# Patient Record
Sex: Female | Born: 1935 | Race: Black or African American | Hispanic: No | Marital: Married | Smoking: Never smoker
Health system: Southern US, Community
[De-identification: ages and names within clinical notes are randomized; demographics above are authoritative.]

## PROBLEM LIST (undated history)

## (undated) ENCOUNTER — Emergency Department (HOSPITAL_COMMUNITY): Admission: EM | Payer: Medicare Other | Source: Home / Self Care

## (undated) DIAGNOSIS — K5090 Crohn's disease, unspecified, without complications: Secondary | ICD-10-CM

## (undated) DIAGNOSIS — I2129 ST elevation (STEMI) myocardial infarction involving other sites: Secondary | ICD-10-CM

## (undated) DIAGNOSIS — F0280 Dementia in other diseases classified elsewhere without behavioral disturbance: Secondary | ICD-10-CM

## (undated) DIAGNOSIS — G309 Alzheimer's disease, unspecified: Secondary | ICD-10-CM

## (undated) DIAGNOSIS — G459 Transient cerebral ischemic attack, unspecified: Secondary | ICD-10-CM

## (undated) DIAGNOSIS — Z87442 Personal history of urinary calculi: Secondary | ICD-10-CM

## (undated) DIAGNOSIS — I10 Essential (primary) hypertension: Secondary | ICD-10-CM

## (undated) HISTORY — PX: TONSILLECTOMY: SUR1361

## (undated) HISTORY — PX: DILATION AND CURETTAGE OF UTERUS: SHX78

## (undated) HISTORY — PX: LAPAROSCOPIC RIGHT HEMI COLECTOMY: SHX5926

## (undated) HISTORY — PX: COLECTOMY: SHX59

---

## 1998-10-05 ENCOUNTER — Ambulatory Visit: Admission: RE | Admit: 1998-10-05 | Discharge: 1998-10-05 | Payer: Self-pay | Admitting: Cardiology

## 1999-04-28 ENCOUNTER — Emergency Department (HOSPITAL_COMMUNITY): Admission: EM | Admit: 1999-04-28 | Discharge: 1999-04-28 | Payer: Self-pay | Admitting: Emergency Medicine

## 2000-06-12 ENCOUNTER — Emergency Department (HOSPITAL_COMMUNITY): Admission: EM | Admit: 2000-06-12 | Discharge: 2000-06-12 | Payer: Self-pay | Admitting: Emergency Medicine

## 2000-06-12 ENCOUNTER — Encounter: Payer: Self-pay | Admitting: Emergency Medicine

## 2000-07-26 ENCOUNTER — Encounter: Payer: Self-pay | Admitting: Cardiology

## 2000-07-26 ENCOUNTER — Ambulatory Visit (HOSPITAL_COMMUNITY): Admission: RE | Admit: 2000-07-26 | Discharge: 2000-07-26 | Payer: Self-pay | Admitting: Cardiology

## 2000-08-30 ENCOUNTER — Observation Stay (HOSPITAL_COMMUNITY): Admission: EM | Admit: 2000-08-30 | Discharge: 2000-08-31 | Payer: Self-pay | Admitting: Emergency Medicine

## 2000-08-30 ENCOUNTER — Encounter: Payer: Self-pay | Admitting: Emergency Medicine

## 2000-08-31 ENCOUNTER — Encounter: Payer: Self-pay | Admitting: Cardiology

## 2001-07-24 ENCOUNTER — Encounter: Payer: Self-pay | Admitting: General Surgery

## 2001-07-24 ENCOUNTER — Encounter: Admission: RE | Admit: 2001-07-24 | Discharge: 2001-07-24 | Payer: Self-pay | Admitting: General Surgery

## 2001-08-01 ENCOUNTER — Encounter (INDEPENDENT_AMBULATORY_CARE_PROVIDER_SITE_OTHER): Payer: Self-pay | Admitting: *Deleted

## 2001-08-01 ENCOUNTER — Ambulatory Visit (HOSPITAL_COMMUNITY): Admission: RE | Admit: 2001-08-01 | Discharge: 2001-08-01 | Payer: Self-pay | Admitting: *Deleted

## 2002-03-09 ENCOUNTER — Emergency Department (HOSPITAL_COMMUNITY): Admission: EM | Admit: 2002-03-09 | Discharge: 2002-03-09 | Payer: Self-pay | Admitting: *Deleted

## 2002-03-09 ENCOUNTER — Encounter: Payer: Self-pay | Admitting: Emergency Medicine

## 2003-12-09 ENCOUNTER — Encounter: Admission: RE | Admit: 2003-12-09 | Discharge: 2003-12-09 | Payer: Self-pay | Admitting: Cardiology

## 2004-06-22 ENCOUNTER — Ambulatory Visit (HOSPITAL_COMMUNITY): Admission: RE | Admit: 2004-06-22 | Discharge: 2004-06-22 | Payer: Self-pay | Admitting: Cardiology

## 2005-06-01 ENCOUNTER — Encounter: Admission: RE | Admit: 2005-06-01 | Discharge: 2005-06-01 | Payer: Self-pay | Admitting: Cardiology

## 2005-07-12 ENCOUNTER — Encounter: Admission: RE | Admit: 2005-07-12 | Discharge: 2005-07-12 | Payer: Self-pay | Admitting: Cardiology

## 2006-09-10 ENCOUNTER — Emergency Department (HOSPITAL_COMMUNITY): Admission: EM | Admit: 2006-09-10 | Discharge: 2006-09-10 | Payer: Self-pay | Admitting: Emergency Medicine

## 2007-03-27 ENCOUNTER — Encounter: Admission: RE | Admit: 2007-03-27 | Discharge: 2007-03-27 | Payer: Self-pay | Admitting: Cardiology

## 2007-09-22 ENCOUNTER — Emergency Department (HOSPITAL_COMMUNITY): Admission: EM | Admit: 2007-09-22 | Discharge: 2007-09-22 | Payer: Self-pay | Admitting: Family Medicine

## 2007-09-29 ENCOUNTER — Encounter: Admission: RE | Admit: 2007-09-29 | Discharge: 2007-09-29 | Payer: Self-pay | Admitting: Cardiology

## 2008-10-15 HISTORY — PX: ESOPHAGOGASTRODUODENOSCOPY: SHX1529

## 2008-11-03 ENCOUNTER — Inpatient Hospital Stay (HOSPITAL_COMMUNITY): Admission: EM | Admit: 2008-11-03 | Discharge: 2008-11-08 | Payer: Self-pay | Admitting: Emergency Medicine

## 2009-03-31 ENCOUNTER — Encounter: Admission: RE | Admit: 2009-03-31 | Discharge: 2009-03-31 | Payer: Self-pay | Admitting: Cardiology

## 2009-12-29 ENCOUNTER — Encounter: Admission: RE | Admit: 2009-12-29 | Discharge: 2009-12-29 | Payer: Self-pay | Admitting: Cardiology

## 2010-10-23 ENCOUNTER — Emergency Department (HOSPITAL_COMMUNITY)
Admission: EM | Admit: 2010-10-23 | Discharge: 2010-10-23 | Payer: Self-pay | Source: Home / Self Care | Admitting: Emergency Medicine

## 2010-10-30 LAB — URINALYSIS, ROUTINE W REFLEX MICROSCOPIC
Bilirubin Urine: NEGATIVE
Hgb urine dipstick: NEGATIVE
Ketones, ur: NEGATIVE mg/dL
Nitrite: NEGATIVE
Protein, ur: NEGATIVE mg/dL
Specific Gravity, Urine: 1.021 (ref 1.005–1.030)
Urine Glucose, Fasting: NEGATIVE mg/dL
Urobilinogen, UA: 0.2 mg/dL (ref 0.0–1.0)
pH: 5.5 (ref 5.0–8.0)

## 2010-10-30 LAB — URINE MICROSCOPIC-ADD ON

## 2010-10-30 LAB — CBC
HCT: 34.4 % — ABNORMAL LOW (ref 36.0–46.0)
Hemoglobin: 11.5 g/dL — ABNORMAL LOW (ref 12.0–15.0)
MCH: 29.2 pg (ref 26.0–34.0)
MCHC: 33.4 g/dL (ref 30.0–36.0)
MCV: 87.3 fL (ref 78.0–100.0)
Platelets: 143 10*3/uL — ABNORMAL LOW (ref 150–400)
RBC: 3.94 MIL/uL (ref 3.87–5.11)
RDW: 15.5 % (ref 11.5–15.5)
WBC: 5.1 10*3/uL (ref 4.0–10.5)

## 2010-10-30 LAB — COMPREHENSIVE METABOLIC PANEL
ALT: 14 U/L (ref 0–35)
AST: 27 U/L (ref 0–37)
Albumin: 3.8 g/dL (ref 3.5–5.2)
Alkaline Phosphatase: 72 U/L (ref 39–117)
BUN: 12 mg/dL (ref 6–23)
CO2: 22 mEq/L (ref 19–32)
Calcium: 9.6 mg/dL (ref 8.4–10.5)
Chloride: 110 mEq/L (ref 96–112)
Creatinine, Ser: 0.88 mg/dL (ref 0.4–1.2)
GFR calc Af Amer: >60 mL/min (ref >60)
GFR calc non Af Amer: >60 mL/min (ref >60)
Glucose, Bld: 81 mg/dL (ref 70–99)
Potassium: 3.6 mEq/L (ref 3.5–5.1)
Sodium: 142 mEq/L (ref 135–145)
Total Bilirubin: 1.1 mg/dL (ref 0.3–1.2)
Total Protein: 6.5 g/dL (ref 6.0–8.3)

## 2010-10-30 LAB — DIFFERENTIAL
Basophils Absolute: 0 10*3/uL (ref 0.0–0.1)
Basophils Relative: 0 % (ref 0–1)
Eosinophils Absolute: 0 10*3/uL (ref 0.0–0.7)
Eosinophils Relative: 1 % (ref 0–5)
Lymphocytes Relative: 35 % (ref 12–46)
Lymphs Abs: 1.8 10*3/uL (ref 0.7–4.0)
Monocytes Absolute: 0.3 10*3/uL (ref 0.1–1.0)
Monocytes Relative: 7 % (ref 3–12)
Neutro Abs: 2.9 10*3/uL (ref 1.7–7.7)
Neutrophils Relative %: 57 % (ref 43–77)

## 2010-10-30 LAB — POCT CARDIAC MARKERS
CKMB, poc: <1 ng/mL — ABNORMAL LOW (ref 1.0–8.0)
Myoglobin, poc: 87.2 ng/mL (ref 12–200)
Troponin i, poc: 0.07 ng/mL (ref 0.00–0.09)

## 2010-10-30 LAB — LIPASE, BLOOD: Lipase: 37 U/L (ref 11–59)

## 2010-11-08 ENCOUNTER — Encounter
Admission: RE | Admit: 2010-11-08 | Discharge: 2010-11-08 | Payer: Self-pay | Source: Home / Self Care | Attending: Gastroenterology | Admitting: Gastroenterology

## 2011-01-16 ENCOUNTER — Ambulatory Visit
Admission: RE | Admit: 2011-01-16 | Discharge: 2011-01-16 | Disposition: A | Discharge status: Home / Self Care | Payer: Medicare Other | Source: Ambulatory Visit | Attending: Cardiology | Admitting: Cardiology

## 2011-01-16 ENCOUNTER — Other Ambulatory Visit: Payer: Self-pay | Admitting: Cardiology

## 2011-01-16 DIAGNOSIS — R55 Syncope and collapse: Secondary | ICD-10-CM

## 2011-01-19 ENCOUNTER — Other Ambulatory Visit: Payer: Medicare Other

## 2011-01-29 LAB — CBC
HCT: 29.9 % — ABNORMAL LOW (ref 36.0–46.0)
HCT: 32.6 % — ABNORMAL LOW (ref 36.0–46.0)
HCT: 32.9 % — ABNORMAL LOW (ref 36.0–46.0)
Hemoglobin: 10 g/dL — ABNORMAL LOW (ref 12.0–15.0)
Hemoglobin: 10.1 g/dL — ABNORMAL LOW (ref 12.0–15.0)
Hemoglobin: 10.7 g/dL — ABNORMAL LOW (ref 12.0–15.0)
Hemoglobin: 11 g/dL — ABNORMAL LOW (ref 12.0–15.0)
MCHC: 33.1 g/dL (ref 30.0–36.0)
MCHC: 33.6 g/dL (ref 30.0–36.0)
MCV: 90.6 fL (ref 78.0–100.0)
MCV: 90.9 fL (ref 78.0–100.0)
MCV: 91.3 fL (ref 78.0–100.0)
Platelets: 133 10*3/uL — ABNORMAL LOW (ref 150–400)
Platelets: 138 10*3/uL — ABNORMAL LOW (ref 150–400)
Platelets: 151 10*3/uL (ref 150–400)
RBC: 3.63 MIL/uL — ABNORMAL LOW (ref 3.87–5.11)
RDW: 14.4 % (ref 11.5–15.5)
RDW: 14.7 % (ref 11.5–15.5)
RDW: 15.1 % (ref 11.5–15.5)
WBC: 4.3 10*3/uL (ref 4.0–10.5)
WBC: 5.2 10*3/uL (ref 4.0–10.5)
WBC: 6.2 10*3/uL (ref 4.0–10.5)

## 2011-01-29 LAB — URINALYSIS, ROUTINE W REFLEX MICROSCOPIC
Bilirubin Urine: NEGATIVE
Ketones, ur: 15 mg/dL — AB
Nitrite: NEGATIVE
Specific Gravity, Urine: 1.02 (ref 1.005–1.030)
Urobilinogen, UA: 0.2 mg/dL (ref 0.0–1.0)

## 2011-01-29 LAB — APTT: aPTT: 29 seconds (ref 24–37)

## 2011-01-29 LAB — BASIC METABOLIC PANEL
CO2: 25 mEq/L (ref 19–32)
Calcium: 8.7 mg/dL (ref 8.4–10.5)
Calcium: 8.7 mg/dL (ref 8.4–10.5)
Creatinine, Ser: 0.97 mg/dL (ref 0.4–1.2)
GFR calc Af Amer: >60 mL/min (ref >60)
GFR calc non Af Amer: 57 mL/min — ABNORMAL LOW (ref >60)
GFR calc non Af Amer: >60 mL/min (ref >60)
GFR calc non Af Amer: >60 mL/min (ref >60)
Glucose, Bld: 90 mg/dL (ref 70–99)
Glucose, Bld: 98 mg/dL (ref 70–99)
Potassium: 3.6 mEq/L (ref 3.5–5.1)
Sodium: 141 mEq/L (ref 135–145)
Sodium: 144 mEq/L (ref 135–145)

## 2011-01-29 LAB — COMPREHENSIVE METABOLIC PANEL
ALT: 12 U/L (ref 0–35)
AST: 24 U/L (ref 0–37)
Albumin: 3.4 g/dL — ABNORMAL LOW (ref 3.5–5.2)
CO2: 22 mEq/L (ref 19–32)
Chloride: 106 mEq/L (ref 96–112)
Creatinine, Ser: 1.21 mg/dL — ABNORMAL HIGH (ref 0.4–1.2)
GFR calc Af Amer: 53 mL/min — ABNORMAL LOW (ref >60)
GFR calc non Af Amer: 44 mL/min — ABNORMAL LOW (ref >60)
Sodium: 139 mEq/L (ref 135–145)
Total Bilirubin: 1.1 mg/dL (ref 0.3–1.2)

## 2011-01-29 LAB — URINE MICROSCOPIC-ADD ON

## 2011-01-29 LAB — DIFFERENTIAL
Basophils Absolute: 0 10*3/uL (ref 0.0–0.1)
Eosinophils Relative: 1 % (ref 0–5)
Lymphocytes Relative: 30 % (ref 12–46)
Lymphs Abs: 1.5 10*3/uL (ref 0.7–4.0)
Neutro Abs: 3.1 10*3/uL (ref 1.7–7.7)
Neutrophils Relative %: 60 % (ref 43–77)

## 2011-01-29 LAB — TYPE AND SCREEN: ABO/RH(D): O POS

## 2011-01-29 LAB — ABO/RH: ABO/RH(D): O POS

## 2011-02-12 ENCOUNTER — Other Ambulatory Visit: Payer: Self-pay | Admitting: Gastroenterology

## 2011-02-14 ENCOUNTER — Ambulatory Visit
Admission: RE | Admit: 2011-02-14 | Discharge: 2011-02-14 | Disposition: A | Discharge status: Home / Self Care | Payer: Medicare Other | Source: Ambulatory Visit | Attending: Gastroenterology | Admitting: Gastroenterology

## 2011-02-14 MED ORDER — IOHEXOL 300 MG/ML  SOLN
100.0000 mL | Freq: Once | INTRAMUSCULAR | Status: AC | PRN
  Administered 2011-02-14: 100 mL via INTRAVENOUS

## 2011-02-27 NOTE — Op Note (Signed)
[name], CURVIN                ACCOUNT NO.:  [id number]   MEDICAL RECORD NO.:  [id number]          PATIENT TYPE:  INP   LOCATION:  7672                         FACILITY:  [facility]   PHYSICIAN:  Wonda Horner, M.D.   DATE OF BIRTH:  1935/03/02   DATE OF PROCEDURE:  11/04/2008  DATE OF DISCHARGE:                               OPERATIVE REPORT   INDICATIONS:  Epigastric pain, GI bleeding.   Informed consent was obtained after explanation of the risks of  bleeding, infection, and perforation.   PREMEDICATION:  Fentanyl 50 mcg IV and Versed 4 mg IV.   PROCEDURE IN DETAIL:  With the patient in the left lateral decubitus  position, the Pentax gastroscope was inserted into the oropharynx and  passed into the esophagus.  It was advanced down the esophagus, then  into the stomach and into the duodenum.  The second portion and bulb of  the duodenum looked normal.  The stomach looked normal in its entirety.  The esophagus looked normal in its entirety.  The esophagogastric  junction was at 37 cm.  She tolerated the procedure well without  complications.   IMPRESSION:  Normal esophagogastroduodenoscopy.   There is nothing on this exam to explain her gastrointestinal bleeding.  I will schedule her for a colonoscopy.           ______________________________  Wonda Horner, M.D.     SFG/MEDQ  D:  11/04/2008  T:  11/04/2008  Job:  [id number]   cc:   Ardyth Gal. Spruill, M.D.

## 2011-02-27 NOTE — Op Note (Signed)
[name], [name]                ACCOUNT NO.:  [id number]   MEDICAL RECORD NO.:  [id number]          PATIENT TYPE:  INP   LOCATION:  2542                         FACILITY:  [facility]   PHYSICIAN:  Wonda Horner, M.D.   DATE OF BIRTH:  [birth year]   DATE OF PROCEDURE:  11/05/2008  DATE OF DISCHARGE:                               OPERATIVE REPORT   INDICATIONS FOR PROCEDURE:  Gastrointestinal bleeding.   Informed consent was obtained after explanation of the risks of  bleeding, infection, and perforation.   PREMEDICATION:  Fentanyl 100 mcg IV and Versed 6 mg IV.   PROCEDURE:  With the patient in the left lateral decubitus position, a  rectal exam was performed and no masses were felt.  The Pentax  colonoscope was inserted into the rectum and advanced into the sigmoid  colon.  The left colon was very tortuous.  The colon was looped.  The  patient has had prior right hemicolectomy back in the 1970s.  I suspect  that she has adhesions.  The scope was advanced up into the descending  colon.  I saw one little diverticulum in the region of the sigmoid, but  none in the descending colon.  The transverse colon seemed to loop down  and I could not advance the scope all the way to the anastomosis site.  She tolerated the procedure well without complications.   IMPRESSION:  Diverticulum.  No evidence of active bleeding.   PLAN:  We will obtain a barium enema to evaluate the rest of the colon.           ______________________________  Wonda Horner, M.D.     SFG/MEDQ  D:  11/05/2008  T:  11/06/2008  Job:  [id number]   cc:   Ardyth Gal. Spruill, M.D.

## 2011-02-27 NOTE — Consult Note (Signed)
NAME:  [name], [name] NO.:  [id number]   MEDICAL RECORD NO.:  [id number]          PATIENT TYPE:  INP   LOCATION:  3568                         FACILITY:  [facility]   PHYSICIAN:  Wonda Horner, M.D.   DATE OF BIRTH:  [birth year]   DATE OF CONSULTATION:  11/03/2008  DATE OF DISCHARGE:                                 CONSULTATION   REASON FOR CONSULTATION:  The patient is a 76 year old black female with  complaints of epigastric abdominal pain for the past 2 weeks.  She was  put on Prevacid a couple of weeks ago for this pain and it helped for a  while, but then the pain started to come back.  Today, she experienced  dark-colored stool with some reddish blood also, and she came to the  emergency room where she was seen by Dr. Montez Morita and is being admitted.   Her hemoglobin and hematocrit are 11.5 and 34.2 respectively.   She has not vomited any blood.   PAST MEDICAL HISTORY:  1. Hypertension.  2. Remote history of Crohn disease with a right colectomy done in      1972, no treatment since.  3. History of gastritis.   ALLERGIES:  SULFA and CODEINE.   SOCIAL HISTORY:  No history of alcohol or smoking.   REVIEW OF SYSTEMS:  No chest pain, shortness of breath, cough, or sputum  production.   PHYSICAL EXAMINATION:  VITAL SIGNS:  Stable.  GENERAL:  She is in no distress.  NECK:  Supple.  HEART:  Regular rhythm.  No murmurs.  LUNGS:  Clear.  ABDOMEN:  Soft.  There is some tenderness in the epigastrium.  There is  no rebound or guarding.  No hepatosplenomegaly.   IMPRESSION:  1. Gastrointestinal bleeding.  I suspect that this is of an upper      source.  2. Epigastric pain, rule out peptic ulcer.   PLAN:  The patient is being admitted.  We will start her on IV Protonix.  I will plan EGD tomorrow.           ______________________________  Wonda Horner, M.D.     SFG/MEDQ  D:  11/03/2008  T:  11/04/2008  Job:  [id number]   cc:   Ardyth Gal. Spruill, M.D.

## 2011-03-21 ENCOUNTER — Other Ambulatory Visit (HOSPITAL_COMMUNITY): Payer: Self-pay | Admitting: Obstetrics

## 2011-03-21 DIAGNOSIS — N83209 Unspecified ovarian cyst, unspecified side: Secondary | ICD-10-CM

## 2011-03-22 ENCOUNTER — Ambulatory Visit (HOSPITAL_COMMUNITY)
Admission: RE | Admit: 2011-03-22 | Discharge: 2011-03-22 | Disposition: A | Discharge status: Home / Self Care | Payer: Medicare Other | Source: Ambulatory Visit | Attending: Obstetrics | Admitting: Obstetrics

## 2011-03-22 DIAGNOSIS — N83209 Unspecified ovarian cyst, unspecified side: Secondary | ICD-10-CM | POA: Insufficient documentation

## 2011-03-22 DIAGNOSIS — N949 Unspecified condition associated with female genital organs and menstrual cycle: Secondary | ICD-10-CM | POA: Insufficient documentation

## 2011-03-22 LAB — CREATININE, SERUM: GFR calc non Af Amer: >60 mL/min (ref >60)

## 2011-03-22 MED ORDER — GADOBENATE DIMEGLUMINE 529 MG/ML IV SOLN
9.0000 mL | Freq: Once | INTRAVENOUS | Status: AC | PRN
  Administered 2011-03-22: 9 mL via INTRAVENOUS

## 2012-02-19 ENCOUNTER — Encounter (HOSPITAL_COMMUNITY): Payer: Self-pay | Admitting: *Deleted

## 2012-02-19 ENCOUNTER — Emergency Department (HOSPITAL_COMMUNITY)
Admission: EM | Admit: 2012-02-19 | Discharge: 2012-02-19 | Disposition: A | Discharge status: Home / Self Care | Payer: Medicare Other | Attending: Emergency Medicine | Admitting: Emergency Medicine

## 2012-02-19 ENCOUNTER — Emergency Department (HOSPITAL_COMMUNITY): Payer: Medicare Other

## 2012-02-19 DIAGNOSIS — R5381 Other malaise: Secondary | ICD-10-CM | POA: Insufficient documentation

## 2012-02-19 DIAGNOSIS — R51 Headache: Secondary | ICD-10-CM | POA: Insufficient documentation

## 2012-02-19 DIAGNOSIS — G319 Degenerative disease of nervous system, unspecified: Secondary | ICD-10-CM | POA: Insufficient documentation

## 2012-02-19 DIAGNOSIS — I771 Stricture of artery: Secondary | ICD-10-CM | POA: Insufficient documentation

## 2012-02-19 DIAGNOSIS — M436 Torticollis: Secondary | ICD-10-CM | POA: Insufficient documentation

## 2012-02-19 DIAGNOSIS — R110 Nausea: Secondary | ICD-10-CM | POA: Insufficient documentation

## 2012-02-19 DIAGNOSIS — R42 Dizziness and giddiness: Secondary | ICD-10-CM | POA: Insufficient documentation

## 2012-02-19 DIAGNOSIS — I10 Essential (primary) hypertension: Secondary | ICD-10-CM | POA: Insufficient documentation

## 2012-02-19 DIAGNOSIS — Z79899 Other long term (current) drug therapy: Secondary | ICD-10-CM | POA: Insufficient documentation

## 2012-02-19 HISTORY — DX: Essential (primary) hypertension: I10

## 2012-02-19 HISTORY — DX: Crohn's disease, unspecified, without complications: K50.90

## 2012-02-19 LAB — BASIC METABOLIC PANEL
CO2: 23 mEq/L (ref 19–32)
Calcium: 9.6 mg/dL (ref 8.4–10.5)
Chloride: 107 mEq/L (ref 96–112)
Creatinine, Ser: 0.87 mg/dL (ref 0.50–1.10)
Glucose, Bld: 79 mg/dL (ref 70–99)

## 2012-02-19 LAB — CBC
HCT: 33.8 % — ABNORMAL LOW (ref 36.0–46.0)
Hemoglobin: 11.6 g/dL — ABNORMAL LOW (ref 12.0–15.0)
MCV: 88.9 fL (ref 78.0–100.0)
RBC: 3.8 MIL/uL — ABNORMAL LOW (ref 3.87–5.11)
WBC: 5.7 10*3/uL (ref 4.0–10.5)

## 2012-02-19 LAB — URINALYSIS, ROUTINE W REFLEX MICROSCOPIC
Bilirubin Urine: NEGATIVE
Glucose, UA: NEGATIVE mg/dL
Ketones, ur: NEGATIVE mg/dL
Leukocytes, UA: NEGATIVE
Nitrite: NEGATIVE
Specific Gravity, Urine: 1.015 (ref 1.005–1.030)
pH: 5.5 (ref 5.0–8.0)

## 2012-02-19 LAB — DIFFERENTIAL
Basophils Absolute: 0 10*3/uL (ref 0.0–0.1)
Eosinophils Relative: 1 % (ref 0–5)
Lymphocytes Relative: 33 % (ref 12–46)
Lymphs Abs: 1.9 10*3/uL (ref 0.7–4.0)
Monocytes Absolute: 0.4 10*3/uL (ref 0.1–1.0)
Monocytes Relative: 7 % (ref 3–12)
Neutro Abs: 3.4 10*3/uL (ref 1.7–7.7)

## 2012-02-19 LAB — SEDIMENTATION RATE: Sed Rate: 16 mm/hr (ref 0–22)

## 2012-02-19 MED ORDER — IOHEXOL 350 MG/ML SOLN
50.0000 mL | Freq: Once | INTRAVENOUS | Status: AC | PRN
  Administered 2012-02-19: 50 mL via INTRAVENOUS

## 2012-02-19 MED ORDER — METOCLOPRAMIDE HCL 5 MG/ML IJ SOLN
10.0000 mg | Freq: Once | INTRAMUSCULAR | Status: AC
  Administered 2012-02-19: 10 mg via INTRAVENOUS
  Filled 2012-02-19: qty 2

## 2012-02-19 MED ORDER — DIPHENHYDRAMINE HCL 50 MG/ML IJ SOLN
25.0000 mg | Freq: Once | INTRAMUSCULAR | Status: AC
  Administered 2012-02-19: 25 mg via INTRAVENOUS
  Filled 2012-02-19: qty 1

## 2012-02-19 NOTE — ED Notes (Signed)
Pt ambulates app 100 yards in NAD. Pt ambulates without assistance, but statesshe feels she is "veering to the left" EDP aware

## 2012-02-19 NOTE — ED Provider Notes (Signed)
History     CSN: [id number]  Arrival date & time 02/19/12  1139   First MD Initiated Contact with Patient 02/19/12 1325      Chief Complaint  Patient presents with  . Headache  . Nausea  . Weakness  . Dizziness    (Consider location/radiation/quality/duration/timing/severity/associated sxs/prior treatment) HPI history present illness chief complaint: headache. Patient arrived by private vehicle. History provided by patient. No language barriers identified. Information not limited. Onset of symptoms 9:30 AM. Location of pain generalized head. Symptoms improving spontaneously and not worsened by anything. Quality dull. Radiation none. Severity moderate to severe. Timing constant. Duration since 9:30 AM. Context the patient states this is possibly the worst headache of her life. Patient denies any recent trauma. For associated signs and symptoms please refer to the review of systems. No treatments prior prior to arrival. No recent medical care. Regarding patient's social history please refer to the nurse's notes. No family history for stroke. Positive family history of glioblastoma. I reviewed patient's past medical, past surgical, social history as well as medications and allergies per  Past Medical History  Diagnosis Date  . Hypertension   . Crohn disease     Past Surgical History  Procedure Date  . Colectomy     No family history on file.  History  Substance Use Topics  . Smoking status: Never Smoker   . Smokeless tobacco: Not on file  . Alcohol Use: No    OB History    Grav Para Term Preterm Abortions TAB SAB Ect Mult Living                  Review of Systems  Constitutional: Negative for fever and chills.  HENT: Positive for neck stiffness. Negative for trouble swallowing and neck pain.   Eyes: Negative for pain, discharge and itching.  Respiratory: Negative for cough, chest tightness and shortness of breath.   Cardiovascular: Negative for chest pain, palpitations  and leg swelling.  Gastrointestinal: Positive for nausea. Negative for vomiting, abdominal pain, diarrhea, constipation and blood in stool.  Genitourinary: Negative for dysuria, urgency, frequency, hematuria, flank pain, decreased urine volume, difficulty urinating and pelvic pain.  Musculoskeletal: Negative for back pain and joint swelling.  Skin: Negative for rash and wound.  Neurological: Positive for headaches. Negative for dizziness, tremors, seizures, syncope, facial asymmetry, speech difficulty, weakness, light-headedness and numbness.  Hematological: Negative for adenopathy. Does not bruise/bleed easily.  Psychiatric/Behavioral: Negative for confusion and decreased concentration.    Allergies  Sulfa antibiotics and Codeine  Home Medications   Current Outpatient Rx  Name Route Sig Dispense Refill  . AMLODIPINE BESYLATE 5 MG PO TABS Oral Take 5 mg by mouth daily.    . ASPIRIN 81 MG PO CHEW Oral Chew 81 mg by mouth daily.      BP 173/91  Pulse 74  Temp(Src) 97.9 F (36.6 C) (Oral)  Resp 14  Ht 5' 3"  (1.6 m)  Wt 105 lb (47.628 kg)  BMI 18.60 kg/m2  SpO2 100%  Physical Exam  Constitutional: She is oriented to person, place, and time. She appears well-developed and well-nourished. No distress.  HENT:  Head: Normocephalic and atraumatic.  Eyes: Conjunctivae and EOM are normal. Pupils are equal, round, and reactive to light. Right eye exhibits no discharge. Left eye exhibits no discharge. No scleral icterus.  Neck: Normal range of motion. Neck supple.  Cardiovascular: Normal rate, regular rhythm, normal heart sounds and intact distal pulses.   No murmur heard. Pulmonary/Chest: Effort  normal and breath sounds normal. No respiratory distress. She has no wheezes. She has no rales. She exhibits no tenderness.  Abdominal: Soft. Bowel sounds are normal. She exhibits no distension and no mass. There is no tenderness. There is no rebound and no guarding.  Musculoskeletal: Normal  range of motion. She exhibits no tenderness.  Neurological: She is alert and oriented to person, place, and time. She has normal strength. No cranial nerve deficit or sensory deficit. Coordination and gait normal. GCS eye subscore is 4. GCS verbal subscore is 5. GCS motor subscore is 6.       Patient not cooperative with head impulse testing. No bidirectional nystagmus. No skewed deviation with cover testing. Finger to nose testing normal. Alternating hand movements is normal. Heel-to-shin testing normal.  Skin: Skin is warm and dry. She is not diaphoretic.  Psychiatric: She has a normal mood and affect.    ED Course  Procedures (including critical care time)  Labs Reviewed  CBC - Abnormal; Notable for the following:    RBC 3.80 (*)    Hemoglobin 11.6 (*)    HCT 33.8 (*)    Platelets 142 (*)    All other components within normal limits  BASIC METABOLIC PANEL - Abnormal; Notable for the following:    GFR calc non Af Amer 64 (*)    GFR calc Af Amer 74 (*)    All other components within normal limits  DIFFERENTIAL  SEDIMENTATION RATE  URINALYSIS, ROUTINE W REFLEX MICROSCOPIC   Ct Angio Head W/cm &/or Wo Cm  02/19/2012  *RADIOLOGY REPORT*  Clinical Data:  77 year old female with sudden onset worst headache of life.  Nausea weakness and dizziness.  CT ANGIOGRAPHY HEAD AND NECK  Technique:  Multidetector CT imaging of the head and neck was performed using the standard protocol during bolus administration of intravenous contrast.  Multiplanar CT image reconstructions including MIPs were obtained to evaluate the vascular anatomy. Carotid stenosis measurements (when applicable) are obtained utilizing NASCET criteria, using the distal internal carotid diameter as the denominator.  Contrast: 44m OMNIPAQUE IOHEXOL 350 MG/ML SOLN  Comparison:  Head CTs without contrast 02/19/2012 and earlier.  CTA NECK  Findings:  Negative lung apices.  Negative superior mediastinum, thyroid, larynx, pharynx,  parapharyngeal spaces, retropharyngeal space, sublingual space, submandibular glands and parotid glands. Visualized orbit soft tissues are within normal limits.  No cervical lymphadenopathy is evident.  Exaggerated cervical lordosis.  Otherwise mild for age cervical degenerative changes. No acute osseous abnormality identified.  Visualized paranasal sinuses and mastoids are clear.  Vascular Findings: Bovine arch configuration.  Minimal arch atherosclerosis.  No stenosis of the great vessel origins.  Normal right common carotid artery, right carotid bifurcation, and right ICA origin.  Cervical right ICA is normal aside from tortuosity.  Normal right vertebral artery origin.  The cervical right vertebral artery is normal aside from tortuosity.  Normal left common carotid artery aside from tortuosity.  Normal left carotid bifurcation and left ICA origin.  Minimal left cervical ICA tortuosity.  Normal left vertebral artery origin aside from tortuosity.  The left vertebral artery is mildly dominant throughout the neck.  It is normal aside from tortuosity.   Review of the MIP images confirms the above findings.  IMPRESSION: 1.  Negative neck CTA except for vessel tortuosity.  Minimal aortic arch atherosclerosis. 2.  No acute findings in the neck.  See intracranial findings below.  CTA HEAD  Findings:  Calvarium intact. No acute osseous abnormality identified.  Visualized scalp  soft tissues are within normal limits.  No ventriculomegaly. No midline shift, mass effect, or evidence of mass lesion.  No intracranial hemorrhage is evident. Stable gray-white matter differentiation throughout the brain.  No abnormal enhancement identified.  Vascular Findings: Major intracranial venous structures are enhancing.  Mildly dominant distal left vertebral artery.  No distal vertebral artery atherosclerosis.  Normal PICA vessels.  No basilar artery atherosclerosis or stenosis.  Normal superior cerebellar artery and PCA origins.   Posterior communicating arteries are diminutive.  PCA branches are within normal limits.  Both ICA siphons are widely patent.  Mild cavernous calcified atherosclerosis on the right.  Both ophthalmic artery origins are within normal limits.  The right posterior communicating artery origin is within normal limits.  The left posterior communicating artery origin is more prominent and includes what appears to be a small infundibulum (series 80,560 image 191).  Normal carotid termini.  Normal MCA and ACA origins.  Bilateral MCA branches are within normal limits.  The ACA does have an azygos configuration.  At the anterior communicating artery, there is ectasia (attempted fenestration type appearance) as well as mild prominence of the origins of the bilateral frontal polar branches.  There is no discrete aneurysm formation.  The ACA then bifurcated at the level of the frontal horns.  The ACA branches are within normal limits.   Review of the MIP images confirms the above findings.  IMPRESSION: 1.  Azygos ACA configuration, and ectatic anterior communicating artery region, but no discrete aneurysm identified. 2.  Left posterior communicating artery origin infundibulum. 3.  Otherwise negative intracranial CTA. No acute intracranial abnormality.  Original Report Authenticated By: Randall An, M.D.   Ct Head Wo Contrast  02/19/2012  *RADIOLOGY REPORT*  Clinical Data: Sudden onset of headache.  Nausea, weakness and dizziness.  CT HEAD WITHOUT CONTRAST  Technique:  Contiguous axial images were obtained from the base of the skull through the vertex without contrast.  Comparison: 01/16/2011.  Findings: No intracranial hemorrhage.  Small vessel disease type changes without CT evidence of large acute infarct.  Right cerebellar tonsil minimally low-lying but within the range normal limits.  Prominent appearance of the A2 segment of the anterior cerebral artery may represent an azygos configuration.  Aneurysm not entirely  excluded.  Appearance is without change.  No intracranial mass lesion detected on this unenhanced exam.  Global atrophy without hydrocephalus.  IMPRESSION: No intracranial hemorrhage.  Small vessel disease type changes without CT evidence of large acute infarct.  Prominent appearance of the A2 segment of the anterior cerebral artery may represent an azygos configuration.  Aneurysm not entirely excluded.  Appearance is without change.  Global atrophy without hydrocephalus  Original Report Authenticated By: Doug Sou, M.D.   Ct Angio Neck W/cm &/or Wo/cm  02/19/2012  *RADIOLOGY REPORT*  Clinical Data:  77 year old female with sudden onset worst headache of life.  Nausea weakness and dizziness.  CT ANGIOGRAPHY HEAD AND NECK  Technique:  Multidetector CT imaging of the head and neck was performed using the standard protocol during bolus administration of intravenous contrast.  Multiplanar CT image reconstructions including MIPs were obtained to evaluate the vascular anatomy. Carotid stenosis measurements (when applicable) are obtained utilizing NASCET criteria, using the distal internal carotid diameter as the denominator.  Contrast: 35m OMNIPAQUE IOHEXOL 350 MG/ML SOLN  Comparison:  Head CTs without contrast 02/19/2012 and earlier.  CTA NECK  Findings:  Negative lung apices.  Negative superior mediastinum, thyroid, larynx, pharynx, parapharyngeal spaces, retropharyngeal space, sublingual  space, submandibular glands and parotid glands. Visualized orbit soft tissues are within normal limits.  No cervical lymphadenopathy is evident.  Exaggerated cervical lordosis.  Otherwise mild for age cervical degenerative changes. No acute osseous abnormality identified.  Visualized paranasal sinuses and mastoids are clear.  Vascular Findings: Bovine arch configuration.  Minimal arch atherosclerosis.  No stenosis of the great vessel origins.  Normal right common carotid artery, right carotid bifurcation, and right ICA origin.   Cervical right ICA is normal aside from tortuosity.  Normal right vertebral artery origin.  The cervical right vertebral artery is normal aside from tortuosity.  Normal left common carotid artery aside from tortuosity.  Normal left carotid bifurcation and left ICA origin.  Minimal left cervical ICA tortuosity.  Normal left vertebral artery origin aside from tortuosity.  The left vertebral artery is mildly dominant throughout the neck.  It is normal aside from tortuosity.   Review of the MIP images confirms the above findings.  IMPRESSION: 1.  Negative neck CTA except for vessel tortuosity.  Minimal aortic arch atherosclerosis. 2.  No acute findings in the neck.  See intracranial findings below.  CTA HEAD  Findings:  Calvarium intact. No acute osseous abnormality identified.  Visualized scalp soft tissues are within normal limits.  No ventriculomegaly. No midline shift, mass effect, or evidence of mass lesion.  No intracranial hemorrhage is evident. Stable gray-white matter differentiation throughout the brain.  No abnormal enhancement identified.  Vascular Findings: Major intracranial venous structures are enhancing.  Mildly dominant distal left vertebral artery.  No distal vertebral artery atherosclerosis.  Normal PICA vessels.  No basilar artery atherosclerosis or stenosis.  Normal superior cerebellar artery and PCA origins.  Posterior communicating arteries are diminutive.  PCA branches are within normal limits.  Both ICA siphons are widely patent.  Mild cavernous calcified atherosclerosis on the right.  Both ophthalmic artery origins are within normal limits.  The right posterior communicating artery origin is within normal limits.  The left posterior communicating artery origin is more prominent and includes what appears to be a small infundibulum (series 80,560 image 191).  Normal carotid termini.  Normal MCA and ACA origins.  Bilateral MCA branches are within normal limits.  The ACA does have an azygos  configuration.  At the anterior communicating artery, there is ectasia (attempted fenestration type appearance) as well as mild prominence of the origins of the bilateral frontal polar branches.  There is no discrete aneurysm formation.  The ACA then bifurcated at the level of the frontal horns.  The ACA branches are within normal limits.   Review of the MIP images confirms the above findings.  IMPRESSION: 1.  Azygos ACA configuration, and ectatic anterior communicating artery region, but no discrete aneurysm identified. 2.  Left posterior communicating artery origin infundibulum. 3.  Otherwise negative intracranial CTA. No acute intracranial abnormality.  Original Report Authenticated By: Randall An, M.D.     1. Headache   2. Dizzy       Date: 02/19/2012  Rate: 62  Rhythm: normal sinus rhythm  QRS Axis: normal  Intervals: normal  ST/T Wave abnormalities: nonspecific T wave changes  Conduction Disutrbances:none  Narrative Interpretation:   Old EKG Reviewed: Diffuse T-wave flattening noted to be old.   MDM  Pt is a well-appearing 77 year old African female past medical history of hypertension who presents with severe headache starting at 9:30 AM approximately 6 hours ago as well as some mild neck stiffness. Headache improving spontaneously. Mildly hypertensive in triage but blood  pressure approving while in the emergency department. Approximately 086 systolic at time of exam. No recent head trauma. No focal neurologic deficits on exam. Cerebellar testing normal. Head CT performed in triage unremarkable but reports states unable to rule out anterior cerebral artery aneurysm. CT angiogram head and neck ordered. Headache improving spontaneously. Labs large unremarkable.  CT head and neck unremarkable as well. Headache improved radically without treatment and then resolved after treatment. Dizziness resolved while in the emergency department. Patient did ambulate well in the hallways without  assistance. Patient denied any abnormal feelings of vertigo or dizziness when ambulating. Patient discharged home in stable condition with PCP followup. Should return precautions discussed such as return of headache, return of dizziness, difficulty walking, or any other symptoms of concern. Patient verbalized understanding.      Charlotte Sanes, MD 02/19/12 [phone]

## 2012-02-19 NOTE — ED Notes (Signed)
Pt given discharge and follow up instructions without further questions after speaking with EDP. Denies further needs. Ambulates to lobby in NAD

## 2012-02-19 NOTE — ED Provider Notes (Signed)
Medical screening exam: And 77 year old female had sudden onset this morning of a global headache which is severe. It is associated with a vague sense of dizziness which isn't able to describe. There's been nausea but no vomiting. She has noted that she was off balance. Limited exam shows pupils are equal and reactive and EOMs are full. No facial asymmetry is seen. Head movement does not reproduce her dizziness. There no carotid bruits. Heart has regular rate and rhythm with a 2/6 midsystolic murmur heard along the sternal border. There no gross motor deficits no gross cranial nerve deficits. She has tremor on finger to nose testing which is slightly more prominent on the right than on the left. Gait was not tested. Initial workup is ordered and she will be moved to the main part of the emergency department for more detailed evaluation.  Delora Fuel, MD 06/34/94 [id number]

## 2012-02-19 NOTE — ED Notes (Signed)
Pt returned from CT °

## 2012-02-19 NOTE — ED Notes (Signed)
Patient reports sudden onset of headache with nausea, dizziness, and weakness.  Patient states this is the worst headache she has ever experienced.  Speech is clear.  She states she feels like she cannot balance.  Patient states her headache is now in the back of her neck.  Patient denies chest pain.

## 2012-02-19 NOTE — ED Provider Notes (Signed)
I saw and evaluated the patient, reviewed the resident's note and I agree with the findings and plan.  Pt does not appear to be in any distress at this time.  Does still complain of a headache however.  ? findings on the CT scan.  Will proceed with CTA.  Will discuss with patient possible LP for evaluation of SAH.  Doubt meningitis.  Doubt CVA although will continue to monitor.  Kathalene Frames, MD 02/19/12 [phone]

## 2012-02-19 NOTE — Discharge Instructions (Signed)
Dizziness Dizziness is a common problem. It is a feeling of unsteadiness or lightheadedness. You may feel like you are about to faint. Dizziness can lead to injury if you stumble or fall. A person of any age group can suffer from dizziness, but dizziness is more common in older adults. CAUSES  Dizziness can be caused by many different things, including:  Middle ear problems.   Standing for too long.   Infections.   An allergic reaction.   Aging.   An emotional response to something, such as the sight of blood.   Side effects of medicines.   Fatigue.   Problems with circulation or blood pressure.   Excess use of alcohol, medicines, or illegal drug use.   Breathing too fast (hyperventilation).   An arrhythmia or problems with your heart rhythm.   Low red blood cell count (anemia).   Pregnancy.   Vomiting, diarrhea, fever, or other illnesses that cause dehydration.   Diseases or conditions such as Parkinson's disease, high blood pressure (hypertension), diabetes, and thyroid problems.   Exposure to extreme heat.  DIAGNOSIS  To find the cause of your dizziness, your caregiver may do a physical exam, lab tests, radiologic imaging scans, or an electrocardiography test (ECG).  TREATMENT  Treatment of dizziness depends on the cause of your symptoms and can vary greatly. HOME CARE INSTRUCTIONS   Drink enough fluids to keep your urine clear or pale yellow. This is especially important in very hot weather. In the elderly, it is also important in cold weather.   If your dizziness is caused by medicines, take them exactly as directed. When taking blood pressure medicines, it is especially important to get up slowly.   Rise slowly from chairs and steady yourself until you feel okay.   In the morning, first sit up on the side of the bed. When this seems okay, stand slowly while holding onto something until you know your balance is fine.   If you need to stand in one place for a  long time, be sure to move your legs often. Tighten and relax the muscles in your legs while standing.   If dizziness continues to be a problem, have someone stay with you for a day or two. Do this until you feel you are well enough to stay alone. Have the person call your caregiver if he or she notices changes in you that are concerning.   Do not drive or use heavy machinery if you feel dizzy.  SEEK IMMEDIATE MEDICAL CARE IF:   Your dizziness or lightheadedness gets worse.   You feel nauseous or vomit.   You develop problems with talking, walking, weakness, or using your arms, hands, or legs.   You are not thinking clearly or you have difficulty forming sentences. It may take a friend or family member to determine if your thinking is normal.   You develop chest pain, abdominal pain, shortness of breath, or sweating.   Your vision changes.   You notice any bleeding.   You have side effects from medicine that seems to be getting worse rather than better.  MAKE SURE YOU:   Understand these instructions.   Will watch your condition.   Will get help right away if you are not doing well or get worse.  Document Released: 03/27/2001 Document Revised: 09/20/2011 Document Reviewed: 04/20/2011 [facility] Patient Information 2012 [place].  Headache, General, Unknown Cause The specific cause of your headache may not have been found today. There are many  causes and types of headache. A few common ones are:  Tension headache.   Migraine.   Infections (examples: dental and sinus infections).   Bone and/or joint problems in the neck or jaw.   Depression.   Eye problems.  These headaches are not life threatening.  Headaches can sometimes be diagnosed by a patient history and a physical exam. Sometimes, lab and imaging studies (such as x-ray and/or CT scan) are used to rule out more serious problems. In some cases, a spinal tap (lumbar puncture) may be requested. There are many  times when your exam and tests may be normal on the first visit even when there is a serious problem causing your headaches. Because of that, it is very important to follow up with your doctor or local clinic for further evaluation. FINDING OUT THE RESULTS OF TESTS  If a radiology test was performed, a radiologist will review your results.   You will be contacted by the emergency department or your physician if any test results require a change in your treatment plan.   Not all test results may be available during your visit. If your test results are not back during the visit, make an appointment with your caregiver to find out the results. Do not assume everything is normal if you have not heard from your caregiver or the medical facility. It is important for you to follow up on all of your test results.  HOME CARE INSTRUCTIONS   Keep follow-up appointments with your caregiver, or any specialist referral.   Only take over-the-counter or prescription medicines for pain, discomfort, or fever as directed by your caregiver.   Biofeedback, massage, or other relaxation techniques may be helpful.   Ice packs or heat applied to the head and neck can be used. Do this three to four times per day, or as needed.   Call your doctor if you have any questions or concerns.   If you smoke, you should quit.  SEEK MEDICAL CARE IF:   You develop problems with medications prescribed.   You do not respond to or obtain relief from medications.   You have a change from the usual headache.   You develop nausea or vomiting.  SEEK IMMEDIATE MEDICAL CARE IF:   If your headache becomes severe.   You have an unexplained oral temperature above 102 F (38.9 C), or as your caregiver suggests.   You have a stiff neck.   You have loss of vision.   You have muscular weakness.   You have loss of muscular control.   You develop severe symptoms different from your first symptoms.   You start losing your  balance or have trouble walking.   You feel faint or pass out.  MAKE SURE YOU:   Understand these instructions.   Will watch your condition.   Will get help right away if you are not doing well or get worse.  Document Released: 10/01/2005 Document Revised: 09/20/2011 Document Reviewed: 05/20/2008 [facility] Patient Information 2012 [place].

## 2013-04-23 ENCOUNTER — Other Ambulatory Visit: Payer: Self-pay

## 2013-04-23 ENCOUNTER — Encounter (HOSPITAL_COMMUNITY): Payer: Self-pay | Admitting: Emergency Medicine

## 2013-04-23 ENCOUNTER — Emergency Department (HOSPITAL_COMMUNITY)
Admission: EM | Admit: 2013-04-23 | Discharge: 2013-04-24 | Disposition: A | Discharge status: Home / Self Care | Payer: Medicare Other | Attending: Emergency Medicine | Admitting: Emergency Medicine

## 2013-04-23 DIAGNOSIS — I10 Essential (primary) hypertension: Secondary | ICD-10-CM | POA: Insufficient documentation

## 2013-04-23 DIAGNOSIS — R195 Other fecal abnormalities: Secondary | ICD-10-CM | POA: Insufficient documentation

## 2013-04-23 DIAGNOSIS — R110 Nausea: Secondary | ICD-10-CM | POA: Insufficient documentation

## 2013-04-23 DIAGNOSIS — R109 Unspecified abdominal pain: Secondary | ICD-10-CM | POA: Insufficient documentation

## 2013-04-23 DIAGNOSIS — Z7982 Long term (current) use of aspirin: Secondary | ICD-10-CM | POA: Insufficient documentation

## 2013-04-23 DIAGNOSIS — Z79899 Other long term (current) drug therapy: Secondary | ICD-10-CM | POA: Insufficient documentation

## 2013-04-23 DIAGNOSIS — Z8719 Personal history of other diseases of the digestive system: Secondary | ICD-10-CM | POA: Insufficient documentation

## 2013-04-23 LAB — URINALYSIS, ROUTINE W REFLEX MICROSCOPIC
Ketones, ur: NEGATIVE mg/dL
Nitrite: NEGATIVE
Protein, ur: 30 mg/dL — AB
pH: 5.5 (ref 5.0–8.0)

## 2013-04-23 LAB — COMPREHENSIVE METABOLIC PANEL
Albumin: 3.6 g/dL (ref 3.5–5.2)
Alkaline Phosphatase: 87 U/L (ref 39–117)
BUN: 15 mg/dL (ref 6–23)
CO2: 28 mEq/L (ref 19–32)
Chloride: 104 mEq/L (ref 96–112)
Creatinine, Ser: 0.92 mg/dL (ref 0.50–1.10)
GFR calc non Af Amer: 59 mL/min — ABNORMAL LOW (ref >90)
Glucose, Bld: 92 mg/dL (ref 70–99)
Potassium: 3.5 mEq/L (ref 3.5–5.1)
Total Bilirubin: 0.8 mg/dL (ref 0.3–1.2)

## 2013-04-23 LAB — CBC WITH DIFFERENTIAL/PLATELET
HCT: 32.8 % — ABNORMAL LOW (ref 36.0–46.0)
Hemoglobin: 11 g/dL — ABNORMAL LOW (ref 12.0–15.0)
Lymphocytes Relative: 38 % (ref 12–46)
Lymphs Abs: 2.1 10*3/uL (ref 0.7–4.0)
MCHC: 33.5 g/dL (ref 30.0–36.0)
Monocytes Absolute: 0.3 10*3/uL (ref 0.1–1.0)
Monocytes Relative: 5 % (ref 3–12)
Neutro Abs: 3.1 10*3/uL (ref 1.7–7.7)
Neutrophils Relative %: 56 % (ref 43–77)
RBC: 3.72 MIL/uL — ABNORMAL LOW (ref 3.87–5.11)

## 2013-04-23 LAB — URINE MICROSCOPIC-ADD ON

## 2013-04-23 LAB — TYPE AND SCREEN: Antibody Screen: NEGATIVE

## 2013-04-23 LAB — LIPASE, BLOOD: Lipase: 33 U/L (ref 11–59)

## 2013-04-23 LAB — OCCULT BLOOD, POC DEVICE: Fecal Occult Bld: NEGATIVE

## 2013-04-23 MED ORDER — ONDANSETRON HCL 4 MG/2ML IJ SOLN
4.0000 mg | Freq: Once | INTRAMUSCULAR | Status: AC
  Administered 2013-04-23: 4 mg via INTRAVENOUS
  Filled 2013-04-23: qty 2

## 2013-04-23 MED ORDER — MORPHINE SULFATE 4 MG/ML IJ SOLN
4.0000 mg | Freq: Once | INTRAMUSCULAR | Status: AC
  Administered 2013-04-23: 4 mg via INTRAVENOUS
  Filled 2013-04-23: qty 1

## 2013-04-23 NOTE — ED Notes (Signed)
PT. REPORTS LARGE AMOUNT OF TARRY STOOLS TODAY WITH EPIGASTRIC PAIN , PT. STATED SYMPTOMS STARTED LAST Tuesday . DENIES SOB .

## 2013-04-23 NOTE — ED Provider Notes (Signed)
History    CSN: [id number] Arrival date & time 04/23/13  1914  First MD Initiated Contact with Patient 04/23/13 1959     Chief Complaint  Patient presents with  . Hematochezia   (Consider location/radiation/quality/duration/timing/severity/associated sxs/prior Treatment) The history is provided by the patient and medical records.   Patient presents to the ED complaining of a large amount of loose, tarry stools earlier today associated with epigastric pain and nausea but no vomiting. Patient states symptoms initially started last Tuesday but had subsided until earlier today.  Patient has a history of Crohn's disease, treated with a partial colectomy 30+ years ago-- the surgery was without complication. Patient states she frequently has intermittent episodes of dark, tarry stools.  Pt states she does have problems digesting dairy products and notes she did eat 3 bowls of strawberry ice cream 2 days ago.  No hx of GI bleed.  No prior blood transfusions. Denies any chest pain, shortness of breath, palpitations, dizziness, or weakness.  Patient currently takes daily aspirin, no other blood thinners.  No other abdominal surgeries.  Past Medical History  Diagnosis Date  . Hypertension   . Crohn disease   . Crohn's disease    Past Surgical History  Procedure Laterality Date  . Colectomy    . Bowel resection     No family history on file. History  Substance Use Topics  . Smoking status: Never Smoker   . Smokeless tobacco: Not on file  . Alcohol Use: No   OB History   Grav Para Term Preterm Abortions TAB SAB Ect Mult Living                 Review of Systems  Gastrointestinal: Positive for abdominal pain.       Tarry stools  All other systems reviewed and are negative.    Allergies  Sulfa antibiotics and Codeine  Home Medications   Current Outpatient Rx  Name  Route  Sig  Dispense  Refill  . amLODipine (NORVASC) 5 MG tablet   Oral   Take 5 mg by mouth daily.           Marland Kitchen aspirin 81 MG chewable tablet   Oral   Chew 81 mg by mouth daily.          BP 154/74  Pulse 73  Temp(Src) 98.3 F (36.8 C) (Oral)  Resp 14  SpO2 98%  Physical Exam  Nursing note and vitals reviewed. Constitutional: She is oriented to person, place, and time. She appears well-developed and well-nourished.  HENT:  Head: Normocephalic and atraumatic.  Mouth/Throat: Oropharynx is clear and moist.  Eyes: Conjunctivae and EOM are normal. Pupils are equal, round, and reactive to light.  Neck: Normal range of motion.  Cardiovascular: Normal rate, regular rhythm and normal heart sounds.   Pulmonary/Chest: Effort normal and breath sounds normal.  Abdominal: Soft. Bowel sounds are normal. There is tenderness in the epigastric area and left upper quadrant. There is no CVA tenderness, no tenderness at McBurney's point and negative Murphy's sign.    Mild TTP epigastric/LUQ region  Genitourinary: Rectal exam shows no external hemorrhoid, no fissure, no mass, no tenderness and anal tone normal.  Musculoskeletal: Normal range of motion.  Neurological: She is alert and oriented to person, place, and time. She has normal strength. No cranial nerve deficit or sensory deficit.  Skin: Skin is warm and dry.  Psychiatric: She has a normal mood and affect.    ED Course  Procedures (including  critical care time)   Date: 04/23/2013  Rate: 67  Rhythm: normal sinus rhythm  QRS Axis: normal  Intervals: normal  ST/T Wave abnormalities: normal  Conduction Disutrbances:none  Narrative Interpretation:   Old EKG Reviewed: unchanged   Labs Reviewed  CBC WITH DIFFERENTIAL - Abnormal; Notable for the following:    RBC 3.72 (*)    Hemoglobin 11.0 (*)    HCT 32.8 (*)    RDW 15.9 (*)    All other components within normal limits  COMPREHENSIVE METABOLIC PANEL - Abnormal; Notable for the following:    GFR calc non Af Amer 59 (*)    GFR calc Af Amer 69 (*)    All other components within normal  limits  URINALYSIS, ROUTINE W REFLEX MICROSCOPIC - Abnormal; Notable for the following:    APPearance CLOUDY (*)    Bilirubin Urine SMALL (*)    Protein, ur 30 (*)    Leukocytes, UA MODERATE (*)    All other components within normal limits  URINE MICROSCOPIC-ADD ON - Abnormal; Notable for the following:    Bacteria, UA FEW (*)    Casts HYALINE CASTS (*)    All other components within normal limits  URINE CULTURE  LIPASE, BLOOD  POCT I-STAT TROPONIN I  OCCULT BLOOD, POC DEVICE  TYPE AND SCREEN   No results found.  1. Dark stools   2. Nausea     MDM   EKG NSR, no acute ischemic changes.  Trop negative.  FOBT negative.  H/H stable at 11/32.8.  U/a showing some signs of infection, culture pending.  Pt states pain has improved with morphine but she is still feeling very nauseated.  No active vomiting.  Will give zofran and attempt PO challenge.    12:25 AM Pt tolerating PO fluids without difficulty.  Repeat abdominal exam without pain or TTP.  Doubt acute/surgical abdomen-- sx likely due to Crohn's and recent dairy intake.  Pt afebrile, non-toxic appearing, NAD, VS stable- ok for discharge.  Rx zofran and keflex for possible UTI.  FU with PCP.  Discussed plan with pt, she agreed.  Return precautions advised.  Discussed with Dr. Alvino Chapel who agrees with plan.            Larene Pickett, PA-C 04/24/13 [place], PA-C 04/24/13 5701

## 2013-04-24 LAB — URINE CULTURE

## 2013-04-24 MED ORDER — ONDANSETRON 4 MG PO TBDP
ORAL_TABLET | ORAL | Status: AC
  Filled 2013-04-24: qty 1

## 2013-04-24 MED ORDER — ONDANSETRON 4 MG PO TBDP: 4.0000 mg | ORAL_TABLET | Freq: Three times a day (TID) | ORAL | Status: DC | PRN

## 2013-04-24 MED ORDER — CEPHALEXIN 500 MG PO CAPS: 500.0000 mg | ORAL_CAPSULE | Freq: Three times a day (TID) | ORAL | Status: DC

## 2013-04-24 MED ORDER — ONDANSETRON 4 MG PO TBDP
4.0000 mg | ORAL_TABLET | Freq: Once | ORAL | Status: AC
  Administered 2013-04-24: 4 mg via ORAL

## 2013-04-25 NOTE — ED Provider Notes (Signed)
Medical screening examination/treatment/procedure(s) were performed by non-physician practitioner and as supervising physician I was immediately available for consultation/collaboration.  Jasper Riling. Alvino Chapel, MD 04/25/13 1359

## 2013-05-04 ENCOUNTER — Other Ambulatory Visit: Payer: Self-pay | Admitting: Gastroenterology

## 2013-05-04 DIAGNOSIS — R109 Unspecified abdominal pain: Secondary | ICD-10-CM

## 2013-05-07 ENCOUNTER — Ambulatory Visit
Admission: RE | Admit: 2013-05-07 | Discharge: 2013-05-07 | Disposition: A | Discharge status: Home / Self Care | Payer: Medicare Other | Source: Ambulatory Visit | Attending: Gastroenterology | Admitting: Gastroenterology

## 2013-05-07 DIAGNOSIS — R109 Unspecified abdominal pain: Secondary | ICD-10-CM

## 2013-05-07 MED ORDER — IOHEXOL 300 MG/ML  SOLN
100.0000 mL | Freq: Once | INTRAMUSCULAR | Status: AC | PRN
  Administered 2013-05-07: 100 mL via INTRAVENOUS

## 2013-05-11 ENCOUNTER — Other Ambulatory Visit: Payer: Self-pay | Admitting: Gastroenterology

## 2013-05-11 DIAGNOSIS — R109 Unspecified abdominal pain: Secondary | ICD-10-CM

## 2013-05-11 DIAGNOSIS — R634 Abnormal weight loss: Secondary | ICD-10-CM

## 2013-05-14 ENCOUNTER — Ambulatory Visit
Admission: RE | Admit: 2013-05-14 | Discharge: 2013-05-14 | Disposition: A | Discharge status: Home / Self Care | Payer: Medicare Other | Source: Ambulatory Visit | Attending: Gastroenterology | Admitting: Gastroenterology

## 2013-05-14 DIAGNOSIS — R634 Abnormal weight loss: Secondary | ICD-10-CM

## 2013-05-14 DIAGNOSIS — R109 Unspecified abdominal pain: Secondary | ICD-10-CM

## 2014-01-13 DIAGNOSIS — G459 Transient cerebral ischemic attack, unspecified: Secondary | ICD-10-CM

## 2014-01-13 HISTORY — DX: Transient cerebral ischemic attack, unspecified: G45.9

## 2014-02-02 ENCOUNTER — Observation Stay (HOSPITAL_COMMUNITY)
Admission: EM | Admit: 2014-02-02 | Discharge: 2014-02-04 | Disposition: A | Discharge status: Home / Self Care | Payer: Medicare Other | Attending: Cardiology | Admitting: Cardiology

## 2014-02-02 ENCOUNTER — Encounter (HOSPITAL_COMMUNITY): Payer: Self-pay | Admitting: Emergency Medicine

## 2014-02-02 ENCOUNTER — Emergency Department (HOSPITAL_COMMUNITY): Payer: Medicare Other

## 2014-02-02 DIAGNOSIS — Z9049 Acquired absence of other specified parts of digestive tract: Secondary | ICD-10-CM | POA: Insufficient documentation

## 2014-02-02 DIAGNOSIS — K219 Gastro-esophageal reflux disease without esophagitis: Secondary | ICD-10-CM | POA: Insufficient documentation

## 2014-02-02 DIAGNOSIS — I10 Essential (primary) hypertension: Secondary | ICD-10-CM | POA: Insufficient documentation

## 2014-02-02 DIAGNOSIS — E7800 Pure hypercholesterolemia, unspecified: Secondary | ICD-10-CM | POA: Insufficient documentation

## 2014-02-02 DIAGNOSIS — K5090 Crohn's disease, unspecified, without complications: Secondary | ICD-10-CM | POA: Insufficient documentation

## 2014-02-02 DIAGNOSIS — G459 Transient cerebral ischemic attack, unspecified: Principal | ICD-10-CM | POA: Insufficient documentation

## 2014-02-02 LAB — COMPREHENSIVE METABOLIC PANEL
ALBUMIN: 3.9 g/dL (ref 3.5–5.2)
ALK PHOS: 83 U/L (ref 39–117)
ALT: 11 U/L (ref 0–35)
AST: 25 U/L (ref 0–37)
BUN: 17 mg/dL (ref 6–23)
CALCIUM: 9.9 mg/dL (ref 8.4–10.5)
CO2: 24 mEq/L (ref 19–32)
Chloride: 104 mEq/L (ref 96–112)
Creatinine, Ser: 1 mg/dL (ref 0.50–1.10)
GFR calc Af Amer: 62 mL/min — ABNORMAL LOW (ref >90)
GFR calc non Af Amer: 53 mL/min — ABNORMAL LOW (ref >90)
Glucose, Bld: 83 mg/dL (ref 70–99)
POTASSIUM: 4.4 meq/L (ref 3.7–5.3)
SODIUM: 142 meq/L (ref 137–147)
TOTAL PROTEIN: 7.4 g/dL (ref 6.0–8.3)
Total Bilirubin: 1.3 mg/dL — ABNORMAL HIGH (ref 0.3–1.2)

## 2014-02-02 LAB — CBC WITH DIFFERENTIAL/PLATELET
BASOS ABS: 0 10*3/uL (ref 0.0–0.1)
BASOS PCT: 0 % (ref 0–1)
EOS ABS: 0 10*3/uL (ref 0.0–0.7)
EOS PCT: 1 % (ref 0–5)
HCT: 39 % (ref 36.0–46.0)
Hemoglobin: 12.7 g/dL (ref 12.0–15.0)
LYMPHS ABS: 1.8 10*3/uL (ref 0.7–4.0)
Lymphocytes Relative: 31 % (ref 12–46)
MCH: 30.1 pg (ref 26.0–34.0)
MCHC: 32.6 g/dL (ref 30.0–36.0)
MCV: 92.4 fL (ref 78.0–100.0)
Monocytes Absolute: 0.2 10*3/uL (ref 0.1–1.0)
Monocytes Relative: 4 % (ref 3–12)
NEUTROS PCT: 64 % (ref 43–77)
Neutro Abs: 3.7 10*3/uL (ref 1.7–7.7)
PLATELETS: 167 10*3/uL (ref 150–400)
RBC: 4.22 MIL/uL (ref 3.87–5.11)
RDW: 15.4 % (ref 11.5–15.5)
WBC: 5.8 10*3/uL (ref 4.0–10.5)

## 2014-02-02 LAB — I-STAT TROPONIN, ED: Troponin i, poc: 0 ng/mL (ref 0.00–0.08)

## 2014-02-02 MED ORDER — SODIUM CHLORIDE 0.9 % IV SOLN
INTRAVENOUS | Status: DC
  Administered 2014-02-02: 22:00:00 via INTRAVENOUS

## 2014-02-02 MED ORDER — GADOBENATE DIMEGLUMINE 529 MG/ML IV SOLN
10.0000 mL | Freq: Once | INTRAVENOUS | Status: AC
  Administered 2014-02-02: 9 mL via INTRAVENOUS

## 2014-02-02 MED ORDER — HYPROMELLOSE (GONIOSCOPIC) 2.5 % OP SOLN: 1.0000 [drp] | Freq: Four times a day (QID) | OPHTHALMIC | Status: DC | PRN

## 2014-02-02 MED ORDER — ASPIRIN 325 MG PO TABS
325.0000 mg | ORAL_TABLET | Freq: Every day | ORAL | Status: DC
  Administered 2014-02-03 – 2014-02-04 (×3): 325 mg via ORAL
  Filled 2014-02-02 (×4): qty 1

## 2014-02-02 MED ORDER — POLYVINYL ALCOHOL 1.4 % OP SOLN
1.0000 [drp] | OPHTHALMIC | Status: DC | PRN
  Filled 2014-02-02: qty 15

## 2014-02-02 MED ORDER — ATORVASTATIN CALCIUM 20 MG PO TABS
20.0000 mg | ORAL_TABLET | Freq: Every day | ORAL | Status: DC
  Administered 2014-02-03: 20 mg via ORAL
  Filled 2014-02-02 (×2): qty 1

## 2014-02-02 MED ORDER — GADOFOSVESET TRISODIUM 244 MG/ML IV SOLN
10.0000 mL | Freq: Once | INTRAVENOUS | Status: DC
Start: 2014-02-02 — End: 2014-02-04

## 2014-02-02 MED ORDER — AMLODIPINE BESYLATE 5 MG PO TABS
5.0000 mg | ORAL_TABLET | Freq: Every day | ORAL | Status: DC
  Administered 2014-02-03 – 2014-02-04 (×2): 5 mg via ORAL
  Filled 2014-02-02 (×2): qty 1

## 2014-02-02 MED ORDER — ASPIRIN 300 MG RE SUPP
300.0000 mg | Freq: Every day | RECTAL | Status: DC
  Filled 2014-02-02 (×3): qty 1

## 2014-02-02 MED ORDER — HEPARIN SODIUM (PORCINE) 5000 UNIT/ML IJ SOLN
5000.0000 [IU] | Freq: Three times a day (TID) | INTRAMUSCULAR | Status: DC
  Administered 2014-02-02 – 2014-02-04 (×5): 5000 [IU] via SUBCUTANEOUS
  Filled 2014-02-02 (×8): qty 1

## 2014-02-02 MED ORDER — LOSARTAN POTASSIUM 50 MG PO TABS
50.0000 mg | ORAL_TABLET | Freq: Every day | ORAL | Status: DC
  Administered 2014-02-02 – 2014-02-04 (×3): 50 mg via ORAL
  Filled 2014-02-02 (×3): qty 1

## 2014-02-02 MED ORDER — SENNOSIDES-DOCUSATE SODIUM 8.6-50 MG PO TABS: 1.0000 | ORAL_TABLET | Freq: Every evening | ORAL | Status: DC | PRN

## 2014-02-02 NOTE — ED Notes (Signed)
Pt from gas station via [facility].  Pt was doing her morning routine when around 830am she had sudden on set of generalized weakness and "film over my eyes" vision change.  Pt and husband was on their way here, stopping at a gasstation, when she felt too weak to continue, calling EMS.  Pt reports blurred vision and weakness both improving.  Pt in NAD, A&O.

## 2014-02-02 NOTE — ED Notes (Signed)
Patient transported to MRI 

## 2014-02-02 NOTE — ED Notes (Signed)
Attempted report 

## 2014-02-02 NOTE — ED Provider Notes (Signed)
4:11 PM handoff from [place] PA-C at shift change  Patient with history of hypertension, no stroke history, with generalized upper extremity weakness and blurry vision, 2 self-limiting episodes, prior to arrival. CT of head is negative. Patient is currently pending a MRI/MRA of her brain to rule out a stroke. Patient will followup with her PCP, who has already been contacted, if this test is negative.  Plan: Discharge if MRI negative, admit if positive.  6:41 PM MRI neg and patient informed. She is doing well except she is hungry and is c/o leg cramps. Dr. Terrence Dupont has seen patient and is going to admit her for TIA work-up.   Plan: Admit.   Carlisle Cater, PA-C 02/02/14 1842

## 2014-02-02 NOTE — ED Notes (Signed)
Pt returned from MRI °

## 2014-02-02 NOTE — ED Provider Notes (Signed)
CSN: [id number]     Arrival date & time 02/02/14  1002 History   First MD Initiated Contact with Patient 02/02/14 1043     Chief Complaint  Patient presents with  . Weakness  . Visual Field Change     (Consider location/radiation/quality/duration/timing/severity/associated sxs/prior Treatment) HPI  [name] Is an extremely pleasant 79 year old female who presents to the emergency department with chief complaint of weakness and vision change.  Patient states that she was standing in her kitchen this morning when she had sudden onset bilateral upper extremity limb weakness.  She walked to her living room to take her blood pressure were she noticed her systolic pressure to be in the 160s.  She called out to her husband.  She noticed at that time that she began to feel nauseated and noticed bilateral blurry vision as if a "film were over my eyes." She states that this lasted several minutes and then resolved.  She had some continued weakness and nausea.  The patient left with her husband to come to the emergency department.  She began to feel sick again and weak while she was on [employer].  EMS was called and the patient was transported boarded to the emergency department.  She has no significant past medical history except for Crohn's disease and hypertension.  She is otherwise very healthy.  She denies any unilateral weakness, headache, difficulty with speech or swallowing.  She denies any vertiginous symptoms.  No recent upper respiratory infection symptoms, abdominal pain, constipation or diarrhea.  Patient denies any urinary symptoms.  Her primary care is Dr.Harwani.  Past Medical History  Diagnosis Date  . Hypertension   . Crohn disease   . Crohn's disease    Past Surgical History  Procedure Laterality Date  . Colectomy    . Bowel resection     History reviewed. No pertinent family history. History  Substance Use Topics  . Smoking status: Never Smoker   . Smokeless tobacco: Not  on file  . Alcohol Use: No   OB History   Grav Para Term Preterm Abortions TAB SAB Ect Mult Living                 Review of Systems  Ten systems reviewed and are negative for acute change, except as noted in the HPI.     Allergies  Sulfa antibiotics and Codeine  Home Medications   Prior to Admission medications   Medication Sig Start Date End Date Taking? Authorizing Provider  amLODipine (NORVASC) 5 MG tablet Take 5 mg by mouth daily.   Yes Historical Provider, MD  aspirin 81 MG chewable tablet Chew 81 mg by mouth daily.   Yes Historical Provider, MD  hydroxypropyl methylcellulose (ISOPTO TEARS) 2.5 % ophthalmic solution Place 1 drop into both eyes 4 (four) times daily as needed for dry eyes.   Yes Historical Provider, MD   BP 161/79  Pulse 65  Temp(Src) 98.7 F (37.1 C) (Oral)  Resp 16  Ht 5' 3"  (1.6 m)  Wt 100 lb (45.36 kg)  BMI 17.72 kg/m2  SpO2 100% Physical Exam Physical Exam  Nursing note and vitals reviewed. Constitutional: She is oriented to person, place, and time. She appears well-developed and well-nourished. No distress.  HENT:  Head: Normocephalic and atraumatic.  Eyes: Conjunctivae normal and EOM are normal. Pupils are equal, round, and reactive to light. No scleral icterus.  Neck: Normal range of motion.  Cardiovascular: Normal rate, regular rhythm and normal heart sounds.  Exam  reveals no gallop and no friction rub.   No murmur heard. Pulmonary/Chest: Effort normal and breath sounds normal. No respiratory distress.  Abdominal: Soft. Bowel sounds are normal. She exhibits no distension and no mass. There is no tenderness. There is no guarding.  Neurological: She is alert and oriented to person, place, and time.  Speech is clear and goal oriented, follows commands Major Cranial nerves without deficit, no facial droop Normal strength in upper and lower extremities bilaterally including dorsiflexion and plantar flexion, strong and equal grip  strength Sensation normal to light and sharp touch Moves extremities without ataxia, coordination intact Normal finger to nose and rapid alternating movements Skin: Skin is warm and dry. She is not diaphoretic.    ED Course  Procedures (including critical care time) Labs Review Labs Reviewed  CBC WITH DIFFERENTIAL  COMPREHENSIVE METABOLIC PANEL  I-STAT [place], ED    Imaging Review No results found.   EKG Interpretation None       Date: 02/02/2014  Rate: 62  Rhythm: normal sinus rhythm  QRS Axis: left  Intervals: normal  ST/T Wave abnormalities: normal  Conduction Disutrbances:none  Narrative Interpretation:   Old EKG Reviewed: unchanged   MDM   Final diagnoses:  TIA (transient ischemic attack)    Patient with vague neurologic sxs. I do not feel that this was a presyncopal episode.  ABCD2 score of 3  Labs and CT pending   Patient labs ekg and imaging negative. i have spoken to Dr. Terrence Dupont who asks that the patient get MRI/MRA brain. I have given report to PA [facility] who will    Margarita Mail, PA-C 02/02/14 2009

## 2014-02-02 NOTE — Progress Notes (Signed)
Pt arrived the floor at about 2020,denies any discomfort at that moment except occasional leg cramps,pt settled in bed,Dr [facility] paged at 2117 and notified of pt's CXR reorder which was already done in the ED,ordered to cancel the second order,same done,pt reassured, will however continue to monitor. Danna Sewell Efe [place]

## 2014-02-02 NOTE — H&P (Signed)
[name] is an 79 y.o. female.   Chief Complaint: Generalized weakness blurring of vision dizziness/unsteady gait HPI: Patient is 79 year old female with past medical history significant for hypertension hypercholesteremia stay of syncope in the past, GERD, history of Crohn's disease, came to the ER by EMS complaining of generalized weakness associated with blurring of vision and dizziness and unsteady gait associated with nausea and was noted to have elevated blood pressure early this morning at home, while coming to the ER felt dizzy and weak pulled  over the car called  EMS and came to the ER. Patient denies any weakness in the left or right side of the arm or legs. Denies any slurred speech. Denies any headache or seizure activity. Denies any palpitation or syncopal episode. Patient states she had similar presentation a few years ago while in [place] had extensive workup which was negative. Patient presently just complains of cramps in the legs.  Past Medical History  Diagnosis Date  . Hypertension   . Crohn disease   . Crohn's disease     Past Surgical History  Procedure Laterality Date  . Colectomy    . Bowel resection      History reviewed. No pertinent family history. Social History:  reports that she has never smoked. She does not have any smokeless tobacco history on file. She reports that she does not drink alcohol or use illicit drugs.  Allergies:  Allergies  Allergen Reactions  . Sulfa Antibiotics Anaphylaxis and Rash  . Codeine Nausea And Vomiting     (Not in a hospital admission)  Results for orders placed during the hospital encounter of 02/02/14 (from the past 48 hour(s))  CBC WITH DIFFERENTIAL     Status: None   Collection Time    02/02/14 11:48 AM      Result Value Ref Range   WBC 5.8  4.0 - 10.5 K/uL   RBC 4.22  3.87 - 5.11 MIL/uL   Hemoglobin 12.7  12.0 - 15.0 g/dL   HCT 39.0  36.0 - 46.0 %   MCV 92.4  78.0 - 100.0 fL   MCH 30.1  26.0 - 34.0 pg   MCHC 32.6  30.0 - 36.0 g/dL   RDW 15.4  11.5 - 15.5 %   Platelets 167  150 - 400 K/uL   Neutrophils Relative % 64  43 - 77 %   Neutro Abs 3.7  1.7 - 7.7 K/uL   Lymphocytes Relative 31  12 - 46 %   Lymphs Abs 1.8  0.7 - 4.0 K/uL   Monocytes Relative 4  3 - 12 %   Monocytes Absolute 0.2  0.1 - 1.0 K/uL   Eosinophils Relative 1  0 - 5 %   Eosinophils Absolute 0.0  0.0 - 0.7 K/uL   Basophils Relative 0  0 - 1 %   Basophils Absolute 0.0  0.0 - 0.1 K/uL  COMPREHENSIVE METABOLIC PANEL     Status: Abnormal   Collection Time    02/02/14 11:48 AM      Result Value Ref Range   Sodium 142  137 - 147 mEq/L   Potassium 4.4  3.7 - 5.3 mEq/L   Chloride 104  96 - 112 mEq/L   CO2 24  19 - 32 mEq/L   Glucose, Bld 83  70 - 99 mg/dL   BUN 17  6 - 23 mg/dL   Creatinine, Ser 1.00  0.50 - 1.10 mg/dL   Calcium 9.9  8.4 - 10.5  mg/dL   Total Protein 7.4  6.0 - 8.3 g/dL   Albumin 3.9  3.5 - 5.2 g/dL   AST 25  0 - 37 U/L   ALT 11  0 - 35 U/L   Alkaline Phosphatase 83  39 - 117 U/L   Total Bilirubin 1.3 (*) 0.3 - 1.2 mg/dL   GFR calc non Af Amer 53 (*) >90 mL/min   GFR calc Af Amer 62 (*) >90 mL/min   Comment: (NOTE)     The eGFR has been calculated using the CKD EPI equation.     This calculation has not been validated in all clinical situations.     eGFR's persistently <90 mL/min signify possible Chronic Kidney     Disease.  Randolm Idol, ED     Status: None   Collection Time    02/02/14 12:08 PM      Result Value Ref Range   Troponin i, poc 0.00  0.00 - 0.08 ng/mL   Comment 3            Comment: Due to the release kinetics of cTnI,     a negative result within the first hours     of the onset of symptoms does not rule out     myocardial infarction with certainty.     If myocardial infarction is still suspected,     repeat the test at appropriate intervals.   Dg Chest 2 View  02/02/2014   CLINICAL DATA:  Weakness and short of breath  EXAM: CHEST  2 VIEW  COMPARISON:  03/31/2009  FINDINGS:  Heart size is mildly enlarged. Vascularity is normal. Lungs are clear without infiltrate effusion or mass.  IMPRESSION: No active cardiopulmonary disease.   Electronically Signed   By: Franchot Gallo M.D.   On: 02/02/2014 12:48   Ct Head Wo Contrast  02/02/2014   CLINICAL DATA:  Sudden onset of generalized weakness. Blurred vision.  EXAM: CT HEAD WITHOUT CONTRAST  TECHNIQUE: Contiguous axial images were obtained from the base of the skull through the vertex without contrast.  COMPARISON:  02/19/2012.  FINDINGS: No evidence for acute infarction, hemorrhage, mass lesion, hydrocephalus, or extra-axial fluid. Mild atrophy with chronic microvascular ischemic change. Asymmetric prominence of the left occipital sulci, possibly mild remote left PCA infarct, similar to priors. No CT signs of proximal vascular thrombosis. Mild vascular calcification. Calvarium intact. Clear orbits, sinuses and mastoids.  IMPRESSION: No acute intracranial abnormality. Stable appearance from 2013. Mild age-related changes as described.   Electronically Signed   By: Rolla Flatten M.D.   On: 02/02/2014 13:37    Review of Systems  Constitutional: Negative for fever, chills and weight loss.  HENT: Negative for hearing loss and tinnitus.   Eyes: Positive for blurred vision. Negative for double vision and photophobia.  Cardiovascular: Negative for chest pain, palpitations, orthopnea and leg swelling.  Gastrointestinal: Positive for nausea. Negative for vomiting, abdominal pain, diarrhea and constipation.  Genitourinary: Negative for dysuria and urgency.  Musculoskeletal: Negative for myalgias and neck pain.  Neurological: Positive for dizziness. Negative for tingling, speech change, focal weakness, seizures and headaches.    Blood pressure 169/90, pulse 68, temperature 98.7 F (37.1 C), temperature source Oral, resp. rate 13, height 5' 3"  (1.6 m), weight 45.36 kg (100 lb), SpO2 100.00%. Physical Exam  Constitutional: She is  oriented to person, place, and time.  HENT:  Head: Normocephalic and atraumatic.  Eyes: Conjunctivae are normal. Pupils are equal, round, and reactive to light. Left  eye exhibits no discharge. No scleral icterus.  Neck: Normal range of motion. Neck supple. No JVD present. No tracheal deviation present. No thyromegaly present.  Cardiovascular: Normal rate and regular rhythm.  Exam reveals gallop (S4 gallop noted).   Murmur (Soft systolic murmur noted) heard. Respiratory: Effort normal and breath sounds normal. No respiratory distress. She has no wheezes. She has no rales.  GI: Soft. Bowel sounds are normal. She exhibits no distension. There is no tenderness. There is no rebound and no guarding.  Musculoskeletal: She exhibits no edema and no tenderness.  Neurological: She is alert and oriented to person, place, and time. No cranial nerve deficit.  Motor strength 5 over 5 bilaterally. Sensory intact plantars downgoing     Assessment/Plan Status post TIA rule out CVA Uncontrolled hypertension Hypercholesteremia History of syncope GERD History of Crohn's disease Plan As per orders  Clent Demark 02/02/2014, 5:30 PM

## 2014-02-02 NOTE — ED Notes (Signed)
Patient transported to CT 

## 2014-02-03 LAB — LIPID PANEL
CHOLESTEROL: 217 mg/dL — AB (ref 0–200)
HDL: 111 mg/dL (ref >39)
LDL Cholesterol: 88 mg/dL (ref 0–99)
TRIGLYCERIDES: 88 mg/dL (ref <150)
Total CHOL/HDL Ratio: 2 RATIO
VLDL: 18 mg/dL (ref 0–40)

## 2014-02-03 LAB — HEMOGLOBIN A1C
Hgb A1c MFr Bld: 5.6 % (ref <5.7)
Mean Plasma Glucose: 114 mg/dL (ref <117)

## 2014-02-03 LAB — GLUCOSE, CAPILLARY
Glucose-Capillary: 54 mg/dL — ABNORMAL LOW (ref 70–99)
Glucose-Capillary: 70 mg/dL (ref 70–99)

## 2014-02-03 NOTE — Progress Notes (Signed)
*  PRELIMINARY RESULTS* Vascular Ultrasound Carotid Duplex (Doppler) has been completed.  Preliminary findings: Bilateral:  1-39% ICA stenosis.  Vertebral artery flow is antegrade.      Landry Mellow, RDMS, RVT  02/03/2014, 11:21 AM

## 2014-02-03 NOTE — Evaluation (Signed)
Occupational Therapy Evaluation Patient Details Name: [name] MRN: [id number] DOB: 04-Nov-1934 Today's Date: 02/03/2014    History of Present Illness 79 y.o. female admitted with dizziness, blurry vision, unsteady gait and generalized weakness. Pt worked up for TIA/CVA.  MRI negaive for acute infarct.     Clinical Impression   Patient evaluated by Occupational Therapy with no further acute OT needs identified. All education has been completed and the patient has no further questions. Pt appears to be back to baseline with no apparent deficits.  See below for any follow-up Occupational Therapy or equipment needs. OT is signing off. Thank you for this referral.     Follow Up Recommendations  No OT follow up    Equipment Recommendations  None recommended by OT    Recommendations for Other Services       Precautions / Restrictions Precautions Precautions: None      Mobility Bed Mobility                  Transfers Overall transfer level: Independent Equipment used: None                  Balance Overall balance assessment: Independent                               Standardized Balance Assessment Standardized Balance Assessment : Berg Balance Test;Dynamic Gait Index Berg Balance Test Sit to Stand: Able to stand without using hands and stabilize independently Standing Unsupported: Able to stand safely 2 minutes Sitting with Back Unsupported but Feet Supported on Floor or Stool: Able to sit safely and securely 2 minutes Stand to Sit: Sits safely with minimal use of hands Transfers: Able to transfer safely, minor use of hands Standing Unsupported with Eyes Closed: Able to stand 10 seconds safely Standing Ubsupported with Feet Together: Able to place feet together independently and stand 1 minute safely From Standing, Reach Forward with Outstretched Arm: Can reach confidently >25 cm (10") From Standing Position, Pick up Object from Floor: Able  to pick up shoe safely and easily From Standing Position, Turn to Look Behind Over each Shoulder: Looks behind from both sides and weight shifts well Turn 360 Degrees: Able to turn 360 degrees safely in 4 seconds or less Standing Unsupported, Alternately Place Feet on Step/Stool: Able to stand independently and safely and complete 8 steps in 20 seconds Standing Unsupported, One Foot in Front: Able to place foot tandem independently and hold 30 seconds Standing on One Leg: Able to lift leg independently and hold > 10 seconds Total Score: 56        ADL Overall ADL's : Independent                                       General ADL Comments: Pt able to perform set up for BADLs independently.  Pt. able to simulate showering independently with no LOB.  Able to retrieve items from floor     Vision Eye Alignment: Within Functional Limits Alignment/Gaze Preference: Within Defined Limits Ocular Range of Motion: Within Functional Limits Tracking/Visual Pursuits: Able to track stimulus in all quads without difficulty             Perception     Praxis Praxis Praxis tested?: Within functional limits    Pertinent Vitals/Pain Pt denies pain  Hand Dominance Right   Extremity/Trunk Assessment Upper Extremity Assessment Upper Extremity Assessment: Overall WFL for tasks assessed   Lower Extremity Assessment Lower Extremity Assessment: Overall WFL for tasks assessed   Cervical / Trunk Assessment Cervical / Trunk Assessment: Normal   Communication Communication Communication: No difficulties   Cognition Arousal/Alertness: Awake/alert Behavior During Therapy: WFL for tasks assessed/performed Overall Cognitive Status: Within Functional Limits for tasks assessed                     General Comments       Exercises       Shoulder Instructions      Home Living Family/patient expects to be discharged to:: Private residence Living Arrangements:  Spouse/significant other Available Help at Discharge: Family;Available PRN/intermittently Type of Home: House Home Access: Stairs to enter Entrance Stairs-Number of Steps: 3   Home Layout: One level     Bathroom Shower/Tub: Tub/shower unit;Walk-in shower Shower/tub characteristics: Scientist, water quality: None          Prior Functioning/Environment Level of Independence: Independent        Comments: Pt drives and independent with community activities    OT Diagnosis:     OT Problem List:     OT Treatment/Interventions:      OT Goals(Current goals can be found in the care plan section)    OT Frequency:     Barriers to D/C:            Co-evaluation              End of Session Nurse Communication: Mobility status (RN instructed therapist to turn off chair alarm)  Activity Tolerance: Patient tolerated treatment well Patient left: in chair;with call bell/phone within reach   Time: 0630-1601 OT Time Calculation (min): 19 min Charges:  OT General Charges $OT Visit: 1 Procedure OT Evaluation $Initial OT Evaluation Tier I: 1 Procedure OT Treatments $Self Care/Home Management : 8-22 mins G-Codes:    Bradyn Vassey M Gracieann Stannard 02-28-2014, 1:40 PM

## 2014-02-03 NOTE — Progress Notes (Signed)
Physical Therapy Discharge Patient Details Name: [name] MRN: [id number] DOB: 03-29-1935 Today's Date: 02/03/2014 Time:  -     Patient discharged from PT services secondary to per OT, pt is screened out for PT due to no further needs.  Will sign off for PT at this time..  GP    02/03/2014  Donnella Sham, PT [phone]  (pager) Tessie Fass Callen Zuba 02/03/2014, 2:50 PM

## 2014-02-03 NOTE — Progress Notes (Signed)
OT Cancellation Note  Patient Details Name: [name] MRN: [id number] DOB: 01/01/1935   Cancelled Treatment:    Reason Eval/Treat Not Completed: Patient at procedure or test/ unavailable - will reattempt  [place], OTR/L [phone] 02/03/2014, 10:53 AM

## 2014-02-03 NOTE — Evaluation (Signed)
Speech Language Pathology Evaluation Patient Details Name: [name] MRN: [id number] DOB: Jan 11, 1935 Today's Date: 02/03/2014 Time: 1000-1013 SLP Time Calculation (min): 13 min  Problem List:  Patient Active Problem List   Diagnosis Date Noted  . TIA (transient ischemic attack) 02/02/2014   Past Medical History:  Past Medical History  Diagnosis Date  . Hypertension   . Crohn disease   . Crohn's disease    Past Surgical History:  Past Surgical History  Procedure Laterality Date  . Colectomy    . Bowel resection     HPI:  Patient is 79 year old female with past medical history significant for hypertension hypercholesteremia stay of syncope in the past, GERD, history of Crohn's disease, came to the ER by EMS complaining of generalized weakness associated with blurring of vision and dizziness and unsteady gait associated with nausea and was noted to have elevated blood pressure early this morning at home, while coming to the ER felt dizzy and weak pulled  over the car called  EMS and came to the ER. Patient denies any weakness in the left or right side of the arm or legs. Denies any slurred speech. Denies any headache or seizure activity. Denies any palpitation or syncopal episode. Patient states she had similar presentation a few years ago while in [place] had extensive workup which was negative   Assessment / Plan / Recommendation Clinical Impression  Pt demosntrates cognitive linguistic  function WNL. Pt is independent. No SLP f/u needed, will sign off    SLP Assessment  Patient does not need any further Speech Lanaguage Pathology Services    Follow Up Recommendations       Frequency and Duration        Pertinent Vitals/Pain NA   SLP Goals     SLP Evaluation Prior Functioning  Cognitive/Linguistic Baseline: Within functional limits   Cognition  Overall Cognitive Status: Within Functional Limits for tasks assessed Arousal/Alertness: Awake/alert Orientation  Level: Oriented X4 Attention: Alternating Alternating Attention: Appears intact Memory: Appears intact Awareness: Appears intact Problem Solving: Appears intact Executive Function: Reasoning;Decision Making;Initiating;Self Monitoring;Self Correcting Reasoning: Appears intact Decision Making: Appears intact Initiating: Appears intact Self Monitoring: Appears intact Self Correcting: Appears intact Safety/Judgment: Appears intact    Comprehension  Auditory Comprehension Overall Auditory Comprehension: Appears within functional limits for tasks assessed    Expression Verbal Expression Overall Verbal Expression: Appears within functional limits for tasks assessed   Oral / Motor Oral Motor/Sensory Function Overall Oral Motor/Sensory Function: Appears within functional limits for tasks assessed Motor Speech Overall Motor Speech: Appears within functional limits for tasks assessed   GO    Herbie [place], MA CCC-SLP [phone]  Katherene Ponto Taylar Hartsough 02/03/2014, 10:17 AM

## 2014-02-03 NOTE — Progress Notes (Signed)
UR completed 

## 2014-02-03 NOTE — Progress Notes (Signed)
Subjective:  Complains of generalized weakness. No further episodes of blurring of vision. No slurred speech or seizure activity.  Objective:  Vital Signs in the last 24 hours: Temp:  [97.5 F (36.4 C)-98.3 F (36.8 C)] 98 F (36.7 C) (04/22 1030) Pulse Rate:  [56-124] 78 (04/22 1030) Resp:  [16-20] 20 (04/22 1030) BP: (120-180)/(57-90) 167/80 mmHg (04/22 1030) SpO2:  [93 %-100 %] 100 % (04/22 1030) Weight:  [40.869 kg (90 lb 1.6 oz)] 40.869 kg (90 lb 1.6 oz) (04/21 2021)  Intake/Output from previous day: 04/21 0701 - 04/22 0700 In: -  Out: 400 [Urine:400] Intake/Output from this shift: Total I/O In: 120 [P.O.:120] Out: -   Physical Exam: Neck: no adenopathy, no carotid bruit, no JVD and supple, symmetrical, trachea midline Lungs: clear to auscultation bilaterally Heart: regular rate and rhythm, S1, S2 normal, no murmur, click, rub or gallop Abdomen: soft, non-tender; bowel sounds normal; no masses,  no organomegaly Extremities: extremities normal, atraumatic, no cyanosis or edema  Lab Results:  Recent Labs  02/02/14 1148  WBC 5.8  HGB 12.7  PLT 167    Recent Labs  02/02/14 1148  NA 142  K 4.4  CL 104  CO2 24  GLUCOSE 83  BUN 17  CREATININE 1.00   No results found for this basename: TROPONINI, CK, MB,  in the last 72 hours Hepatic Function Panel  Recent Labs  02/02/14 1148  PROT 7.4  ALBUMIN 3.9  AST 25  ALT 11  ALKPHOS 83  BILITOT 1.3*    Recent Labs  02/03/14 0500  CHOL 217*   No results found for this basename: PROTIME,  in the last 72 hours  Imaging: Imaging results have been reviewed and Dg Chest 2 View  02/02/2014   CLINICAL DATA:  Weakness and short of breath  EXAM: CHEST  2 VIEW  COMPARISON:  03/31/2009  FINDINGS: Heart size is mildly enlarged. Vascularity is normal. Lungs are clear without infiltrate effusion or mass.  IMPRESSION: No active cardiopulmonary disease.   Electronically Signed   By: Franchot Gallo M.D.   On: 02/02/2014  12:48   Ct Head Wo Contrast  02/02/2014   CLINICAL DATA:  Sudden onset of generalized weakness. Blurred vision.  EXAM: CT HEAD WITHOUT CONTRAST  TECHNIQUE: Contiguous axial images were obtained from the base of the skull through the vertex without contrast.  COMPARISON:  02/19/2012.  FINDINGS: No evidence for acute infarction, hemorrhage, mass lesion, hydrocephalus, or extra-axial fluid. Mild atrophy with chronic microvascular ischemic change. Asymmetric prominence of the left occipital sulci, possibly mild remote left PCA infarct, similar to priors. No CT signs of proximal vascular thrombosis. Mild vascular calcification. Calvarium intact. Clear orbits, sinuses and mastoids.  IMPRESSION: No acute intracranial abnormality. Stable appearance from 2013. Mild age-related changes as described.   Electronically Signed   By: Rolla Flatten M.D.   On: 02/02/2014 13:37   Mr Virgel Paling Wo Contrast  02/02/2014   CLINICAL DATA:  79 year old female with weakness blurred vision dizziness and unsteady gait. Visual field change. Initial encounter.  EXAM: MRI HEAD WITHOUT AND WITH CONTRAST  MRA HEAD WITHOUT CONTRAST  TECHNIQUE: Multiplanar, multiecho pulse sequences of the brain and surrounding structures were obtained without and with intravenous contrast. Angiographic images of the head were obtained using MRA technique without contrast.  CONTRAST:  71m MULTIHANCE GADOBENATE DIMEGLUMINE 529 MG/ML IV SOLN  COMPARISON:  Head CTs without contrast 1326 hr the same day and earlier.  FINDINGS: MRI HEAD FINDINGS  Cerebral  volume is within normal limits for age. No restricted diffusion to suggest acute infarction. No midline shift, mass effect, evidence of mass lesion, ventriculomegaly, extra-axial collection or acute intracranial hemorrhage. Cervicomedullary junction and pituitary are within normal limits. Negative visualized cervical spine. Major intracranial vascular flow voids are preserved.  Patchy moderate for age nonspecific  cerebral white matter T2 and FLAIR hyperintensity. No cortical encephalomalacia identified. Mild to moderate nonspecific T2 heterogeneity in the deep gray matter nuclei and pons. Cerebellum within normal limits. Visible internal auditory structures appear normal. There are occasional chronic micro hemorrhages in the brain at (left parietal lobe series 9, image 16). No abnormal enhancement identified.  Mastoids are clear. Negative paranasal sinuses. Visualized orbit soft tissues are within normal limits. Normal bone marrow signal. Visualized scalp soft tissues are within normal limits.  MRA HEAD FINDINGS  Antegrade flow in the posterior circulation with codominant distal vertebral arteries. Normal PICA origins. Normal vertebrobasilar junction. No basilar stenosis. Normal SCA and PCA origins. Posterior communicating arteries are diminutive or absent. Bilateral PCA branches are within normal limits.  Antegrade flow in both ICA siphons. No ICA stenosis. Ophthalmic artery origin regions are normal. There is a small infundibulum of the left ICA terminus suspected (series 3, image 81). Patent carotid termini. Normal MCA and ACA origins.  Azygos type ACA anatomy proximally. Anterior communicating artery within normal limits. Visualized bilateral MCA branches are within normal limits.  IMPRESSION: 1. No acute intracranial abnormality. Moderate for age nonspecific signal changes in the brain. 2.  Negative intracranial MRA.   Electronically Signed   By: Lars Pinks M.D.   On: 02/02/2014 18:03   Mr Jeri Cos VO Contrast  02/02/2014   CLINICAL DATA:  79 year old female with weakness blurred vision dizziness and unsteady gait. Visual field change. Initial encounter.  EXAM: MRI HEAD WITHOUT AND WITH CONTRAST  MRA HEAD WITHOUT CONTRAST  TECHNIQUE: Multiplanar, multiecho pulse sequences of the brain and surrounding structures were obtained without and with intravenous contrast. Angiographic images of the head were obtained using MRA  technique without contrast.  CONTRAST:  69m MULTIHANCE GADOBENATE DIMEGLUMINE 529 MG/ML IV SOLN  COMPARISON:  Head CTs without contrast 1326 hr the same day and earlier.  FINDINGS: MRI HEAD FINDINGS  Cerebral volume is within normal limits for age. No restricted diffusion to suggest acute infarction. No midline shift, mass effect, evidence of mass lesion, ventriculomegaly, extra-axial collection or acute intracranial hemorrhage. Cervicomedullary junction and pituitary are within normal limits. Negative visualized cervical spine. Major intracranial vascular flow voids are preserved.  Patchy moderate for age nonspecific cerebral white matter T2 and FLAIR hyperintensity. No cortical encephalomalacia identified. Mild to moderate nonspecific T2 heterogeneity in the deep gray matter nuclei and pons. Cerebellum within normal limits. Visible internal auditory structures appear normal. There are occasional chronic micro hemorrhages in the brain at (left parietal lobe series 9, image 16). No abnormal enhancement identified.  Mastoids are clear. Negative paranasal sinuses. Visualized orbit soft tissues are within normal limits. Normal bone marrow signal. Visualized scalp soft tissues are within normal limits.  MRA HEAD FINDINGS  Antegrade flow in the posterior circulation with codominant distal vertebral arteries. Normal PICA origins. Normal vertebrobasilar junction. No basilar stenosis. Normal SCA and PCA origins. Posterior communicating arteries are diminutive or absent. Bilateral PCA branches are within normal limits.  Antegrade flow in both ICA siphons. No ICA stenosis. Ophthalmic artery origin regions are normal. There is a small infundibulum of the left ICA terminus suspected (series 3, image 81). Patent carotid  termini. Normal MCA and ACA origins.  Azygos type ACA anatomy proximally. Anterior communicating artery within normal limits. Visualized bilateral MCA branches are within normal limits.  IMPRESSION: 1. No acute  intracranial abnormality. Moderate for age nonspecific signal changes in the brain. 2.  Negative intracranial MRA.   Electronically Signed   By: Lars Pinks M.D.   On: 02/02/2014 18:03    Cardiac Studies:  Assessment/Plan:  Status post TIA rule out CVA workup in progress Uncontrolled hypertension  Hypercholesteremia  History of syncope  GERD  History of Crohn's disease Plan Increase ambulation Possible discharge tomorrow if stable  LOS: 1 day    Clent Demark 02/03/2014, 12:00 PM

## 2014-02-03 NOTE — Progress Notes (Signed)
Hypoglycemic Event  CBG: 54  Treatment: 15 GM carbohydrate snack  Symptoms: Sweaty, Shaky and Hungry  Follow-up CBG: Time 0614 CBG Result:70  Possible Reasons for Event: Inadequate meal intake  Comments/MD notified:Dr Harwani    [name]  Remember to initiate Hypoglycemia Order Set & complete

## 2014-02-03 NOTE — Progress Notes (Signed)
*  PRELIMINARY RESULTS* Echocardiogram 2D Echocardiogram has been performed.  [name] 02/03/2014, 11:16 AM

## 2014-02-04 MED ORDER — ATORVASTATIN CALCIUM 20 MG PO TABS: 20.0000 mg | ORAL_TABLET | Freq: Every day | ORAL | Status: DC

## 2014-02-04 MED ORDER — LOSARTAN POTASSIUM 50 MG PO TABS: 50.0000 mg | ORAL_TABLET | Freq: Every day | ORAL | Status: DC

## 2014-02-04 NOTE — Discharge Instructions (Signed)
Amaurosis Fugax Amaurosis fugax is a condition in which a person loses sight in one eye. The loss of vision in the affected eye may be total or partial. It usually lasts just a few seconds or minutes. Then, it returns to normal. Occasionally, it may last for several hours. This is caused by interruption of blood flow to the artery that supplies blood to the retina (lining at the back of the eye, contains nerves needed for sight). The temporary loss of blood flow causes symptoms similar to a stroke. The family of symptoms that happen from a loss of blood flow is called a Transient Ischemic Attack (TIA, mini-stroke). In the case of amaurosis fugax, the eye is the organ that is involved. SYMPTOMS   Painless, sudden loss of vision in one eye.  Visual loss is often from the top down, appearing like a curtain being pulled down over the field of vision.  Rapid return of vision. Vision generally comes back in a few minutes to several hours. CAUSES  TIAs and amaurosis fugax are caused by a loss of blood flow. This can be due to a buildup of cholesterol and fats (plaque) in the arteries or the heart. If some of that plaque comes off the artery and gets into the bloodstream, it can flow to the artery that supplies blood to the retina, blocking the flow of blood to the retina. When that happens, vision is lost for as long as the blood flow is interrupted. Factors that make it more likely you will have amaurosis fugax at some point include:  Smoking.  Poorly controlled diabetes.  High blood pressure.  High cholesterol levels. Medical conditions that may increase the risk of an attack of amaurosis fugax include:  Heart disease.  Diseases of the heart valves.  Certain diseases of the blood (sickle cell anemia, leukemia).  Blood clotting (coagulation) disorders.  Artery inflammation (temporal arteritis, giant cell arteritis). Since amaurosis fugax is an "incomplete stroke," in some people it can be a  sign of an increased risk for an actual stroke. A stroke can result in permanent vision loss or loss of other body functions. As a result, caring for yourself after amaurosis fugax means taking many of the same steps you should take to prevent a stroke. HOME CARE INSTRUCTIONS   Only take over-the-counter or prescription medicines for pain, discomfort, or fever as directed by your caregiver.  Take any medicines that are prescribed for control of your blood pressure and cholesterol levels.  Keep diabetes under control as well as possible.  Stop smoking.  Follow diet instructions, if your caregiver has given them to you.  Try to get at least 30 minutes of moderate physical activity every day. If you have not been active, talk to your caregiver about how to get started. SEEK IMMEDIATE MEDICAL CARE IF:   You lose vision in one or both eyes again, even if only for a short period of time.  You lose vision in one eye and it does not recover within a very brief time (less than 5-10 minutes). The sooner you see an eye specialist (ophthalmologist), the better the chance of regaining some vision, in the case of a central retinal artery occlusion (CRAO, blockage of central retinal artery). However, most cases of CRAO result in some degree of permanent visual loss, even with aggressive treatment.  You have symptoms of a stroke:  Weakness in one side of your body.  Difficulty speaking or thinking clearly.  Lack of coordination. Document  Released: 07/10/2008 Document Revised: 12/24/2011 Document Reviewed: 07/10/2008 [facility] Patient Information 2014 [place], [place]. Transient Ischemic Attack A transient ischemic attack (TIA) is a "warning stroke" that causes stroke-like symptoms. Unlike a stroke, a TIA does not cause permanent damage to the brain. The symptoms of a TIA can happen very fast and do not last long. It is important to know the symptoms of a TIA and what to do. This can help prevent a major  stroke or death. CAUSES   A TIA is caused by a temporary blockage in an artery in the brain or neck (carotid artery). The blockage does not allow the brain to get the blood supply it needs and can cause different symptoms. The blockage can be caused by either:  A blood clot.  Fatty buildup (plaque) in a neck or brain artery. RISK FACTORS  High blood pressure (hypertension).  High cholesterol.  Diabetes mellitus.  Heart disease.  The build up of plaque in the blood vessels (peripheral artery disease or atherosclerosis).  The build up of plaque in the blood vessels providing blood and oxygen to the brain (carotid artery stenosis).  An abnormal heart rhythm (atrial fibrillation).  Obesity.  Smoking.  Taking oral contraceptives (especially in combination with smoking).  Physical inactivity.  A diet high in fats, salt (sodium), and calories.  Alcohol use.  Use of illegal drugs (especially cocaine and methamphetamine).  Being female.  Being African American.  Being over the age of 74.  Family history of stroke.  Previous history of blood clots, stroke, TIA, or heart attack.  Sickle cell disease. SYMPTOMS  TIA symptoms are the same as a stroke but are temporary. These symptoms usually develop suddenly, or may be newly present upon awakening from sleep:  Sudden weakness or numbness of the face, arm, or leg, especially on one side of the body.  Sudden trouble walking or difficulty moving arms or legs.  Sudden confusion.  Sudden personality changes.  Trouble speaking (aphasia) or understanding.  Difficulty swallowing.  Sudden trouble seeing in one or both eyes.  Double vision.  Dizziness.  Loss of balance or coordination.  Sudden severe headache with no known cause.  Trouble reading or writing.  Loss of bowel or bladder control.  Loss of consciousness. DIAGNOSIS  Your caregiver may be able to determine the presence or absence of a TIA based on your  symptoms, history, and physical exam. Computed tomography (CT scan) of the brain is usually performed to help identify a TIA. Other tests may be done to diagnose a TIA. These tests may include:  Electrocardiography.  Continuous heart monitoring.  Echocardiography.  Carotid ultrasonography.  Magnetic resonance imaging (MRI).  A scan of the brain circulation.  Blood tests. PREVENTION  The risk of a TIA can be decreased by appropriately treating high blood pressure, high cholesterol, diabetes, heart disease, and obesity and by quitting smoking, limiting alcohol, and staying physically active. TREATMENT  Time is of the essence. Since the symptoms of TIA are the same as a stroke, it is important to seek treatment within 3 4 hours of the start of symptoms because you may receive a medicine to dissolve the clot (thrombolytic) that cannot be given after that time. Treatment options vary. Treatment options may include rest, oxygen, intravenous (IV) fluids, and medicines to thin the blood (anticoagulants). Medicines and diet may be used to address diabetes, high blood pressure, and other risk factors. Measures will be taken to prevent short-term and long-term complications, including infection from breathing foreign  material into the lungs (aspiration pneumonia), blood clots in the legs, and falls. Treatment options include procedures to either remove plaque in the carotid arteries or dilate carotid arteries that have narrowed due to plaque. Those procedures are:  Carotid endarterectomy.  Carotid angioplasty and stenting. HOME CARE INSTRUCTIONS   Take all medicines prescribed by your caregiver. Follow the directions carefully. Medicines may be used to control risk factors for a stroke. Be sure you understand all your medicine instructions.  You may be told to take aspirin or the anticoagulant warfarin. Warfarin needs to be taken exactly as instructed.  Taking too much or too little warfarin is  dangerous. Too much warfarin increases the risk of bleeding. Too little warfarin continues to allow the risk for blood clots. While taking warfarin, you will need to have regular blood tests to measure your blood clotting time. A PT blood test measures how long it takes for blood to clot. Your PT is used to calculate another value called an INR. Your PT and INR help your caregiver to adjust your dose of warfarin. The dose can change for many reasons. It is critically important that you take warfarin exactly as prescribed.  Many foods, especially foods high in vitamin K can interfere with warfarin and affect the PT and INR. Foods high in vitamin K include spinach, kale, broccoli, cabbage, collard and turnip greens, brussels sprouts, peas, cauliflower, seaweed, and parsley as well as beef and pork liver, green tea, and soybean oil. You should eat a consistent amount of foods high in vitamin K. Avoid major changes in your diet, or notify your caregiver before changing your diet. Arrange a visit with a dietitian to answer your questions.  Many medicines can interfere with warfarin and affect the PT and INR. You must tell your caregiver about any and all medicines you take, this includes all vitamins and supplements. Be especially cautious with aspirin and anti-inflammatory medicines. Do not take or discontinue any prescribed or over-the-counter medicine except on the advice of your caregiver or pharmacist.  Warfarin can have side effects, such as excessive bruising or bleeding. You will need to hold pressure over cuts for longer than usual. Your caregiver or pharmacist will discuss other potential side effects.  Avoid sports or activities that may cause injury or bleeding.  Be mindful when shaving, flossing your teeth, or handling sharp objects.  Alcohol can change the body's ability to handle warfarin. It is best to avoid alcoholic drinks or consume only very small amounts while taking warfarin. Notify your  caregiver if you change your alcohol intake.  Notify your dentist or other caregivers before procedures.  Eat a diet that includes 5 or more servings of fruits and vegetables each day. This may reduce the risk of stroke. Certain diets may be prescribed to address high blood pressure, high cholesterol, diabetes, or obesity.  A low-sodium, low-saturated fat, low-trans fat, low-cholesterol diet is recommended to manage high blood pressure.  A low-saturated fat, low-trans fat, low-cholesterol, and high-fiber diet may control cholesterol levels.  A controlled-carbohydrate, controlled-sugar diet is recommended to manage diabetes.  A reduced-calorie, low-sodium, low-saturated fat, low-trans fat, low-cholesterol diet is recommended to manage obesity.  Maintain a healthy weight.  Stay physically active. It is recommended that you get at least 30 minutes of activity on most or all days.  Do not smoke.  Limit alcohol use even if you are not taking warfarin. Moderate alcohol use is considered to be:  No more than 2 drinks each day  for men.  No more than 1 drink each day for nonpregnant women.  Stop drug abuse.  Home safety. A safe home environment is important to reduce the risk of falls. Your caregiver may arrange for specialists to evaluate your home. Having grab bars in the bedroom and bathroom is often important. Your caregiver may arrange for equipment to be used at home, such as raised toilets and a seat for the shower.  Follow all instructions for follow-up with your caregiver. This is very important. This includes any referrals and lab tests. Proper follow up can prevent a stroke or another TIA from occurring. SEEK MEDICAL CARE IF:  You have personality changes.  You have difficulty swallowing.  You are seeing double.  You have dizziness.  You have a fever.  You have skin breakdown. SEEK IMMEDIATE MEDICAL CARE IF:  Any of these symptoms may represent a serious problem that is  an emergency. Do not wait to see if the symptoms will go away. Get medical help right away. Call your local emergency services (911 in [place] Do not drive yourself to the hospital.  You have sudden weakness or numbness of the face, arm, or leg, especially on one side of the body.  You have sudden trouble walking or difficulty moving arms or legs.  You have sudden confusion.  You have trouble speaking (aphasia) or understanding.  You have sudden trouble seeing in one or both eyes.  You have a loss of balance or coordination.  You have a sudden, severe headache with no known cause.  You have new chest pain or an irregular heartbeat.  You have a partial or total loss of consciousness. MAKE SURE YOU:   Understand these instructions.  Will watch your condition.  Will get help right away if you are not doing well or get worse. Document Released: 07/11/2005 Document Revised: 09/17/2012 Document Reviewed: 11/24/2009 [facility] Patient Information 2014 [place].

## 2014-02-04 NOTE — Discharge Summary (Signed)
[name], [name]                ACCOUNT NO.:  [id number]  MEDICAL RECORD NO.:  [id number]  LOCATION:  [id number]                        FACILITY:  [facility]  PHYSICIAN:  Jesenya Bowditch N. Terrence Dupont, M.D. DATE OF BIRTH:  [birth year]  DATE OF ADMISSION:  02/02/2014 DATE OF DISCHARGE:  02/04/2014                              DISCHARGE SUMMARY   ADMITTING DIAGNOSES: 1. Status post TIA, rule out CVA. 2. Uncontrolled hypertension. 3. Hypercholesteremia. 4. History of syncope in the past. 5. Gastroesophageal reflux disease. 6. History of Crohn disease.  DISCHARGE DIAGNOSES: 1. Status post TIA ruled out for CVA. 2. Hypertension. 3. Hypercholesteremia. 4. History of syncope in the past, extensive workup negative in the     past. 5. Gastroesophageal reflux disease. 6. History of Crohn disease.  DISCHARGE HOME MEDICATIONS:  Atorvastatin 20 mg 1 tablet daily, losartan 50 mg 1 tablet daily, amlodipine 5 mg 1 tablet daily, aspirin 81 mg 1 tablet daily, Isopto tear drops as before.  DIET:  Low salt, low cholesterol.  ACTIVITY:  Increase activity as tolerated.  CONDITION AT DISCHARGE:  Stable.  FOLLOWUP:  With me in 1 week.  BRIEF HISTORY AND HOSPITAL COURSE:  [name] is a 79 year old female with past medical history significant for hypertension, hypercholesteremia, history of syncope in the past, GERD, history of Crohn disease.  She came to ER by EMS complaining of generalized weakness associated with blurring of vision and dizziness and unsteady gait, associated with nausea and was noted to have elevated blood pressure early this morning at home.  While coming to the ED, the patient felt dizzy and weak, pulled over the car, and called EMS and came to the ER.  The patient denies any weakness in the left or right side of the arms or legs.  Denies any slurred speech.  Denies any headache or seizure activity.  Denies any palpitation or syncopal episode.  The patient states she had similar  presentation few years ago while in [place] and had extensive workup, which was negative.  The patient presently just complains of cramps in her legs.  PAST MEDICAL HISTORY:  As above.  PAST SURGICAL HISTORY:  She had partial colectomy in the past for Crohn disease.  PHYSICAL EXAMINATION:  GENERAL:  She was alert, awake, oriented x3. VITAL SIGNS:  Blood pressure was 169/90, pulse was 68.  She was afebrile. HEENT:  Conjunctivae was pink. NECK:  Supple.  No JVD.  No bruit. LUNGS:  Clear to auscultation without rhonchi or rales. CARDIOVASCULAR:  S1, S2 was normal.  There was soft systolic murmur and S4, gallop. ABDOMEN:  Soft.  Bowel sounds present, nontender. EXTREMITIES:  There is no clubbing, cyanosis, or edema. NEUROLOGIC:  She was alert, awake, oriented x3.  Neuro exam was grossly intact.  LABS:  Her sodium was 142, potassium 4.4, BUN 17, creatinine 1.0.  Blood sugar was 83.  Cholesterol was high at 217, LDL was 88, HDL 111, triglycerides were 88.  Hemoglobin was 12.7, hematocrit 39, white count of 5.8.  The patient had CT of the chest which showed no acute intracranial abnormalities, stable appearance from 2013, mild age related changes were noted.  MRI of the brain showed no  acute intracranial abnormality, moderate for age, nonspecific signal changes in the brain, negative intracranial MRA.  The patient had 2D echo, which showed good LV systolic function with mild LVH and grade 1 diastolic dysfunction.  Duplex carotid ultrasound showed no evidence of critical stenosis.  BRIEF HOSPITAL COURSE:  The patient was admitted to telemetry unit.  The patient did not have any further episodes of blurring of vision or dizziness.  OT/PT consultation was obtained.  The patient has been ambulating in room without any problems.  The patient did not have any gait disturbances during the hospital stay, nor she had any weakness in the arms or legs.  The patient will be discharged home on  above medications, discussed with patient regarding diet, lifestyle modification, and compliance with medication, and strict control of blood pressure.     Allegra Lai. Terrence Dupont, M.D.     MNH/MEDQ  D:  02/04/2014  T:  02/04/2014  Job:  [id number]

## 2014-02-04 NOTE — Discharge Summary (Signed)
  Discharge summary dictated on 02/04/2014 dictation number is [phone]

## 2014-02-10 NOTE — Progress Notes (Addendum)
Occupational Therapy Evaluation Addendum    02-07-2014 1300  OT G-codes **NOT FOR INPATIENT CLASS**  Functional Limitation Self care  Self Care Current Status [phone]) [facility]  Self Care Goal Status [phone]) [facility]  Self Care Discharge Status [phone]) [place], OTR/L [phone]

## 2014-02-19 NOTE — ED Provider Notes (Signed)
Medical screening examination/treatment/procedure(s) were performed by non-physician practitioner and as supervising physician I was immediately available for consultation/collaboration.   EKG Interpretation   Date/Time:  Tuesday February 02 2014 10:19:29 EDT Ventricular Rate:  62 PR Interval:  195 QRS Duration: 85 QT Interval:  432 QTC Calculation: 439 R Axis:   -33 Text Interpretation:  Sinus rhythm Left axis deviation Low voltage,  extremity and precordial leads Abnormal R-wave progression, early  transition No significant change since last tracing Confirmed by KNAPP   MD-J, JON ([id number]) on 02/02/2014 6:45:30 PM       Virgel Manifold, MD 02/19/14 2202

## 2014-02-25 NOTE — Progress Notes (Signed)
02/03/14 1000  SLP G-Codes **NOT FOR INPATIENT CLASS**  Functional Assessment Tool Used (clinical judgement)  Functional Limitations Spoken language comprehension  Spoken Language Comprehension Current Status [phone]) [facility]  Spoken Language Comprehension Goal Status (X0271) [place]  Spoken Language Comprehension Discharge Status (A2320) [place]  SLP Evaluations  $ SLP Speech Visit 1 Procedure  SLP Evaluations  $ SLP EVAL LANGUAGE/SOUND PRODUCTION 1 Procedure

## 2014-04-02 IMAGING — CT CT ABD-PELV W/ CM
3 of 5 series · 11 of 36 positions shown, 17 images · IV contrast (READICAT/WATER & [ID] OMNI 300)
Comparison: CT of the abdomen and pelvis 02/14/2011.

***ADDENDUM*** CREATED: 05/07/2013 [DATE]

Upon further review, when compared to the prior study from
02/14/2011, the probable loculated fluid collection on image 54 of
series 3 appears grossly unchanged.  In retrospect, image 47 of
series 3 also demonstrates small low attenuation collections in the
anterior abdominal mesentery measuring approximately 12 x 9 mm and
16 x 14 mm which appears similar to image 47 of series 3 of the
prior study.  These are of uncertain etiology and significance, but
are favored to represent benign peritoneal loculated fluid
collections, particularly given the lack of interval growth
compared to the prior study from 2482.
***END ADDENDUM*** SIGNED BY: Pathagamage Surovtseva, M.D.
CLINICAL DATA: Upper abdominal pain for the past 2 weeks.  Nausea.
History of Crohn disease.
CT ABDOMEN AND PELVIS WITH CONTRAST
TECHNIQUE: Multidetector CT imaging of the abdomen and pelvis was
performed following the standard protocol during bolus
administration of intravenous contrast.
Contrast: 100mL OMNIPAQUE IOHEXOL 300 MG/ML  SOLN

[Series 3: abd/pelvis with · axial · 0.59mm/px · z∈[-278,+7]mm · 8 of 75 slices shown, 13 images]
[im 9/75  soft-tissue]
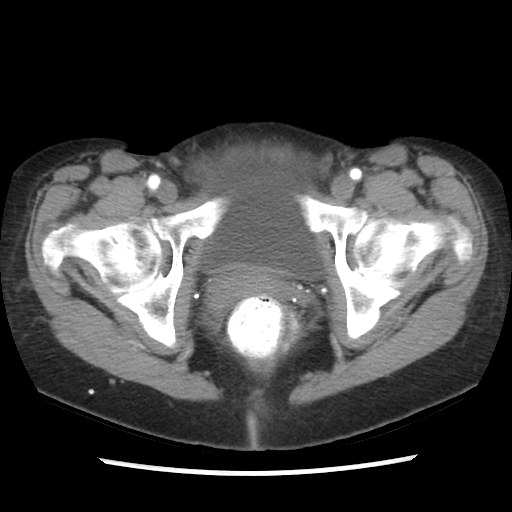
[im 9/75  bone]
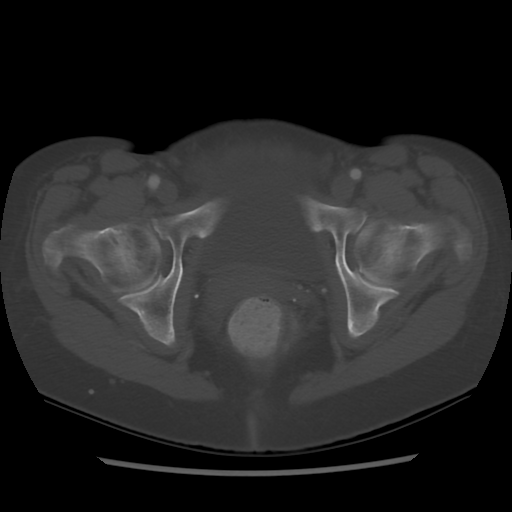
[im 17/75  soft-tissue]
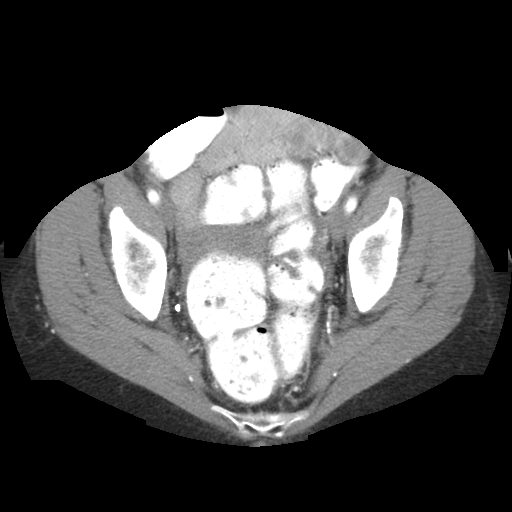
[im 25/75  soft-tissue]
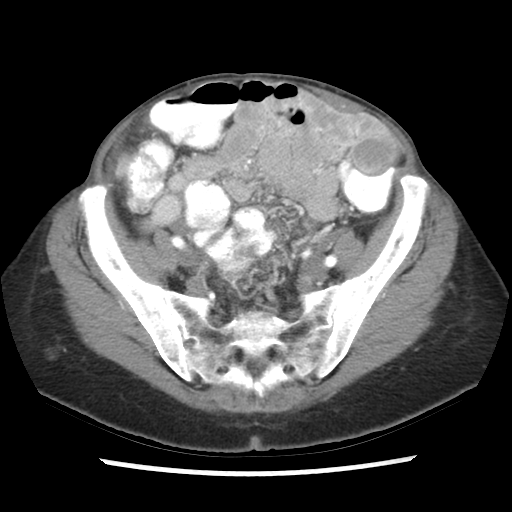
[im 33/75  soft-tissue]
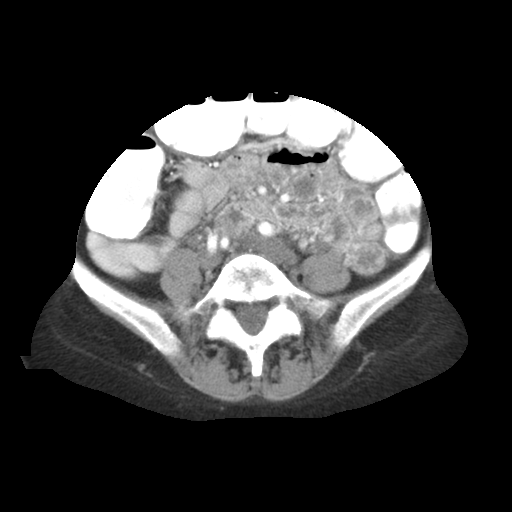
[im 42/75  soft-tissue]
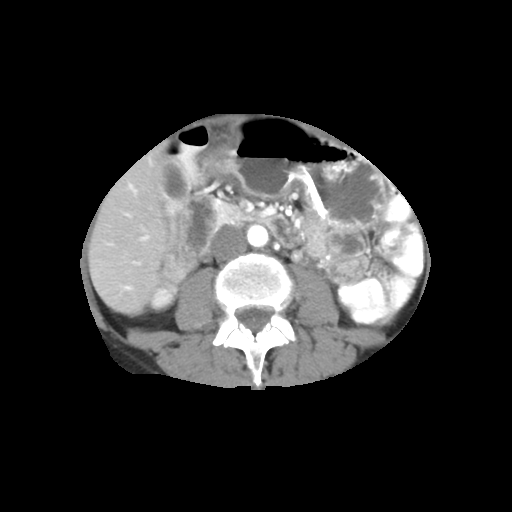
[im 42/75  lung]
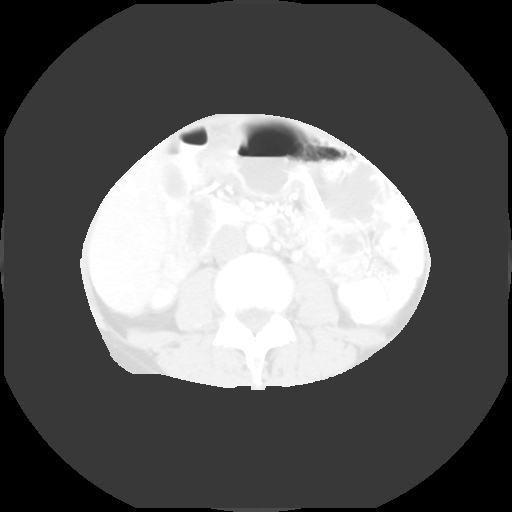
[im 50/75  soft-tissue]
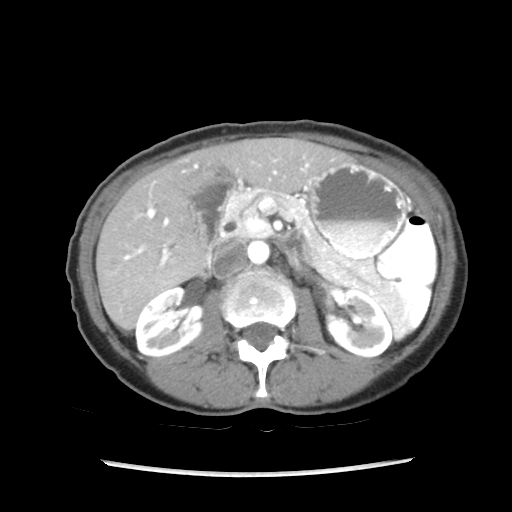
[im 50/75  lung]
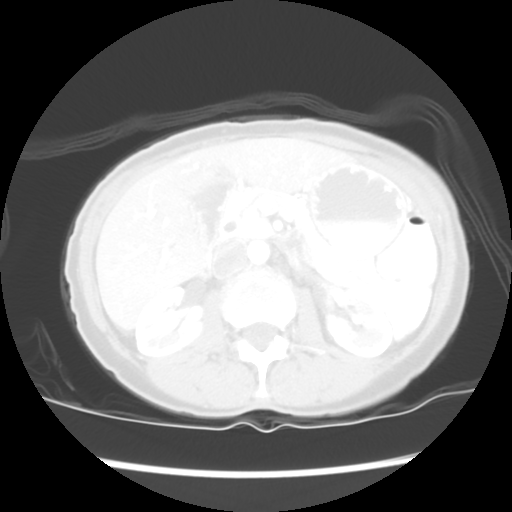
[im 58/75  soft-tissue]
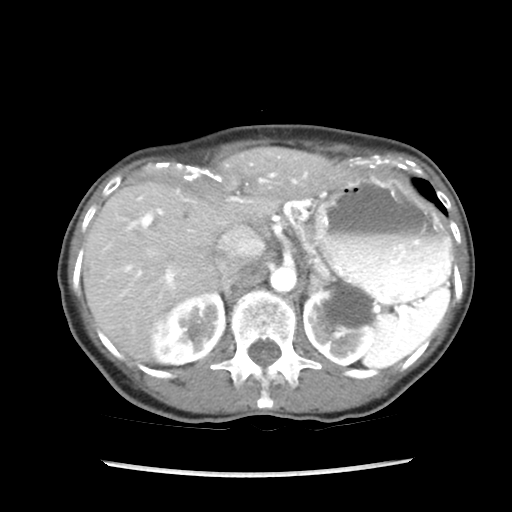
[im 58/75  lung]
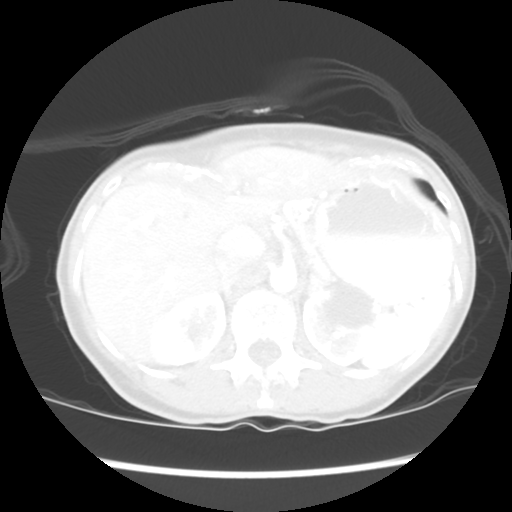
[im 66/75  soft-tissue]
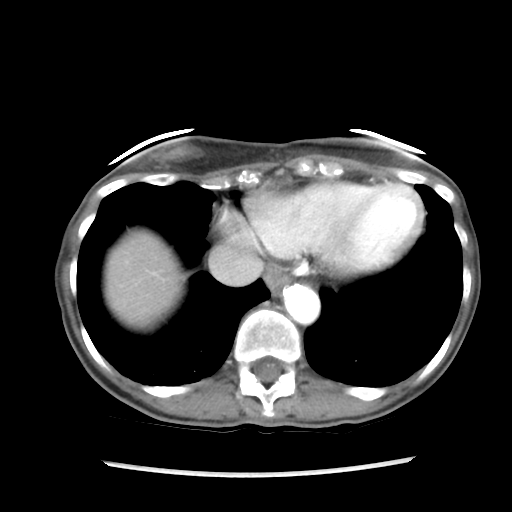
[im 66/75  lung]
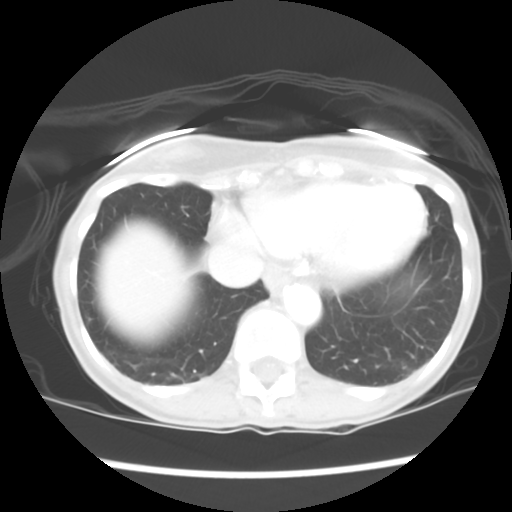

[Series 601: coronal body · coronal · 0.78mm/px · 1 of 93 slices shown, 2 images]
[im 31/93  soft-tissue]
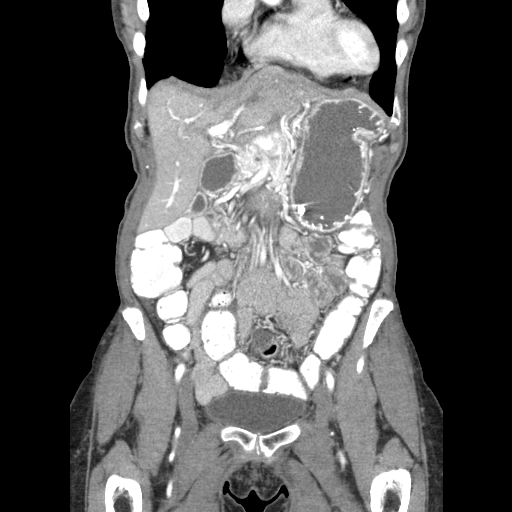
[im 31/93  bone]
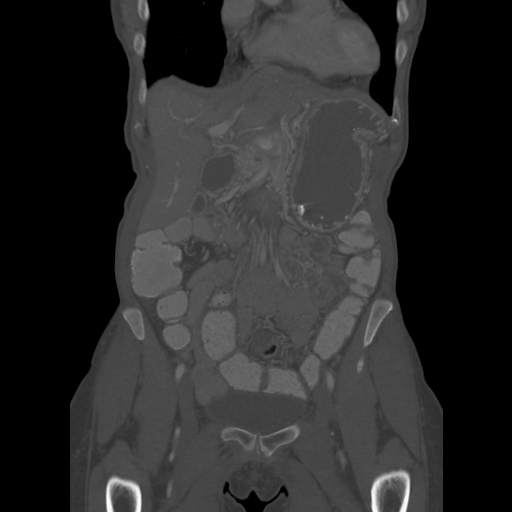

[Series 602: sagittal body · sagittal · 0.78mm/px · 2 of 121 slices shown]
[im 9/121  soft-tissue]
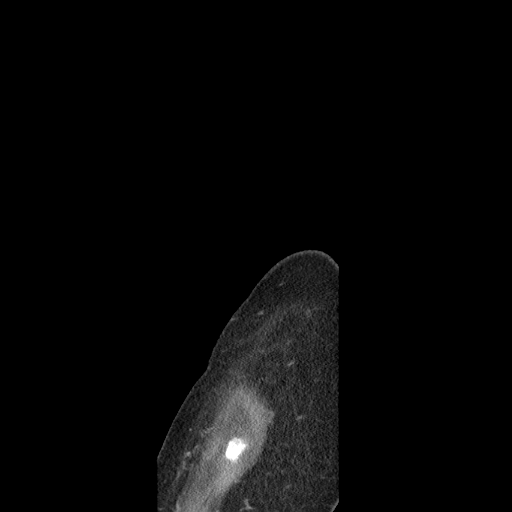
[im 25/121  soft-tissue]
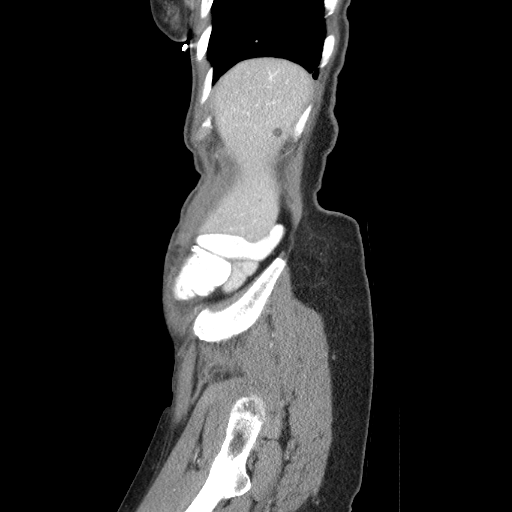

[11 of 36 positions shown; findings below may reference images not displayed]

FINDINGS: Lung Bases: Linear opacity in the left lower lobe is unchanged,
compatible with an area of mild scarring.  Atherosclerosis of the
distal thoracic aorta.

Abdomen/Pelvis:  Mild diffuse periportal edema in the liver.
Multifocal subcentimeter low attenuation hepatic lesions appears
similar to the prior study, and although too small to definitively
characterize, these are favored to represent small cysts or biliary
hamartomas.  Gallbladder is unremarkable in appearance.  Common
bile duct measures 7 mm within the porta hepatis, which is likely
within normal limits for the patient's age.  The appearance of the
pancreas, spleen and bilateral adrenal glands is unremarkable.
There are numerous subcentimeter low attenuation lesions in the
kidneys bilaterally which are too small to definitively
characterize, but are similar to the prior study, and are favored
to represent tiny cysts.  In addition, there is a 2.8 cm low and
intermediate attenuation (26 HU) lesion in the upper pole of the
left kidney which is only slightly larger than the prior
examination, favored to represent a proteinaceous cyst.

No significant volume of ascites.  No pneumoperitoneum.  No
pathologic distension of small bowel.  No definite pathologic
lymphadenopathy identified within the abdomen or pelvis.
Atherosclerosis throughout the abdominal and pelvic vasculature,
without definite aneurysm or dissection.  A well-circumscribed
cm load intermediate attenuation (24 HU) lesion in the central
mesentery (image 54 series 3) is of uncertain etiology and
significance, but has a enign appearance. Uterus and ovaries are
unremarkable in appearance.

Musculoskeletal: There are no aggressive appearing lytic or blastic
lesions noted in the visualized portions of the skeleton.
IMPRESSION: 1.  Mild periportal edema in the liver.  Correlation with liver
function tests is recommended.
2.  Normal appearance of the gallbladder.
3. Numerous subcentimeter low attenuation lesions in the liver and
kidneys bilaterally too small to definitively characterize, but are
favored to represent small cysts.  In addition, there is a 2.8 cm
mildly proteinaceous cyst in the upper pole of the left kidney.
4.  1.5 cm well defined low attenuation lesion in the central
abdominal mesentery is of uncertain etiology and significance, but
has a benign appearance and is of doubtful clinical significance.
This may represent a tiny loculated volume of fluid.

## 2014-04-02 NOTE — ED Provider Notes (Signed)
Medical screening examination/treatment/procedure(s) were performed by non-physician practitioner and as supervising physician I was immediately available for consultation/collaboration.   EKG Interpretation   Date/Time:  Tuesday February 02 2014 10:19:29 EDT Ventricular Rate:  62 PR Interval:  195 QRS Duration: 85 QT Interval:  432 QTC Calculation: 439 R Axis:   -33 Text Interpretation:  Sinus rhythm Left axis deviation Low voltage,  extremity and precordial leads Abnormal R-wave progression, early  transition No significant change since last tracing Confirmed by KNAPP   MD-J, JON ([id number]) on 02/02/2014 6:45:30 PM       Virgel Manifold, MD 04/02/14 1038

## 2015-02-02 ENCOUNTER — Other Ambulatory Visit: Payer: Self-pay

## 2015-07-09 ENCOUNTER — Encounter (HOSPITAL_COMMUNITY): Payer: Self-pay | Admitting: Emergency Medicine

## 2015-07-09 ENCOUNTER — Emergency Department (HOSPITAL_COMMUNITY)
Admission: EM | Admit: 2015-07-09 | Discharge: 2015-07-09 | Disposition: A | Discharge status: Home / Self Care | Payer: Medicare Other | Attending: Emergency Medicine | Admitting: Emergency Medicine

## 2015-07-09 DIAGNOSIS — I10 Essential (primary) hypertension: Secondary | ICD-10-CM | POA: Insufficient documentation

## 2015-07-09 DIAGNOSIS — Z79899 Other long term (current) drug therapy: Secondary | ICD-10-CM | POA: Diagnosis not present

## 2015-07-09 DIAGNOSIS — Z7982 Long term (current) use of aspirin: Secondary | ICD-10-CM | POA: Diagnosis not present

## 2015-07-09 DIAGNOSIS — M25512 Pain in left shoulder: Secondary | ICD-10-CM | POA: Insufficient documentation

## 2015-07-09 DIAGNOSIS — Z8719 Personal history of other diseases of the digestive system: Secondary | ICD-10-CM | POA: Insufficient documentation

## 2015-07-09 MED ORDER — DICLOFENAC SODIUM 1 % TD GEL: 2.0000 g | Freq: Four times a day (QID) | TRANSDERMAL | Status: DC

## 2015-07-09 MED ORDER — ACETAMINOPHEN 500 MG PO TABS
1000.0000 mg | ORAL_TABLET | Freq: Once | ORAL | Status: AC
  Administered 2015-07-09: 1000 mg via ORAL
  Filled 2015-07-09: qty 2

## 2015-07-09 NOTE — Discharge Instructions (Signed)
Shoulder Pain Follow up with your primary care physician. Useful Voltaren cream as needed for pain. Continue taking Tylenol until you follow up with your physician. The shoulder is the joint that connects your arm to your body. Muscles and band-like tissues that connect bones to muscles (tendons) hold the joint together. Shoulder pain is felt if an injury or medical problem affects one or more parts of the shoulder. HOME CARE   Put ice on the sore area.  Put ice in a plastic bag.  Place a towel between your skin and the bag.  Leave the ice on for 15-20 minutes, 03-04 times a day for the first 2 days.  Stop using cold packs if they do not help with the pain.  If you were given something to keep your shoulder from moving (sling; shoulder immobilizer), wear it as told. Only take it off to shower or bathe.  Move your arm as little as possible, but keep your hand moving to prevent puffiness (swelling).  Squeeze a soft ball or foam pad as much as possible to help prevent swelling.  Take medicine as told by your doctor. GET HELP IF:  You have progressing new pain in your arm, hand, or fingers.  Your hand or fingers get cold.  Your medicine does not help lessen your pain. GET HELP RIGHT AWAY IF:   Your arm, hand, or fingers are numb or tingling.  Your arm, hand, or fingers are puffy (swollen), painful, or turn white or blue. MAKE SURE YOU:   Understand these instructions.  Will watch your condition.  Will get help right away if you are not doing well or get worse. Document Released: 03/19/2008 Document Revised: 02/15/2014 Document Reviewed: 04/14/2012 [facility] Patient Information 2015 [place], [place]. This information is not intended to replace advice given to you by your health care provider. Make sure you discuss any questions you have with your health care provider.

## 2015-07-09 NOTE — ED Provider Notes (Addendum)
Medical screening examination/treatment/procedure(s) were performed by non-physician practitioner and as supervising physician I was immediately available for consultation/collaboration.   EKG Interpretation None     Patient here with left musculoskeletal shoulder plain. Exam shows ttp at left scapula. Neurovascular intact stable for discharge  Lacretia Leigh, MD 07/09/15 1036  Lacretia Leigh, MD 07/09/15 1036

## 2015-07-09 NOTE — ED Notes (Signed)
Reports shoulder pain for a couple of weeks; increased pain this morning when she woke up, thinks she slept on it all night. Pain moves up into neck. Can move arm, pain only occurs with abduction. No deformity noted. No swelling noted. Denies any known injury.

## 2015-07-09 NOTE — ED Provider Notes (Signed)
CSN: [id number]     Arrival date & time 07/09/15  0768 History   First MD Initiated Contact with Patient 07/09/15 1010     Chief Complaint  Patient presents with  . Shoulder Pain     (Consider location/radiation/quality/duration/timing/severity/associated sxs/prior Treatment) The history is provided by the patient. No language interpreter was used.   [name] is a 80 year old female with a history of hypertension and Crohn's disease who presents with left shoulder pain that began a couple of weeks ago. She states the pain increased this morning when she woke up and thinks that she may have slept on it wrong. After further discussion she states she has been doing some reaching and repetitive motion while helping her husband recently. She states that she has taken 1022m ofTylenol for the pain every 12 hours which helps but then returns once the Tylenol wears off. She denies any injury or fall. She denies any fever, chills, chest pain, shortness of breath, numbness or tingling to the arm. Past Medical History  Diagnosis Date  . Hypertension   . Crohn disease   . Crohn's disease    Past Surgical History  Procedure Laterality Date  . Colectomy    . Bowel resection     History reviewed. No pertinent family history. Social History  Substance Use Topics  . Smoking status: Never Smoker   . Smokeless tobacco: None  . Alcohol Use: No   OB History    No data available     Review of Systems  Musculoskeletal: Positive for myalgias and arthralgias.  Neurological: Negative for weakness and numbness.      Allergies  Sulfa antibiotics and Codeine  Home Medications   Prior to Admission medications   Medication Sig Start Date End Date Taking? Authorizing Provider  amLODipine (NORVASC) 5 MG tablet Take 5 mg by mouth daily.    Historical Provider, MD  aspirin 81 MG chewable tablet Chew 81 mg by mouth daily.    Historical Provider, MD  atorvastatin (LIPITOR) 20 MG tablet Take 1 tablet  (20 mg total) by mouth daily at 6 PM. 02/04/14   Rhonda Forward MD  diclofenac sodium (VOLTAREN) 1 % GEL Apply 2 g topically 4 (four) times daily. 07/09/15   Rhonda Patel-Mills, PA-C  hydroxypropyl methylcellulose (ISOPTO TEARS) 2.5 % ophthalmic solution Place 1 drop into both eyes 4 (four) times daily as needed for dry eyes.    Historical Provider, MD  losartan (COZAAR) 50 MG tablet Take 1 tablet (50 mg total) by mouth daily. 02/04/14   Rhonda Forward MD   BP 150/86 mmHg  Pulse 69  Temp(Src) 98 F (36.7 C) (Oral)  Resp 18  SpO2 100% Physical Exam  Constitutional: She is oriented to person, place, and time. She appears well-developed and well-nourished.  HENT:  Head: Normocephalic.  Eyes: Conjunctivae are normal.  Neck: Normal range of motion.  Cardiovascular: Normal rate.   Pulmonary/Chest: Effort normal. No respiratory distress.  Musculoskeletal: Normal range of motion.  Left shoulder: No deformity or tenderness of the clavicle or acromion. She has reproducible tenderness along the proximal humerus. Pain with abduction at 90 and with reaching. She is able to flex and extend at the elbow. 2+ pulses.  Neurological: She is alert and oriented to person, place, and time.  Skin: Skin is warm and dry.  Psychiatric: She has a normal mood and affect. Her behavior is normal.  Nursing note and vitals reviewed.   ED Course  Procedures (including critical care time) Labs  Review Labs Reviewed - No data to display  Imaging Review No results found.    EKG Interpretation None      MDM   Final diagnoses:  Shoulder pain, acute, left   Patient presents with left musculoskeletal reproducible pain in the left shoulder. I do not believe this is cardiac in nature. She has had this pain for a couple of weeks now and woke up this morning with worsening pain. Pain is worse with movement. This is most likely due to overuse of the arm. I spoke to Dr. Zenia Resides regarding this patient who has seen the  patient and agrees with the plan. She can continue taking Tylenol for pain and following up with her PCP. I have given her Voltaren cream. She verbally agrees with the plan.    Rhonda Glazier, PA-C 07/09/15 1102

## 2015-09-07 ENCOUNTER — Encounter (HOSPITAL_COMMUNITY): Payer: Self-pay | Admitting: Emergency Medicine

## 2015-09-07 ENCOUNTER — Emergency Department (HOSPITAL_COMMUNITY)
Admission: EM | Admit: 2015-09-07 | Discharge: 2015-09-07 | Disposition: A | Discharge status: Home / Self Care | Payer: Medicare Other | Attending: Emergency Medicine | Admitting: Emergency Medicine

## 2015-09-07 DIAGNOSIS — I10 Essential (primary) hypertension: Secondary | ICD-10-CM | POA: Insufficient documentation

## 2015-09-07 DIAGNOSIS — R21 Rash and other nonspecific skin eruption: Secondary | ICD-10-CM | POA: Diagnosis present

## 2015-09-07 DIAGNOSIS — Z7982 Long term (current) use of aspirin: Secondary | ICD-10-CM | POA: Insufficient documentation

## 2015-09-07 DIAGNOSIS — R238 Other skin changes: Secondary | ICD-10-CM | POA: Diagnosis not present

## 2015-09-07 DIAGNOSIS — Z79899 Other long term (current) drug therapy: Secondary | ICD-10-CM | POA: Insufficient documentation

## 2015-09-07 DIAGNOSIS — Z8719 Personal history of other diseases of the digestive system: Secondary | ICD-10-CM | POA: Insufficient documentation

## 2015-09-07 DIAGNOSIS — L299 Pruritus, unspecified: Secondary | ICD-10-CM

## 2015-09-07 MED ORDER — CETIRIZINE HCL 10 MG PO TABS: 10.0000 mg | ORAL_TABLET | Freq: Every day | ORAL | Status: DC

## 2015-09-07 NOTE — ED Provider Notes (Signed)
CSN: [id number]     Arrival date & time 09/07/15  41 History   First MD Initiated Contact with Patient 09/07/15 1055     Chief Complaint  Patient presents with  . Medication Reaction      HPI  Patient presents for evaluation of itching and rash. Had a reaction to topical diclofenac in September. Has not taken it since. She states 3 days ago she developed a rash under her chin and itching on her head and her feet. She thinks she had a rash" definitely red. This is not been present for the last 2 days she presents here asymptomatic.  Past Medical History  Diagnosis Date  . Hypertension   . Crohn disease ([place])   . Crohn's disease [facility])    Past Surgical History  Procedure Laterality Date  . Colectomy    . Bowel resection     History reviewed. No pertinent family history. Social History  Substance Use Topics  . Smoking status: Never Smoker   . Smokeless tobacco: None  . Alcohol Use: No   OB History    No data available     Review of Systems  Constitutional: Negative for fever, chills, diaphoresis, appetite change and fatigue.  HENT: Negative for mouth sores, sore throat and trouble swallowing.   Eyes: Negative for visual disturbance.  Respiratory: Negative for cough, chest tightness, shortness of breath and wheezing.   Cardiovascular: Negative for chest pain.  Gastrointestinal: Negative for nausea, vomiting, abdominal pain, diarrhea and abdominal distention.  Endocrine: Negative for polydipsia, polyphagia and polyuria.  Genitourinary: Negative for dysuria, frequency and hematuria.  Musculoskeletal: Negative for gait problem.  Skin: Negative for color change, pallor and rash.       Itching. States she had blisters on her chin 3 days ago. No evidence of this today.  Neurological: Negative for dizziness, syncope, light-headedness and headaches.  Hematological: Does not bruise/bleed easily.  Psychiatric/Behavioral: Negative for behavioral problems and confusion.       Allergies  Sulfa antibiotics and Codeine  Home Medications   Prior to Admission medications   Medication Sig Start Date End Date Taking? Authorizing Provider  amLODipine (NORVASC) 5 MG tablet Take 5 mg by mouth daily.   Yes Historical Provider, MD  aspirin 81 MG chewable tablet Chew 81 mg by mouth daily.   Yes Historical Provider, MD  atorvastatin (LIPITOR) 20 MG tablet Take 1 tablet (20 mg total) by mouth daily at 6 PM. Patient not taking: Reported on 09/07/2015 02/04/14   Charolette Forward, MD  diclofenac sodium (VOLTAREN) 1 % GEL Apply 2 g topically 4 (four) times daily. Patient not taking: Reported on 09/07/2015 07/09/15   Ottie Glazier, PA-C  hydroxypropyl methylcellulose (ISOPTO TEARS) 2.5 % ophthalmic solution Place 1 drop into both eyes 4 (four) times daily as needed for dry eyes.    Historical Provider, MD  losartan (COZAAR) 50 MG tablet Take 1 tablet (50 mg total) by mouth daily. Patient not taking: Reported on 09/07/2015 02/04/14   Charolette Forward, MD   BP 169/92 mmHg  Pulse 72  Temp(Src) 97.6 F (36.4 C) (Oral)  Resp 16  SpO2 100% Physical Exam  Constitutional: She is oriented to person, place, and time. She appears well-developed and well-nourished. No distress.  HENT:  Head: Normocephalic.  Eyes: Conjunctivae are normal. Pupils are equal, round, and reactive to light. No scleral icterus.  Neck: Normal range of motion. Neck supple. No thyromegaly present.  Cardiovascular: Normal rate and regular rhythm.  Exam reveals no  gallop and no friction rub.   No murmur heard. Pulmonary/Chest: Effort normal and breath sounds normal. No respiratory distress. She has no wheezes. She has no rales.  Abdominal: Soft. Bowel sounds are normal. She exhibits no distension. There is no tenderness. There is no rebound.  Musculoskeletal: Normal range of motion.  Neurological: She is alert and oriented to person, place, and time.  Skin: Skin is warm and dry. No rash noted.  No rash.  Skin does not appear eczematous or dry. She reports blisters on the skin of her chin 2 days ago no evidence of this now  Psychiatric: She has a normal mood and affect. Her behavior is normal.    ED Course  Procedures (including critical care time) Labs Review Labs Reviewed - No data to display  Imaging Review No results found. I have personally reviewed and evaluated these images and lab results as part of my medical decision-making.   EKG Interpretation None      MDM   Final diagnoses:  Itching    Patient with no symptoms, or abnormal physical exam findings currently. No history of medication changes or any obvious antigen triggers in her history. This does not appear to be anxiety. Plan will be daily Zyrtec. Primary care if not resolving or if recurring regarding allergy testing.    Tanna Furry, MD 09/07/15 [phone]

## 2015-09-07 NOTE — Discharge Instructions (Signed)
Recheck with your primary care physician. Zyrtec daily for 1 week.  If your symptoms do not recur you may stop.  If your symptoms return, please check with your primary care physician regarding allergy testing. Return to the ER with any itching or rash associated with mouth, tongue, or throat swelling, or any difficult breathing.  Pruritus Pruritus is an itching feeling. There are many different conditions and factors that can make your skin itchy. Dry skin is one of the most common causes of itching. Most cases of itching do not require medical attention. Itchy skin can turn into a rash.  HOME CARE INSTRUCTIONS  Watch your pruritus for any changes. Take these steps to help with your condition:  Skin Care  Moisturize your skin as needed. A moisturizer that contains petroleum jelly is best for keeping moisture in your skin.  Take or apply medicines only as directed by your health care provider. This may include:  Corticosteroid cream.  Anti-itch lotions.  Oral anti-histamines.  Apply cool compresses to the affected areas.  Try taking a bath with:  Epsom salts. Follow the instructions on the packaging. You can get these at your local pharmacy or grocery store.  Baking soda. Pour a small amount into the bath as directed by your health care provider.  Colloidal oatmeal. Follow the instructions on the packaging. You can get this at your local pharmacy or grocery store.  Try applying baking soda paste to your skin. Stir water into baking soda until it reaches a paste-like consistency.   Do not scratch your skin.  Avoid hot showers or baths, which can make itching worse. A cold shower may help with itching as long as you use a moisturizer after.  Avoid scented soaps, detergents, and perfumes. Use gentle soaps, detergents, perfumes, and other cosmetic products. General Instructions  Avoid wearing tight clothes.  Keep a journal to help track what causes your itch. Write  down:  What you eat.  What cosmetic products you use.  What you drink.  What you wear. This includes jewelry.  Use a humidifier. This keeps the air moist, which helps to prevent dry skin. SEEK MEDICAL CARE IF:  The itching does not go away after several days.  You sweat at night.  You have weight loss.  You are unusually thirsty.  You urinate more than normal.  You are more tired than normal.  You have abdominal pain.  Your skin tingles.  You feel weak.  Your skin or the whites of your eyes look yellow (jaundice).  Your skin feels numb.   This information is not intended to replace advice given to you by your health care provider. Make sure you discuss any questions you have with your health care provider.   Document Released: 06/13/2011 Document Revised: 02/15/2015 Document Reviewed: 09/27/2014 Elsevier Interactive Patient Education [employer].

## 2015-09-07 NOTE — ED Notes (Signed)
Pt went to MD and was told she had tear in shoulder and was placed on diclofenac topical lotion. Pt states she broke out with rash on chin and blisters on face. Pt stopped lotion in September. Pt states she had another episode of this rash break out and blisters last week- pt has not used the lotion since September. Pt concerned why this is still happening. Pt does not have rash currently.

## 2015-09-07 NOTE — ED Notes (Signed)
Pt states she had swelling of her lips on Monday. Pt also states her feet start itching.

## 2015-10-04 ENCOUNTER — Emergency Department (HOSPITAL_COMMUNITY): Payer: Medicare Other

## 2015-10-04 ENCOUNTER — Encounter (HOSPITAL_COMMUNITY): Payer: Self-pay | Admitting: Emergency Medicine

## 2015-10-04 ENCOUNTER — Emergency Department (HOSPITAL_COMMUNITY)
Admission: EM | Admit: 2015-10-04 | Discharge: 2015-10-05 | Disposition: A | Discharge status: Home / Self Care | Payer: Medicare Other | Attending: Emergency Medicine | Admitting: Emergency Medicine

## 2015-10-04 DIAGNOSIS — I10 Essential (primary) hypertension: Secondary | ICD-10-CM | POA: Insufficient documentation

## 2015-10-04 DIAGNOSIS — Z8719 Personal history of other diseases of the digestive system: Secondary | ICD-10-CM | POA: Diagnosis not present

## 2015-10-04 DIAGNOSIS — R319 Hematuria, unspecified: Secondary | ICD-10-CM | POA: Diagnosis not present

## 2015-10-04 DIAGNOSIS — R531 Weakness: Secondary | ICD-10-CM | POA: Diagnosis present

## 2015-10-04 DIAGNOSIS — Z79899 Other long term (current) drug therapy: Secondary | ICD-10-CM | POA: Insufficient documentation

## 2015-10-04 DIAGNOSIS — Z7982 Long term (current) use of aspirin: Secondary | ICD-10-CM | POA: Insufficient documentation

## 2015-10-04 LAB — CBC WITH DIFFERENTIAL/PLATELET
BASOS PCT: 0 %
Basophils Absolute: 0 10*3/uL (ref 0.0–0.1)
Eosinophils Absolute: 0.1 10*3/uL (ref 0.0–0.7)
Eosinophils Relative: 1 %
HEMATOCRIT: 33.9 % — AB (ref 36.0–46.0)
Hemoglobin: 10.9 g/dL — ABNORMAL LOW (ref 12.0–15.0)
Lymphocytes Relative: 28 %
Lymphs Abs: 1.6 10*3/uL (ref 0.7–4.0)
MCH: 29.9 pg (ref 26.0–34.0)
MCHC: 32.2 g/dL (ref 30.0–36.0)
MCV: 93.1 fL (ref 78.0–100.0)
MONO ABS: 0.2 10*3/uL (ref 0.1–1.0)
MONOS PCT: 4 %
NEUTROS ABS: 3.8 10*3/uL (ref 1.7–7.7)
Neutrophils Relative %: 67 %
Platelets: 148 10*3/uL — ABNORMAL LOW (ref 150–400)
RBC: 3.64 MIL/uL — ABNORMAL LOW (ref 3.87–5.11)
RDW: 14.8 % (ref 11.5–15.5)
WBC: 5.7 10*3/uL (ref 4.0–10.5)

## 2015-10-04 LAB — URINALYSIS, ROUTINE W REFLEX MICROSCOPIC
Bilirubin Urine: NEGATIVE
Glucose, UA: NEGATIVE mg/dL
Ketones, ur: NEGATIVE mg/dL
Nitrite: NEGATIVE
PROTEIN: NEGATIVE mg/dL
Specific Gravity, Urine: 1.017 (ref 1.005–1.030)
pH: 5.5 (ref 5.0–8.0)

## 2015-10-04 LAB — COMPREHENSIVE METABOLIC PANEL
ALBUMIN: 3.5 g/dL (ref 3.5–5.0)
ALT: 14 U/L (ref 14–54)
AST: 25 U/L (ref 15–41)
Alkaline Phosphatase: 64 U/L (ref 38–126)
Anion gap: 11 (ref 5–15)
BILIRUBIN TOTAL: 0.8 mg/dL (ref 0.3–1.2)
BUN: 20 mg/dL (ref 6–20)
CO2: 24 mmol/L (ref 22–32)
Calcium: 9.6 mg/dL (ref 8.9–10.3)
Chloride: 109 mmol/L (ref 101–111)
Creatinine, Ser: 1.33 mg/dL — ABNORMAL HIGH (ref 0.44–1.00)
GFR calc Af Amer: 43 mL/min — ABNORMAL LOW (ref >60)
GFR, EST NON AFRICAN AMERICAN: 37 mL/min — AB (ref >60)
Glucose, Bld: 114 mg/dL — ABNORMAL HIGH (ref 65–99)
POTASSIUM: 4 mmol/L (ref 3.5–5.1)
Sodium: 144 mmol/L (ref 135–145)
TOTAL PROTEIN: 6.2 g/dL — AB (ref 6.5–8.1)

## 2015-10-04 LAB — I-STAT TROPONIN, ED: Troponin i, poc: 0.01 ng/mL (ref 0.00–0.08)

## 2015-10-04 LAB — URINE MICROSCOPIC-ADD ON: Squamous Epithelial / LPF: NONE SEEN

## 2015-10-04 LAB — CBG MONITORING, ED: Glucose-Capillary: 123 mg/dL — ABNORMAL HIGH (ref 65–99)

## 2015-10-04 MED ORDER — FOSFOMYCIN TROMETHAMINE 3 G PO PACK
3.0000 g | PACK | Freq: Once | ORAL | Status: AC
  Administered 2015-10-04: 3 g via ORAL
  Filled 2015-10-04: qty 3

## 2015-10-04 NOTE — ED Notes (Signed)
Ben, [place] at bedside at this time.

## 2015-10-04 NOTE — Discharge Instructions (Signed)
You were evaluated in the ED today for your weakness and there does not appear to be an emergent cause for her symptoms at this time. You were found to have blood and bacteria in her urine. You will be treated for a urinary tract infection with antibiotics, one time dose in the emergency department. I want you to follow-up with your doctor in 1 week for recheck and repeat urine check. Return to ED for any new or worsening symptoms.  Hematuria, Adult Hematuria is blood in your urine. It can be caused by a bladder infection, kidney infection, prostate infection, kidney stone, or cancer of your urinary tract. Infections can usually be treated with medicine, and a kidney stone usually will pass through your urine. If neither of these is the cause of your hematuria, further workup to find out the reason may be needed. It is very important that you tell your health care provider about any blood you see in your urine, even if the blood stops without treatment or happens without causing pain. Blood in your urine that happens and then stops and then happens again can be a symptom of a very serious condition. Also, pain is not a symptom in the initial stages of many urinary cancers. HOME CARE INSTRUCTIONS   Drink lots of fluid, 3-4 quarts a day. If you have been diagnosed with an infection, cranberry juice is especially recommended, in addition to large amounts of water.  Avoid caffeine, tea, and carbonated beverages because they tend to irritate the bladder.  Avoid alcohol because it may irritate the prostate.  Take all medicines as directed by your health care provider.  If you were prescribed an antibiotic medicine, finish it all even if you start to feel better.  If you have been diagnosed with a kidney stone, follow your health care provider's instructions regarding straining your urine to catch the stone.  Empty your bladder often. Avoid holding urine for long periods of time.  After a bowel  movement, women should cleanse front to back. Use each tissue only once.  Empty your bladder before and after sexual intercourse if you are a female. SEEK MEDICAL CARE IF:  You develop back pain.  You have a fever.  You have a feeling of sickness in your stomach (nausea) or vomiting.  Your symptoms are not better in 3 days. Return sooner if you are getting worse. SEEK IMMEDIATE MEDICAL CARE IF:   You develop severe vomiting and are unable to keep the medicine down.  You develop severe back or abdominal pain despite taking your medicines.  You begin passing a large amount of blood or clots in your urine.  You feel extremely weak or faint, or you pass out. MAKE SURE YOU:   Understand these instructions.  Will watch your condition.  Will get help right away if you are not doing well or get worse.   This information is not intended to replace advice given to you by your health care provider. Make sure you discuss any questions you have with your health care provider.   Document Released: 10/01/2005 Document Revised: 10/22/2014 Document Reviewed: 06/01/2013 Elsevier Interactive Patient Education [employer].

## 2015-10-04 NOTE — ED Notes (Signed)
Patient arrived via [facility]. EMS reports: patient reports feeling weak beginning at approx 1830. EMS called. Patient reported nausea. Denies vomiting, pain or shortness of breath. Patient ambulatory at home without device. BP 204/100 upon EMS arrival. Prior to arrival at ED - BP 170/90, Pulse 70's, CBG 119. Patient reports possible TIA 3-4 months ago, but undetermined.

## 2015-10-04 NOTE — ED Provider Notes (Signed)
CSN: [id number]     Arrival date & time 10/04/15  2001 History   First MD Initiated Contact with Patient 10/04/15 2004     Chief Complaint  Patient presents with  . Weakness     (Consider location/radiation/quality/duration/timing/severity/associated sxs/prior Treatment) HPI Dennice SHAMERA YARBERRY is a 80 y.o. female with history of hypertension and Crohn's disease, comes in for evaluation of generalized weakness. Patient reports this morning, while working around the house she began to feel generally weak. She reports a sensation was resolved after she had some orange juice and peanut butter crackers. She reports feeling well the remainder of the day until dinner when the same sensation returned at approximately 7 PM. She reports feeling generally weak and needed to be helped up from the dinner table. She reports she did not eat her dinner. She denies any headache, vision changes, numbness or weakness, chest pain or shortness of breath, abdominal pain, vomiting, urinary symptoms, diarrhea or constipation, fever, cough, recent illnesses. She reports husband called EMS and they recommended she come to the ED for further evaluation. She reports nausea in the truck, but that resolved upon arrival. She denies any discomfort now. She took her amlodipine and aspirin today as prescribed.  Past Medical History  Diagnosis Date  . Hypertension   . Crohn disease ([place])   . Crohn's disease [facility])    Past Surgical History  Procedure Laterality Date  . Colectomy    . Bowel resection     No family history on file. Social History  Substance Use Topics  . Smoking status: Never Smoker   . Smokeless tobacco: None  . Alcohol Use: No   OB History    No data available     Review of Systems A 10 point review of systems was completed and was negative except for pertinent positives and negatives as mentioned in the history of present illness     Allergies  Sulfa antibiotics and Codeine  Home Medications    Prior to Admission medications   Medication Sig Start Date End Date Taking? Authorizing Provider  amLODipine (NORVASC) 5 MG tablet Take 5 mg by mouth daily.   Yes Historical Provider, MD  aspirin 81 MG chewable tablet Chew 81 mg by mouth daily.   Yes Historical Provider, MD  cetirizine (ZYRTEC) 10 MG tablet Take 1 tablet (10 mg total) by mouth daily. 1 po q day prn allergies Patient taking differently: Take 10 mg by mouth daily as needed. 1 po q day prn allergies 09/07/15  Yes Tanna Furry, MD  hydroxypropyl methylcellulose (ISOPTO TEARS) 2.5 % ophthalmic solution Place 1 drop into both eyes 4 (four) times daily as needed for dry eyes.   Yes Historical Provider, MD  atorvastatin (LIPITOR) 20 MG tablet Take 1 tablet (20 mg total) by mouth daily at 6 PM. Patient not taking: Reported on 09/07/2015 02/04/14   Charolette Forward, MD  diclofenac sodium (VOLTAREN) 1 % GEL Apply 2 g topically 4 (four) times daily. Patient not taking: Reported on 09/07/2015 07/09/15   Ottie Glazier, PA-C  losartan (COZAAR) 50 MG tablet Take 1 tablet (50 mg total) by mouth daily. Patient not taking: Reported on 09/07/2015 02/04/14   Charolette Forward, MD   BP 150/83 mmHg  Pulse 63  Temp(Src) 98.9 F (37.2 C) (Oral)  Resp 17  Ht 5' 2"  (1.575 m)  Wt 45.36 kg  BMI 18.29 kg/m2  SpO2 100% Physical Exam  Constitutional: She is oriented to person, place, and time. She appears well-developed  and well-nourished.  Well-appearing African American female. No apparent distress  HENT:  Head: Normocephalic and atraumatic.  Mouth/Throat: Oropharynx is clear and moist.  Eyes: Conjunctivae are normal. Pupils are equal, round, and reactive to light. Right eye exhibits no discharge. Left eye exhibits no discharge. No scleral icterus.  Neck: Neck supple.  Cardiovascular: Normal rate, regular rhythm and normal heart sounds.   Pulmonary/Chest: Effort normal and breath sounds normal. No respiratory distress. She has no wheezes. She has no  rales.  Abdominal: Soft. There is no tenderness.  Musculoskeletal: She exhibits no tenderness.  Neurological: She is alert and oriented to person, place, and time.  Cranial Nerves II-XII grossly intact. Moves all extremities without ataxia. Motor strength and sensation are intact and equal in all 4 extremities. Completes finger to nose coordination movements without difficulty. Extraocular movements are intact without nystagmus.  Skin: Skin is warm and dry. No rash noted.  Psychiatric: She has a normal mood and affect.  Nursing note and vitals reviewed.   ED Course  Procedures (including critical care time) Labs Review Labs Reviewed  CBC WITH DIFFERENTIAL/PLATELET - Abnormal; Notable for the following:    RBC 3.64 (*)    Hemoglobin 10.9 (*)    HCT 33.9 (*)    Platelets 148 (*)    All other components within normal limits  COMPREHENSIVE METABOLIC PANEL - Abnormal; Notable for the following:    Glucose, Bld 114 (*)    Creatinine, Ser 1.33 (*)    Total Protein 6.2 (*)    GFR calc non Af Amer 37 (*)    GFR calc Af Amer 43 (*)    All other components within normal limits  URINALYSIS, ROUTINE W REFLEX MICROSCOPIC (NOT AT [facility]) - Abnormal; Notable for the following:    Hgb urine dipstick MODERATE (*)    Leukocytes, UA TRACE (*)    All other components within normal limits  URINE MICROSCOPIC-ADD ON - Abnormal; Notable for the following:    Bacteria, UA MANY (*)    Casts HYALINE CASTS (*)    All other components within normal limits  CBG MONITORING, ED - Abnormal; Notable for the following:    Glucose-Capillary 123 (*)    All other components within normal limits  URINE CULTURE  I-STAT TROPOININ, ED    Imaging Review Dg Chest 2 View  10/04/2015  CLINICAL DATA:  Weakness and nausea EXAM: CHEST - 2 VIEW COMPARISON:  02/02/2014 FINDINGS: Cardiac shadow is mildly enlarged but stable. The lungs are well aerated bilaterally. No focal infiltrate or sizable effusion is seen. No acute bony  abnormality is noted. IMPRESSION: No active disease. Electronically Signed   By: Inez Catalina M.D.   On: 10/04/2015 21:43   I have personally reviewed and evaluated these images and lab results as part of my medical decision-making.   EKG Interpretation   Date/Time:  Tuesday October 04 2015 20:14:14 EST Ventricular Rate:  69 PR Interval:  182 QRS Duration: 102 QT Interval:  405 QTC Calculation: 434 R Axis:   -37 Text Interpretation:  Sinus rhythm RSR' in V1 or V2, right VCD or RVH  Inferior infarct, old No significant change since last tracing Confirmed  by LIU MD, DANA [phone]) on 10/04/2015 8:18:06 PM     Meds given in ED:  Medications  fosfomycin (MONUROL) packet 3 g (3 g Oral Given 10/04/15 2311)    New Prescriptions   No medications on file   Filed Vitals:   10/04/15 2045 10/04/15 2115 10/04/15  2230 10/04/15 2245  BP: 151/76 142/46 166/98 150/83  Pulse: 65 67 82 63  Temp:      TempSrc:      Resp: 14 19 19 17   Height:      Weight:      SpO2: 100% 100% 100% 100%    MDM  GERLENE GLASSBURN is a 80 y.o. female who presents for evaluation of generalized weakness. Patient denies any discomfort in the ED. Her generalized weakness from earlier was resolved by eating. CBG in the ED 123. No headache, dizziness, vision changes, chest pain or shortness of breath. On arrival her vitals are stable she is afebrile and appears well. Her physical exam is unremarkable. Labs are grossly unremarkable and baseline for patient. Troponin negative. However, she does have moderate hemoglobin in her urine with too numerous to count RBCs and many bacteria. Slight elevation in creatinine likely due to mild dehydration Will treat for possible UTI with fosfomycin in the ED. Discussed we'll need to follow-up with PCP in one week for urine recheck and reevaluation. Chest x-ray is negative and EKG is unchanged. Overall, she appears well, hemodynamically stable and appropriate for discharge. Discussed plan  with patient and husband at bedside and they are amenable.  Discussed return precautions. Agreed to return for any change or worsening symptoms. Prior to patient discharge, I discussed and reviewed this case with Dr. Oleta Mouse who agrees with above plan.   Final diagnoses:  Weakness  Hematuria       Comer Locket, PA-C 10/04/15 Lithonia Liu, MD 10/05/15 [phone]

## 2015-10-06 LAB — URINE CULTURE: SPECIAL REQUESTS: NORMAL

## 2016-01-02 ENCOUNTER — Other Ambulatory Visit: Payer: Self-pay | Admitting: Cardiology

## 2016-01-02 DIAGNOSIS — R1031 Right lower quadrant pain: Secondary | ICD-10-CM

## 2016-01-02 DIAGNOSIS — K529 Noninfective gastroenteritis and colitis, unspecified: Secondary | ICD-10-CM

## 2016-01-06 ENCOUNTER — Ambulatory Visit
Admission: RE | Admit: 2016-01-06 | Discharge: 2016-01-06 | Disposition: A | Discharge status: Home / Self Care | Payer: Medicare Other | Source: Ambulatory Visit | Attending: Cardiology | Admitting: Cardiology

## 2016-01-06 DIAGNOSIS — K529 Noninfective gastroenteritis and colitis, unspecified: Secondary | ICD-10-CM

## 2016-01-06 DIAGNOSIS — R1031 Right lower quadrant pain: Secondary | ICD-10-CM

## 2016-01-06 MED ORDER — IOPAMIDOL (ISOVUE-300) INJECTION 61%
100.0000 mL | Freq: Once | INTRAVENOUS | Status: AC | PRN
  Administered 2016-01-06: 100 mL via INTRAVENOUS

## 2016-08-29 ENCOUNTER — Ambulatory Visit (INDEPENDENT_AMBULATORY_CARE_PROVIDER_SITE_OTHER): Payer: Medicare Other

## 2016-08-29 DIAGNOSIS — R002 Palpitations: Secondary | ICD-10-CM | POA: Insufficient documentation

## 2016-11-12 ENCOUNTER — Other Ambulatory Visit: Payer: Self-pay | Admitting: Critical Care Medicine

## 2016-11-12 DIAGNOSIS — N183 Chronic kidney disease, stage 3 unspecified: Secondary | ICD-10-CM

## 2016-11-13 ENCOUNTER — Other Ambulatory Visit: Payer: Medicare Other

## 2016-11-13 ENCOUNTER — Ambulatory Visit
Admission: RE | Admit: 2016-11-13 | Discharge: 2016-11-13 | Disposition: A | Discharge status: Home / Self Care | Payer: Medicare Other | Source: Ambulatory Visit | Attending: Critical Care Medicine | Admitting: Critical Care Medicine

## 2016-11-13 DIAGNOSIS — N183 Chronic kidney disease, stage 3 unspecified: Secondary | ICD-10-CM

## 2017-01-31 ENCOUNTER — Other Ambulatory Visit (HOSPITAL_COMMUNITY): Payer: Self-pay | Admitting: *Deleted

## 2017-01-31 ENCOUNTER — Encounter: Payer: Self-pay | Admitting: Nephrology

## 2017-01-31 DIAGNOSIS — N184 Chronic kidney disease, stage 4 (severe): Secondary | ICD-10-CM | POA: Insufficient documentation

## 2017-02-01 ENCOUNTER — Ambulatory Visit (HOSPITAL_COMMUNITY)
Admission: RE | Admit: 2017-02-01 | Discharge: 2017-02-01 | Disposition: A | Discharge status: Home / Self Care | Payer: Medicare Other | Source: Ambulatory Visit | Attending: Nephrology | Admitting: Nephrology

## 2017-02-01 DIAGNOSIS — N183 Chronic kidney disease, stage 3 (moderate): Secondary | ICD-10-CM | POA: Insufficient documentation

## 2017-02-01 LAB — POCT HEMOGLOBIN-HEMACUE: Hemoglobin: 9.9 g/dL — ABNORMAL LOW (ref 12.0–15.0)

## 2017-02-01 MED ORDER — EPOETIN ALFA 20000 UNIT/ML IJ SOLN
30000.0000 [IU] | Freq: Once | INTRAMUSCULAR | Status: AC
  Administered 2017-02-01: 30000 [IU] via SUBCUTANEOUS

## 2017-02-01 MED ORDER — EPOETIN ALFA 10000 UNIT/ML IJ SOLN
INTRAMUSCULAR | Status: AC
  Filled 2017-02-01: qty 1

## 2017-02-01 MED ORDER — EPOETIN ALFA 20000 UNIT/ML IJ SOLN
INTRAMUSCULAR | Status: AC
  Administered 2017-02-01: 13:00:00 30000 [IU] via SUBCUTANEOUS
  Filled 2017-02-01: qty 1

## 2017-02-01 MED ORDER — SODIUM CHLORIDE 0.9 % IV SOLN
510.0000 mg | Freq: Once | INTRAVENOUS | Status: AC
  Administered 2017-02-01: 510 mg via INTRAVENOUS
  Filled 2017-02-01: qty 17

## 2017-02-01 NOTE — Discharge Instructions (Signed)
Epoetin Alfa injection What is this medicine? EPOETIN ALFA (e POE e tin AL fa) helps your body make more red blood cells. This medicine is used to treat anemia caused by chronic kidney failure, cancer chemotherapy, or HIV-therapy. It may also be used before surgery if you have anemia. This medicine may be used for other purposes; ask your health care provider or pharmacist if you have questions. COMMON BRAND NAME(S): Epogen, Procrit What should I tell my health care provider before I take this medicine? They need to know if you have any of these conditions: -blood clotting disorders -cancer patient not on chemotherapy -cystic fibrosis -heart disease, such as angina or heart failure -hemoglobin level of 12 g/dL or greater -high blood pressure -low levels of folate, iron, or vitamin B12 -seizures -an unusual or allergic reaction to erythropoietin, albumin, benzyl alcohol, hamster proteins, other medicines, foods, dyes, or preservatives -pregnant or trying to get pregnant -breast-feeding How should I use this medicine? This medicine is for injection into a vein or under the skin. It is usually given by a health care professional in a hospital or clinic setting. If you get this medicine at home, you will be taught how to prepare and give this medicine. Use exactly as directed. Take your medicine at regular intervals. Do not take your medicine more often than directed. It is important that you put your used needles and syringes in a special sharps container. Do not put them in a trash can. If you do not have a sharps container, call your pharmacist or healthcare provider to get one. A special MedGuide will be given to you by the pharmacist with each prescription and refill. Be sure to read this information carefully each time. Talk to your pediatrician regarding the use of this medicine in children. While this drug may be prescribed for selected conditions, precautions do apply. Overdosage: If you  think you have taken too much of this medicine contact a poison control center or emergency room at once. NOTE: This medicine is only for you. Do not share this medicine with others. What if I miss a dose? If you miss a dose, take it as soon as you can. If it is almost time for your next dose, take only that dose. Do not take double or extra doses. What may interact with this medicine? Do not take this medicine with any of the following medications: -darbepoetin alfa This list may not describe all possible interactions. Give your health care provider a list of all the medicines, herbs, non-prescription drugs, or dietary supplements you use. Also tell them if you smoke, drink alcohol, or use illegal drugs. Some items may interact with your medicine. What should I watch for while using this medicine? Your condition will be monitored carefully while you are receiving this medicine. You may need blood work done while you are taking this medicine. What side effects may I notice from receiving this medicine? Side effects that you should report to your doctor or health care professional as soon as possible: -allergic reactions like skin rash, itching or hives, swelling of the face, lips, or tongue -breathing problems -changes in vision -chest pain -confusion, trouble speaking or understanding -feeling faint or lightheaded, falls -high blood pressure -muscle aches or pains -pain, swelling, warmth in the leg -rapid weight gain -severe headaches -sudden numbness or weakness of the face, arm or leg -trouble walking, dizziness, loss of balance or coordination -seizures (convulsions) -swelling of the ankles, feet, hands -unusually weak or tired  Side effects that usually do not require medical attention (report to your doctor or health care professional if they continue or are bothersome): -diarrhea -fever, chills (flu-like symptoms) -headaches -nausea, vomiting -redness, stinging, or swelling at  site where injected This list may not describe all possible side effects. Call your doctor for medical advice about side effects. You may report side effects to FDA at 1-800-FDA-1088. Where should I keep my medicine? Keep out of the reach of children. Store in a refrigerator between 2 and 8 degrees C (36 and 46 degrees F). Do not freeze or shake. Throw away any unused portion if using a single-dose vial. Multi-dose vials can be kept in the refrigerator for up to 21 days after the initial dose. Throw away unused medicine. NOTE: This sheet is a summary. It may not cover all possible information. If you have questions about this medicine, talk to your doctor, pharmacist, or health care provider.  2018 Elsevier/Gold Standard (2016-05-21 19:42:31)  Ferumoxytol injection What is this medicine? FERUMOXYTOL is an iron complex. Iron is used to make healthy red blood cells, which carry oxygen and nutrients throughout the body. This medicine is used to treat iron deficiency anemia in people with chronic kidney disease. This medicine may be used for other purposes; ask your health care provider or pharmacist if you have questions. COMMON BRAND NAME(S): Feraheme What should I tell my health care provider before I take this medicine? They need to know if you have any of these conditions: -anemia not caused by low iron levels -high levels of iron in the blood -magnetic resonance imaging (MRI) test scheduled -an unusual or allergic reaction to iron, other medicines, foods, dyes, or preservatives -pregnant or trying to get pregnant -breast-feeding How should I use this medicine? This medicine is for injection into a vein. It is given by a health care professional in a hospital or clinic setting. Talk to your pediatrician regarding the use of this medicine in children. Special care may be needed. Overdosage: If you think you have taken too much of this medicine contact a poison control center or emergency  room at once. NOTE: This medicine is only for you. Do not share this medicine with others. What if I miss a dose? It is important not to miss your dose. Call your doctor or health care professional if you are unable to keep an appointment. What may interact with this medicine? This medicine may interact with the following medications: -other iron products This list may not describe all possible interactions. Give your health care provider a list of all the medicines, herbs, non-prescription drugs, or dietary supplements you use. Also tell them if you smoke, drink alcohol, or use illegal drugs. Some items may interact with your medicine. What should I watch for while using this medicine? Visit your doctor or healthcare professional regularly. Tell your doctor or healthcare professional if your symptoms do not start to get better or if they get worse. You may need blood work done while you are taking this medicine. You may need to follow a special diet. Talk to your doctor. Foods that contain iron include: whole grains/cereals, dried fruits, beans, or peas, leafy green vegetables, and organ meats (liver, kidney). What side effects may I notice from receiving this medicine? Side effects that you should report to your doctor or health care professional as soon as possible: -allergic reactions like skin rash, itching or hives, swelling of the face, lips, or tongue -breathing problems -changes in blood pressure -feeling  faint or lightheaded, falls -fever or chills -flushing, sweating, or hot feelings -swelling of the ankles or feet Side effects that usually do not require medical attention (report to your doctor or health care professional if they continue or are bothersome): -diarrhea -headache -nausea, vomiting -stomach pain This list may not describe all possible side effects. Call your doctor for medical advice about side effects. You may report side effects to FDA at 1-800-FDA-1088. Where  should I keep my medicine? This drug is given in a hospital or clinic and will not be stored at home. NOTE: This sheet is a summary. It may not cover all possible information. If you have questions about this medicine, talk to your doctor, pharmacist, or health care provider.  2018 Elsevier/Gold Standard (2015-11-03 12:41:49)

## 2017-02-05 MED FILL — Epoetin Alfa Inj 20000 Unit/ML: INTRAMUSCULAR | Qty: 1 | Status: AC

## 2017-02-05 MED FILL — Epoetin Alfa Inj 10000 Unit/ML: INTRAMUSCULAR | Qty: 1 | Status: AC

## 2017-02-14 ENCOUNTER — Other Ambulatory Visit (HOSPITAL_COMMUNITY): Payer: Self-pay | Admitting: *Deleted

## 2017-02-15 ENCOUNTER — Ambulatory Visit (HOSPITAL_COMMUNITY)
Admission: RE | Admit: 2017-02-15 | Discharge: 2017-02-15 | Disposition: A | Discharge status: Home / Self Care | Payer: Medicare Other | Source: Ambulatory Visit | Attending: Nephrology | Admitting: Nephrology

## 2017-02-15 DIAGNOSIS — N183 Chronic kidney disease, stage 3 (moderate): Secondary | ICD-10-CM | POA: Diagnosis present

## 2017-02-15 LAB — POCT HEMOGLOBIN-HEMACUE: HEMOGLOBIN: 11.7 g/dL — AB (ref 12.0–15.0)

## 2017-02-15 MED ORDER — EPOETIN ALFA 10000 UNIT/ML IJ SOLN
INTRAMUSCULAR | Status: AC
  Administered 2017-02-15: 13:00:00 10000 [IU] via SUBCUTANEOUS
  Filled 2017-02-15: qty 1

## 2017-02-15 MED ORDER — EPOETIN ALFA 20000 UNIT/ML IJ SOLN
INTRAMUSCULAR | Status: AC
  Administered 2017-02-15: 20000 [IU] via SUBCUTANEOUS
  Filled 2017-02-15: qty 1

## 2017-02-15 MED ORDER — EPOETIN ALFA 20000 UNIT/ML IJ SOLN: 30000.0000 [IU] | INTRAMUSCULAR | Status: DC

## 2017-02-22 ENCOUNTER — Other Ambulatory Visit: Payer: Self-pay | Admitting: Gastroenterology

## 2017-02-22 DIAGNOSIS — R109 Unspecified abdominal pain: Secondary | ICD-10-CM

## 2017-02-26 ENCOUNTER — Other Ambulatory Visit: Payer: Medicare Other

## 2017-03-01 ENCOUNTER — Encounter (HOSPITAL_COMMUNITY): Payer: Medicare Other

## 2017-03-29 ENCOUNTER — Encounter (HOSPITAL_COMMUNITY)
Admission: RE | Admit: 2017-03-29 | Discharge: 2017-03-29 | Disposition: A | Discharge status: Home / Self Care | Payer: Medicare Other | Source: Ambulatory Visit | Attending: Nephrology | Admitting: Nephrology

## 2017-03-29 DIAGNOSIS — N184 Chronic kidney disease, stage 4 (severe): Secondary | ICD-10-CM | POA: Diagnosis not present

## 2017-03-29 LAB — IRON AND TIBC
Iron: 87 ug/dL (ref 28–170)
Saturation Ratios: 21 % (ref 10.4–31.8)
TIBC: 419 ug/dL (ref 250–450)
UIBC: 332 ug/dL

## 2017-03-29 LAB — POCT HEMOGLOBIN-HEMACUE: Hemoglobin: 11.8 g/dL — ABNORMAL LOW (ref 12.0–15.0)

## 2017-03-29 LAB — FERRITIN: Ferritin: 231 ng/mL (ref 11–307)

## 2017-03-29 MED ORDER — EPOETIN ALFA 20000 UNIT/ML IJ SOLN
INTRAMUSCULAR | Status: AC
  Administered 2017-03-29: 20000 [IU]
  Filled 2017-03-29: qty 1

## 2017-03-29 MED ORDER — EPOETIN ALFA 10000 UNIT/ML IJ SOLN
INTRAMUSCULAR | Status: AC
  Administered 2017-03-29: 10:00:00 10000 [IU]
  Filled 2017-03-29: qty 1

## 2017-03-29 MED ORDER — EPOETIN ALFA 40000 UNIT/ML IJ SOLN: 30000.0000 [IU] | INTRAMUSCULAR | Status: DC

## 2017-04-02 ENCOUNTER — Encounter (HOSPITAL_COMMUNITY): Payer: Medicare Other

## 2017-04-11 ENCOUNTER — Other Ambulatory Visit (HOSPITAL_COMMUNITY): Payer: Self-pay

## 2017-04-12 ENCOUNTER — Ambulatory Visit (HOSPITAL_COMMUNITY)
Admission: RE | Admit: 2017-04-12 | Discharge: 2017-04-12 | Disposition: A | Discharge status: Home / Self Care | Payer: Medicare Other | Source: Ambulatory Visit | Attending: Nephrology | Admitting: Nephrology

## 2017-04-12 DIAGNOSIS — N184 Chronic kidney disease, stage 4 (severe): Secondary | ICD-10-CM | POA: Diagnosis present

## 2017-04-12 DIAGNOSIS — D631 Anemia in chronic kidney disease: Secondary | ICD-10-CM | POA: Insufficient documentation

## 2017-04-12 LAB — POCT HEMOGLOBIN-HEMACUE: Hemoglobin: 13.9 g/dL (ref 12.0–15.0)

## 2017-04-12 MED ORDER — EPOETIN ALFA 40000 UNIT/ML IJ SOLN: 30000.0000 [IU] | INTRAMUSCULAR | Status: DC

## 2017-04-12 MED ORDER — SODIUM CHLORIDE 0.9 % IV SOLN
510.0000 mg | Freq: Once | INTRAVENOUS | Status: AC
  Administered 2017-04-12: 510 mg via INTRAVENOUS
  Filled 2017-04-12: qty 17

## 2017-04-25 ENCOUNTER — Other Ambulatory Visit (HOSPITAL_COMMUNITY): Payer: Self-pay | Admitting: *Deleted

## 2017-04-26 ENCOUNTER — Encounter (HOSPITAL_COMMUNITY)
Admission: RE | Admit: 2017-04-26 | Discharge: 2017-04-26 | Disposition: A | Discharge status: Home / Self Care | Payer: Medicare Other | Source: Ambulatory Visit | Attending: Nephrology | Admitting: Nephrology

## 2017-04-26 DIAGNOSIS — N184 Chronic kidney disease, stage 4 (severe): Secondary | ICD-10-CM | POA: Insufficient documentation

## 2017-04-26 LAB — FERRITIN: FERRITIN: 667 ng/mL — AB (ref 11–307)

## 2017-04-26 LAB — IRON AND TIBC
Iron: 142 ug/dL (ref 28–170)
SATURATION RATIOS: 36 % — AB (ref 10.4–31.8)
TIBC: 391 ug/dL (ref 250–450)
UIBC: 249 ug/dL

## 2017-04-26 LAB — POCT HEMOGLOBIN-HEMACUE: Hemoglobin: 12.8 g/dL (ref 12.0–15.0)

## 2017-04-26 MED ORDER — EPOETIN ALFA 40000 UNIT/ML IJ SOLN: 30000.0000 [IU] | INTRAMUSCULAR | Status: DC

## 2017-05-10 ENCOUNTER — Encounter (HOSPITAL_COMMUNITY)
Admission: RE | Admit: 2017-05-10 | Discharge: 2017-05-10 | Disposition: A | Discharge status: Home / Self Care | Payer: Medicare Other | Source: Ambulatory Visit | Attending: Nephrology | Admitting: Nephrology

## 2017-05-10 DIAGNOSIS — N184 Chronic kidney disease, stage 4 (severe): Secondary | ICD-10-CM | POA: Diagnosis not present

## 2017-05-10 LAB — POCT HEMOGLOBIN-HEMACUE: Hemoglobin: 12.2 g/dL (ref 12.0–15.0)

## 2017-05-10 MED ORDER — EPOETIN ALFA 40000 UNIT/ML IJ SOLN: 30000.0000 [IU] | INTRAMUSCULAR | Status: DC

## 2017-05-15 ENCOUNTER — Ambulatory Visit
Admission: RE | Admit: 2017-05-15 | Discharge: 2017-05-15 | Disposition: A | Discharge status: Home / Self Care | Payer: Medicare Other | Source: Ambulatory Visit | Attending: Gastroenterology | Admitting: Gastroenterology

## 2017-05-15 DIAGNOSIS — R109 Unspecified abdominal pain: Secondary | ICD-10-CM

## 2017-05-18 ENCOUNTER — Emergency Department (HOSPITAL_COMMUNITY): Payer: Medicare Other

## 2017-05-18 ENCOUNTER — Emergency Department (HOSPITAL_COMMUNITY)
Admission: EM | Admit: 2017-05-18 | Discharge: 2017-05-18 | Disposition: A | Discharge status: Home / Self Care | Payer: Medicare Other | Attending: Emergency Medicine | Admitting: Emergency Medicine

## 2017-05-18 ENCOUNTER — Encounter (HOSPITAL_COMMUNITY): Payer: Self-pay | Admitting: Emergency Medicine

## 2017-05-18 DIAGNOSIS — N184 Chronic kidney disease, stage 4 (severe): Secondary | ICD-10-CM | POA: Diagnosis not present

## 2017-05-18 DIAGNOSIS — Z7982 Long term (current) use of aspirin: Secondary | ICD-10-CM | POA: Insufficient documentation

## 2017-05-18 DIAGNOSIS — Z79899 Other long term (current) drug therapy: Secondary | ICD-10-CM | POA: Diagnosis not present

## 2017-05-18 DIAGNOSIS — R109 Unspecified abdominal pain: Secondary | ICD-10-CM | POA: Diagnosis present

## 2017-05-18 DIAGNOSIS — R1031 Right lower quadrant pain: Secondary | ICD-10-CM

## 2017-05-18 DIAGNOSIS — I129 Hypertensive chronic kidney disease with stage 1 through stage 4 chronic kidney disease, or unspecified chronic kidney disease: Secondary | ICD-10-CM | POA: Insufficient documentation

## 2017-05-18 LAB — COMPREHENSIVE METABOLIC PANEL
ALK PHOS: 46 U/L (ref 38–126)
ALT: 16 U/L (ref 14–54)
AST: 24 U/L (ref 15–41)
Albumin: 3.7 g/dL (ref 3.5–5.0)
Anion gap: 8 (ref 5–15)
BUN: 26 mg/dL — AB (ref 6–20)
CHLORIDE: 111 mmol/L (ref 101–111)
CO2: 20 mmol/L — ABNORMAL LOW (ref 22–32)
CREATININE: 1.34 mg/dL — AB (ref 0.44–1.00)
Calcium: 9.2 mg/dL (ref 8.9–10.3)
GFR calc Af Amer: 42 mL/min — ABNORMAL LOW (ref >60)
GFR, EST NON AFRICAN AMERICAN: 37 mL/min — AB (ref >60)
Glucose, Bld: 97 mg/dL (ref 65–99)
Potassium: 3.6 mmol/L (ref 3.5–5.1)
Sodium: 139 mmol/L (ref 135–145)
Total Bilirubin: 0.8 mg/dL (ref 0.3–1.2)
Total Protein: 6.6 g/dL (ref 6.5–8.1)

## 2017-05-18 LAB — URINALYSIS, ROUTINE W REFLEX MICROSCOPIC
BILIRUBIN URINE: NEGATIVE
Glucose, UA: NEGATIVE mg/dL
HGB URINE DIPSTICK: NEGATIVE
KETONES UR: NEGATIVE mg/dL
Leukocytes, UA: NEGATIVE
Nitrite: NEGATIVE
PROTEIN: NEGATIVE mg/dL
Specific Gravity, Urine: 1.026 (ref 1.005–1.030)
pH: 5 (ref 5.0–8.0)

## 2017-05-18 LAB — CBC WITH DIFFERENTIAL/PLATELET
Basophils Absolute: 0 10*3/uL (ref 0.0–0.1)
Basophils Relative: 0 %
EOS ABS: 0.1 10*3/uL (ref 0.0–0.7)
EOS PCT: 1 %
HCT: 35.7 % — ABNORMAL LOW (ref 36.0–46.0)
Hemoglobin: 11.9 g/dL — ABNORMAL LOW (ref 12.0–15.0)
LYMPHS ABS: 1.4 10*3/uL (ref 0.7–4.0)
Lymphocytes Relative: 25 %
MCH: 30.4 pg (ref 26.0–34.0)
MCHC: 33.3 g/dL (ref 30.0–36.0)
MCV: 91.3 fL (ref 78.0–100.0)
Monocytes Absolute: 0.2 10*3/uL (ref 0.1–1.0)
Monocytes Relative: 4 %
Neutro Abs: 3.7 10*3/uL (ref 1.7–7.7)
Neutrophils Relative %: 70 %
PLATELETS: 98 10*3/uL — AB (ref 150–400)
RBC: 3.91 MIL/uL (ref 3.87–5.11)
RDW: 16.1 % — ABNORMAL HIGH (ref 11.5–15.5)
WBC: 5.3 10*3/uL (ref 4.0–10.5)

## 2017-05-18 MED ORDER — ONDANSETRON HCL 4 MG/2ML IJ SOLN
4.0000 mg | Freq: Once | INTRAMUSCULAR | Status: AC
  Administered 2017-05-18: 4 mg via INTRAVENOUS
  Filled 2017-05-18: qty 2

## 2017-05-18 MED ORDER — SODIUM CHLORIDE 0.9 % IV BOLUS (SEPSIS)
500.0000 mL | Freq: Once | INTRAVENOUS | Status: AC
Start: 2017-05-18 — End: 2017-05-18
  Administered 2017-05-18: 500 mL via INTRAVENOUS

## 2017-05-18 MED ORDER — ONDANSETRON 8 MG PO TBDP: ORAL_TABLET | ORAL | 0 refills | Status: DC

## 2017-05-18 MED ORDER — IOPAMIDOL (ISOVUE-300) INJECTION 61%
INTRAVENOUS | Status: AC
  Administered 2017-05-18: 30 mL via ORAL
  Filled 2017-05-18: qty 75

## 2017-05-18 MED ORDER — MORPHINE SULFATE (PF) 4 MG/ML IV SOLN
4.0000 mg | Freq: Once | INTRAVENOUS | Status: AC
  Administered 2017-05-18: 4 mg via INTRAVENOUS
  Filled 2017-05-18: qty 1

## 2017-05-18 MED ORDER — IOPAMIDOL (ISOVUE-300) INJECTION 61%
INTRAVENOUS | Status: AC
  Administered 2017-05-18: 75 mL via INTRAVENOUS
  Filled 2017-05-18: qty 30

## 2017-05-18 MED ORDER — HYDROCODONE-ACETAMINOPHEN 5-325 MG PO TABS: 1.0000 | ORAL_TABLET | Freq: Four times a day (QID) | ORAL | 0 refills | Status: DC | PRN

## 2017-05-18 NOTE — ED Triage Notes (Addendum)
Pt reports RLQ distention since she woke up this am. Pt sts that her pain is worse with palpation. Pt reports normal BM yesterday with normal urination. Pt eating and drinking w/o difficulty. Pt is A&O and in NAD. Dr Stark Jock at bedside. Pt reports hx of colon resection.

## 2017-05-18 NOTE — Discharge Instructions (Signed)
Hydrocodone as prescribed as needed for pain. Zofran as prescribed as needed for nausea.  Return to the emergency department if you develop worsening pain, high fevers, bloody stools, or other new and concerning symptoms.

## 2017-05-18 NOTE — ED Triage Notes (Signed)
Pt from home via EMS c/o new onset abd distention. Pt c/o of tenderness upon palpation. Pt is A&O and in NAD. Pt was noted to ambulate w/o difficulty from EMS stretcher to ED gurney.

## 2017-05-18 NOTE — ED Provider Notes (Signed)
[facility] DEPT Provider Note   CSN: [id number] Arrival date & time: 05/18/17  1231     History   Chief Complaint Chief Complaint  Patient presents with  . Abdominal Pain    HPI [name] is a 82 y.o. female.  Patient is a 82 year old female with past medical history of partial colectomy and hypertension. She presents today for evaluation of right sided abdominal pain. This began this morning shortly after eating. She denies any nausea or vomiting. She denies any constipation or diarrhea. She does state that she feels somewhat bloated. She denies any urinary complaints.   The history is provided by the patient.  Abdominal Pain   This is a new problem. The current episode started 3 to 5 hours ago. The problem occurs constantly. The problem has been gradually worsening. The pain is associated with eating. The pain is located in the RUQ and RLQ. The quality of the pain is cramping. The pain is moderate. Pertinent negatives include fever, melena, constipation and dysuria. The symptoms are aggravated by certain positions (Palpation). Nothing relieves the symptoms.    Past Medical History:  Diagnosis Date  . Crohn disease ([place])   . Crohn's disease ([place])   . Hypertension     Patient Active Problem List   Diagnosis Date Noted  . CKD (chronic kidney disease) stage 4, GFR 15-29 ml/min (HCC) 01/31/2017  . Palpitations 08/29/2016  . TIA (transient ischemic attack) 02/02/2014    Past Surgical History:  Procedure Laterality Date  . BOWEL RESECTION    . COLECTOMY      OB History    No data available       Home Medications    Prior to Admission medications   Medication Sig Start Date End Date Taking? Authorizing Provider  amLODipine (NORVASC) 5 MG tablet Take 5 mg by mouth daily.    [provider]  aspirin 81 MG chewable tablet Chew 81 mg by mouth daily.    [provider]  atorvastatin (LIPITOR) 20 MG tablet Take 1 tablet (20 mg total) by mouth  daily at 6 PM. Patient not taking: Reported on 09/07/2015 02/04/14   Charolette Forward, MD  cetirizine (ZYRTEC) 10 MG tablet Take 1 tablet (10 mg total) by mouth daily. 1 po q day prn allergies Patient taking differently: Take 10 mg by mouth daily as needed. 1 po q day prn allergies 09/07/15   Tanna Furry, MD  diclofenac sodium (VOLTAREN) 1 % GEL Apply 2 g topically 4 (four) times daily. Patient not taking: Reported on 09/07/2015 07/09/15   Patel-Mills, Orvil Feil, PA-C  hydroxypropyl methylcellulose (ISOPTO TEARS) 2.5 % ophthalmic solution Place 1 drop into both eyes 4 (four) times daily as needed for dry eyes.    [provider]  losartan (COZAAR) 50 MG tablet Take 1 tablet (50 mg total) by mouth daily. Patient not taking: Reported on 09/07/2015 02/04/14   Charolette Forward, MD    Family History No family history on file.  Social History Social History  Substance Use Topics  . Smoking status: Never Smoker  . Smokeless tobacco: Never Used  . Alcohol use No     Allergies   Sulfa antibiotics and Codeine   Review of Systems Review of Systems  Constitutional: Negative for fever.  Gastrointestinal: Positive for abdominal pain. Negative for constipation and melena.  Genitourinary: Negative for dysuria.  All other systems reviewed and are negative.    Physical Exam Updated Vital Signs SpO2 100% Comment: RA  Physical Exam  Constitutional: She is oriented to person, place, and time. She appears well-developed and well-nourished. No distress.  HENT:  Head: Normocephalic and atraumatic.  Neck: Normal range of motion. Neck supple.  Cardiovascular: Normal rate and regular rhythm.  Exam reveals no gallop and no friction rub.   No murmur heard. Pulmonary/Chest: Effort normal and breath sounds normal. No respiratory distress. She has no wheezes.  Abdominal: Soft. Bowel sounds are normal. She exhibits no distension. There is tenderness.  There is tenderness to palpation in the right lower  quadrant and right midabdomen. There is no rebound or guarding.  Musculoskeletal: Normal range of motion.  Neurological: She is alert and oriented to person, place, and time.  Skin: Skin is warm and dry. She is not diaphoretic.  Nursing note and vitals reviewed.    ED Treatments / Results  Labs (all labs ordered are listed, but only abnormal results are displayed) Labs Reviewed  COMPREHENSIVE METABOLIC PANEL  CBC WITH DIFFERENTIAL/PLATELET  URINALYSIS, ROUTINE W REFLEX MICROSCOPIC    EKG  EKG Interpretation None       Radiology No results found.  Procedures Procedures (including critical care time)  Medications Ordered in ED Medications  sodium chloride 0.9 % bolus 500 mL (not administered)  ondansetron (ZOFRAN) injection 4 mg (not administered)  morphine 4 MG/ML injection 4 mg (not administered)     Initial Impression / Assessment and Plan / ED Course  I have reviewed the triage vital signs and the nursing notes.  Pertinent labs & imaging results that were available during my care of the patient were reviewed by me and considered in my medical decision making (see chart for details).  Patient presents here with complaints of right-sided abdominal pain that started after eating this morning. She is tender to the right abdomen, however no rebound or guarding. Her workup reveals no elevation of white count, no fever, and CT scan reveals no significant intra-abdominal pathology. She vomited the contrast and now appears to be feeling better. She will be discharged with medicine for pain and nausea. The return as needed if she experiences additional problems.  Final Clinical Impressions(s) / ED Diagnoses   Final diagnoses:  None    New Prescriptions New Prescriptions   No medications on file     Veryl Speak, MD 05/18/17 1609

## 2017-05-18 NOTE — ED Notes (Signed)
Pt denied having to urinate.

## 2017-05-24 ENCOUNTER — Ambulatory Visit (HOSPITAL_COMMUNITY)
Admission: RE | Admit: 2017-05-24 | Discharge: 2017-05-24 | Disposition: A | Discharge status: Home / Self Care | Payer: Medicare Other | Source: Ambulatory Visit | Attending: Nephrology | Admitting: Nephrology

## 2017-05-24 DIAGNOSIS — D631 Anemia in chronic kidney disease: Secondary | ICD-10-CM | POA: Diagnosis present

## 2017-05-24 DIAGNOSIS — N183 Chronic kidney disease, stage 3 (moderate): Secondary | ICD-10-CM | POA: Insufficient documentation

## 2017-05-24 DIAGNOSIS — N184 Chronic kidney disease, stage 4 (severe): Secondary | ICD-10-CM

## 2017-05-24 LAB — FERRITIN: Ferritin: 496 ng/mL — ABNORMAL HIGH (ref 11–307)

## 2017-05-24 LAB — IRON AND TIBC
Iron: 151 ug/dL (ref 28–170)
Saturation Ratios: 41 % — ABNORMAL HIGH (ref 10.4–31.8)
TIBC: 367 ug/dL (ref 250–450)
UIBC: 216 ug/dL

## 2017-05-24 LAB — POCT HEMOGLOBIN-HEMACUE: HEMOGLOBIN: 11.6 g/dL — AB (ref 12.0–15.0)

## 2017-05-24 MED ORDER — EPOETIN ALFA 10000 UNIT/ML IJ SOLN
INTRAMUSCULAR | Status: AC
  Administered 2017-05-24: 10000 [IU]
  Filled 2017-05-24: qty 1

## 2017-05-24 MED ORDER — EPOETIN ALFA 40000 UNIT/ML IJ SOLN: 30000.0000 [IU] | INTRAMUSCULAR | Status: DC

## 2017-05-24 MED ORDER — EPOETIN ALFA 20000 UNIT/ML IJ SOLN
INTRAMUSCULAR | Status: AC
  Administered 2017-05-24: 20000 [IU]
  Filled 2017-05-24: qty 1

## 2017-06-07 ENCOUNTER — Encounter (HOSPITAL_COMMUNITY)
Admission: RE | Admit: 2017-06-07 | Discharge: 2017-06-07 | Disposition: A | Discharge status: Home / Self Care | Payer: Medicare Other | Source: Ambulatory Visit | Attending: Nephrology | Admitting: Nephrology

## 2017-06-07 DIAGNOSIS — N184 Chronic kidney disease, stage 4 (severe): Secondary | ICD-10-CM | POA: Diagnosis not present

## 2017-06-07 LAB — POCT HEMOGLOBIN-HEMACUE: Hemoglobin: 12.1 g/dL (ref 12.0–15.0)

## 2017-06-07 MED ORDER — EPOETIN ALFA 40000 UNIT/ML IJ SOLN: 30000.0000 [IU] | INTRAMUSCULAR | Status: DC

## 2017-06-21 ENCOUNTER — Encounter (HOSPITAL_COMMUNITY)
Admission: RE | Admit: 2017-06-21 | Discharge: 2017-06-21 | Disposition: A | Discharge status: Home / Self Care | Payer: Medicare Other | Source: Ambulatory Visit | Attending: Nephrology | Admitting: Nephrology

## 2017-06-21 DIAGNOSIS — N184 Chronic kidney disease, stage 4 (severe): Secondary | ICD-10-CM | POA: Diagnosis not present

## 2017-06-21 LAB — POCT HEMOGLOBIN-HEMACUE: Hemoglobin: 11.2 g/dL — ABNORMAL LOW (ref 12.0–15.0)

## 2017-06-21 LAB — IRON AND TIBC
Iron: 98 ug/dL (ref 28–170)
Saturation Ratios: 28 % (ref 10.4–31.8)
TIBC: 344 ug/dL (ref 250–450)
UIBC: 246 ug/dL

## 2017-06-21 LAB — FERRITIN: Ferritin: 430 ng/mL — ABNORMAL HIGH (ref 11–307)

## 2017-06-21 MED ORDER — EPOETIN ALFA 40000 UNIT/ML IJ SOLN: 30000.0000 [IU] | INTRAMUSCULAR | Status: DC

## 2017-06-21 MED ORDER — EPOETIN ALFA 10000 UNIT/ML IJ SOLN
INTRAMUSCULAR | Status: AC
  Administered 2017-06-21: 10000 [IU] via SUBCUTANEOUS
  Filled 2017-06-21: qty 1

## 2017-06-21 MED ORDER — EPOETIN ALFA 20000 UNIT/ML IJ SOLN
INTRAMUSCULAR | Status: AC
  Administered 2017-06-21: 20000 [IU] via SUBCUTANEOUS
  Filled 2017-06-21: qty 1

## 2017-06-25 ENCOUNTER — Encounter (HOSPITAL_COMMUNITY): Payer: Self-pay

## 2017-06-25 DIAGNOSIS — R4182 Altered mental status, unspecified: Secondary | ICD-10-CM | POA: Insufficient documentation

## 2017-06-25 DIAGNOSIS — F22 Delusional disorders: Secondary | ICD-10-CM | POA: Insufficient documentation

## 2017-06-25 DIAGNOSIS — R441 Visual hallucinations: Secondary | ICD-10-CM | POA: Diagnosis present

## 2017-06-25 NOTE — ED Triage Notes (Signed)
Pt reports that her current medications are making her more easily confused and "unable to make lucid decisions." Pt presents with family. She is calm and A&Ox4. No distress. She takes Calcitrol and Hyoscyamine and believes that one of them is causing her symptoms. She states that she has been on these medications over a year.

## 2017-06-26 ENCOUNTER — Emergency Department (HOSPITAL_COMMUNITY): Payer: Medicare Other

## 2017-06-26 ENCOUNTER — Emergency Department (HOSPITAL_COMMUNITY)
Admission: EM | Admit: 2017-06-26 | Discharge: 2017-06-26 | Disposition: A | Discharge status: Home / Self Care | Payer: Medicare Other | Attending: Emergency Medicine | Admitting: Emergency Medicine

## 2017-06-26 DIAGNOSIS — F22 Delusional disorders: Secondary | ICD-10-CM | POA: Diagnosis not present

## 2017-06-26 DIAGNOSIS — Z0000 Encounter for general adult medical examination without abnormal findings: Secondary | ICD-10-CM

## 2017-06-26 DIAGNOSIS — R443 Hallucinations, unspecified: Secondary | ICD-10-CM

## 2017-06-26 LAB — BASIC METABOLIC PANEL
Anion gap: 7 (ref 5–15)
BUN: 25 mg/dL — AB (ref 6–20)
CO2: 28 mmol/L (ref 22–32)
CREATININE: 1.18 mg/dL — AB (ref 0.44–1.00)
Calcium: 9.7 mg/dL (ref 8.9–10.3)
Chloride: 108 mmol/L (ref 101–111)
GFR calc Af Amer: 49 mL/min — ABNORMAL LOW (ref >60)
GFR, EST NON AFRICAN AMERICAN: 43 mL/min — AB (ref >60)
Glucose, Bld: 98 mg/dL (ref 65–99)
Potassium: 4.1 mmol/L (ref 3.5–5.1)
SODIUM: 143 mmol/L (ref 135–145)

## 2017-06-26 LAB — URINALYSIS, ROUTINE W REFLEX MICROSCOPIC
Bacteria, UA: NONE SEEN
Bilirubin Urine: NEGATIVE
GLUCOSE, UA: NEGATIVE mg/dL
Hgb urine dipstick: NEGATIVE
Ketones, ur: NEGATIVE mg/dL
Nitrite: NEGATIVE
PH: 5 (ref 5.0–8.0)
PROTEIN: NEGATIVE mg/dL
SQUAMOUS EPITHELIAL / LPF: NONE SEEN
Specific Gravity, Urine: 1.018 (ref 1.005–1.030)

## 2017-06-26 LAB — HEPATIC FUNCTION PANEL
ALBUMIN: 3.7 g/dL (ref 3.5–5.0)
ALT: 15 U/L (ref 14–54)
AST: 27 U/L (ref 15–41)
Alkaline Phosphatase: 57 U/L (ref 38–126)
BILIRUBIN TOTAL: 0.6 mg/dL (ref 0.3–1.2)
Bilirubin, Direct: 0.1 mg/dL (ref 0.1–0.5)
Indirect Bilirubin: 0.5 mg/dL (ref 0.3–0.9)
Total Protein: 6.6 g/dL (ref 6.5–8.1)

## 2017-06-26 LAB — CBC WITH DIFFERENTIAL/PLATELET
Basophils Absolute: 0 10*3/uL (ref 0.0–0.1)
Basophils Relative: 0 %
EOS ABS: 0.1 10*3/uL (ref 0.0–0.7)
EOS PCT: 2 %
HCT: 35.2 % — ABNORMAL LOW (ref 36.0–46.0)
Hemoglobin: 11.5 g/dL — ABNORMAL LOW (ref 12.0–15.0)
LYMPHS ABS: 1.7 10*3/uL (ref 0.7–4.0)
Lymphocytes Relative: 24 %
MCH: 31.7 pg (ref 26.0–34.0)
MCHC: 32.7 g/dL (ref 30.0–36.0)
MCV: 97 fL (ref 78.0–100.0)
MONO ABS: 0.5 10*3/uL (ref 0.1–1.0)
MONOS PCT: 8 %
Neutro Abs: 4.7 10*3/uL (ref 1.7–7.7)
Neutrophils Relative %: 66 %
PLATELETS: 174 10*3/uL (ref 150–400)
RBC: 3.63 MIL/uL — ABNORMAL LOW (ref 3.87–5.11)
RDW: 18 % — AB (ref 11.5–15.5)
WBC: 7.1 10*3/uL (ref 4.0–10.5)

## 2017-06-26 LAB — RAPID URINE DRUG SCREEN, HOSP PERFORMED
AMPHETAMINES: NOT DETECTED
Barbiturates: NOT DETECTED
Benzodiazepines: NOT DETECTED
Cocaine: NOT DETECTED
Opiates: NOT DETECTED
TETRAHYDROCANNABINOL: NOT DETECTED

## 2017-06-26 LAB — VITAMIN B12: VITAMIN B 12: 272 pg/mL (ref 180–914)

## 2017-06-26 LAB — ETHANOL

## 2017-06-26 MED ORDER — TRAZODONE HCL 50 MG PO TABS: 50.0000 mg | ORAL_TABLET | Freq: Every evening | ORAL | Status: DC | PRN
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
## 2017-06-26 NOTE — ED Notes (Signed)
Patient wished to be discharged and find psych help outside. Family was in agreeance and agreed to help. ER MD discharge pt
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
## 2017-06-27 LAB — URINE CULTURE: CULTURE: NO GROWTH

## 2017-07-04 ENCOUNTER — Other Ambulatory Visit (HOSPITAL_COMMUNITY): Payer: Self-pay | Admitting: *Deleted

## 2017-07-05 ENCOUNTER — Inpatient Hospital Stay (HOSPITAL_COMMUNITY): Admission: RE | Admit: 2017-07-05 | Payer: Medicare Other | Source: Ambulatory Visit

## 2017-07-09 ENCOUNTER — Encounter (HOSPITAL_COMMUNITY)
Admission: RE | Admit: 2017-07-09 | Discharge: 2017-07-09 | Disposition: A | Discharge status: Home / Self Care | Payer: Medicare Other | Source: Ambulatory Visit | Attending: Nephrology | Admitting: Nephrology

## 2017-07-09 DIAGNOSIS — N184 Chronic kidney disease, stage 4 (severe): Secondary | ICD-10-CM

## 2017-07-09 LAB — POCT HEMOGLOBIN-HEMACUE: Hemoglobin: 12.2 g/dL (ref 12.0–15.0)

## 2017-07-09 MED ORDER — EPOETIN ALFA 40000 UNIT/ML IJ SOLN
30000.0000 [IU] | INTRAMUSCULAR | Status: DC
Start: 2017-07-09 — End: 2017-07-10

## 2017-07-09 MED ORDER — SODIUM CHLORIDE 0.9 % IV SOLN
510.0000 mg | Freq: Once | INTRAVENOUS | Status: AC
  Administered 2017-07-09: 10:00:00 510 mg via INTRAVENOUS
  Filled 2017-07-09: qty 17

## 2017-07-23 ENCOUNTER — Encounter (HOSPITAL_COMMUNITY)
Admission: RE | Admit: 2017-07-23 | Discharge: 2017-07-23 | Disposition: A | Discharge status: Home / Self Care | Payer: Medicare Other | Source: Ambulatory Visit | Attending: Nephrology | Admitting: Nephrology

## 2017-07-23 DIAGNOSIS — N184 Chronic kidney disease, stage 4 (severe): Secondary | ICD-10-CM | POA: Diagnosis present

## 2017-07-23 LAB — FERRITIN: FERRITIN: 808 ng/mL — AB (ref 11–307)

## 2017-07-23 LAB — POCT HEMOGLOBIN-HEMACUE: HEMOGLOBIN: 11.8 g/dL — AB (ref 12.0–15.0)

## 2017-07-23 LAB — IRON AND TIBC
Iron: 122 ug/dL (ref 28–170)
Saturation Ratios: 37 % — ABNORMAL HIGH (ref 10.4–31.8)
TIBC: 332 ug/dL (ref 250–450)
UIBC: 210 ug/dL

## 2017-07-23 MED ORDER — EPOETIN ALFA 40000 UNIT/ML IJ SOLN
30000.0000 [IU] | INTRAMUSCULAR | Status: DC
Start: 2017-07-23 — End: 2017-07-24

## 2017-07-23 MED ORDER — EPOETIN ALFA 20000 UNIT/ML IJ SOLN
INTRAMUSCULAR | Status: AC
  Administered 2017-07-23: 20000 [IU] via SUBCUTANEOUS
  Filled 2017-07-23: qty 1

## 2017-07-23 MED ORDER — EPOETIN ALFA 10000 UNIT/ML IJ SOLN
INTRAMUSCULAR | Status: AC
  Administered 2017-07-23: 10:00:00 10000 [IU] via SUBCUTANEOUS
  Filled 2017-07-23: qty 1

## 2017-08-06 ENCOUNTER — Encounter (HOSPITAL_COMMUNITY)
Admission: RE | Admit: 2017-08-06 | Discharge: 2017-08-06 | Disposition: A | Discharge status: Home / Self Care | Payer: Medicare Other | Source: Ambulatory Visit | Attending: Nephrology | Admitting: Nephrology

## 2017-08-06 DIAGNOSIS — N184 Chronic kidney disease, stage 4 (severe): Secondary | ICD-10-CM | POA: Diagnosis not present

## 2017-08-06 LAB — POCT HEMOGLOBIN-HEMACUE: Hemoglobin: 11.5 g/dL — ABNORMAL LOW (ref 12.0–15.0)

## 2017-08-06 MED ORDER — EPOETIN ALFA 20000 UNIT/ML IJ SOLN
INTRAMUSCULAR | Status: AC
  Administered 2017-08-06: 10:00:00 20000 [IU]
  Filled 2017-08-06: qty 1

## 2017-08-06 MED ORDER — EPOETIN ALFA 10000 UNIT/ML IJ SOLN
INTRAMUSCULAR | Status: AC
  Administered 2017-08-06: 10:00:00 10000 [IU]
  Filled 2017-08-06: qty 1

## 2017-08-06 MED ORDER — EPOETIN ALFA 40000 UNIT/ML IJ SOLN: 30000.0000 [IU] | INTRAMUSCULAR | Status: DC

## 2017-08-20 ENCOUNTER — Encounter (HOSPITAL_COMMUNITY)
Admission: RE | Admit: 2017-08-20 | Discharge: 2017-08-20 | Disposition: A | Discharge status: Home / Self Care | Payer: Medicare Other | Source: Ambulatory Visit | Attending: Nephrology | Admitting: Nephrology

## 2017-08-20 VITALS — BP 177/88 | HR 74 | Temp 98.2°F | Resp 20

## 2017-08-20 DIAGNOSIS — N184 Chronic kidney disease, stage 4 (severe): Secondary | ICD-10-CM | POA: Diagnosis present

## 2017-08-20 LAB — IRON AND TIBC
IRON: 49 ug/dL (ref 28–170)
Saturation Ratios: 15 % (ref 10.4–31.8)
TIBC: 321 ug/dL (ref 250–450)
UIBC: 272 ug/dL

## 2017-08-20 LAB — FERRITIN: Ferritin: 494 ng/mL — ABNORMAL HIGH (ref 11–307)

## 2017-08-20 MED ORDER — EPOETIN ALFA 40000 UNIT/ML IJ SOLN: 30000.0000 [IU] | INTRAMUSCULAR | Status: DC

## 2017-08-21 LAB — POCT HEMOGLOBIN-HEMACUE: HEMOGLOBIN: 13.7 g/dL (ref 12.0–15.0)

## 2017-08-30 ENCOUNTER — Other Ambulatory Visit: Payer: Self-pay

## 2017-08-30 ENCOUNTER — Emergency Department (HOSPITAL_COMMUNITY)
Admission: EM | Admit: 2017-08-30 | Discharge: 2017-08-31 | Disposition: A | Discharge status: Home / Self Care | Payer: Medicare Other | Attending: Emergency Medicine | Admitting: Emergency Medicine

## 2017-08-30 ENCOUNTER — Encounter (HOSPITAL_COMMUNITY): Payer: Self-pay

## 2017-08-30 ENCOUNTER — Emergency Department (HOSPITAL_COMMUNITY): Payer: Medicare Other

## 2017-08-30 DIAGNOSIS — Z79899 Other long term (current) drug therapy: Secondary | ICD-10-CM | POA: Diagnosis not present

## 2017-08-30 DIAGNOSIS — I129 Hypertensive chronic kidney disease with stage 1 through stage 4 chronic kidney disease, or unspecified chronic kidney disease: Secondary | ICD-10-CM | POA: Insufficient documentation

## 2017-08-30 DIAGNOSIS — F03918 Unspecified dementia, unspecified severity, with other behavioral disturbance: Secondary | ICD-10-CM | POA: Diagnosis present

## 2017-08-30 DIAGNOSIS — Z008 Encounter for other general examination: Secondary | ICD-10-CM

## 2017-08-30 DIAGNOSIS — F0391 Unspecified dementia with behavioral disturbance: Secondary | ICD-10-CM | POA: Insufficient documentation

## 2017-08-30 DIAGNOSIS — F22 Delusional disorders: Secondary | ICD-10-CM | POA: Diagnosis present

## 2017-08-30 DIAGNOSIS — N184 Chronic kidney disease, stage 4 (severe): Secondary | ICD-10-CM | POA: Diagnosis not present

## 2017-08-30 DIAGNOSIS — Z046 Encounter for general psychiatric examination, requested by authority: Secondary | ICD-10-CM

## 2017-08-30 DIAGNOSIS — Z049 Encounter for examination and observation for unspecified reason: Secondary | ICD-10-CM

## 2017-08-30 LAB — CBC WITH DIFFERENTIAL/PLATELET
Basophils Absolute: 0 10*3/uL (ref 0.0–0.1)
Basophils Relative: 0 %
EOS ABS: 0 10*3/uL (ref 0.0–0.7)
Eosinophils Relative: 1 %
HEMATOCRIT: 44 % (ref 36.0–46.0)
HEMOGLOBIN: 14.5 g/dL (ref 12.0–15.0)
Lymphocytes Relative: 20 %
Lymphs Abs: 1.4 10*3/uL (ref 0.7–4.0)
MCH: 32.5 pg (ref 26.0–34.0)
MCHC: 33 g/dL (ref 30.0–36.0)
MCV: 98.7 fL (ref 78.0–100.0)
Monocytes Absolute: 0.4 10*3/uL (ref 0.1–1.0)
Monocytes Relative: 6 %
NEUTROS ABS: 5.2 10*3/uL (ref 1.7–7.7)
NEUTROS PCT: 73 %
Platelets: 136 10*3/uL — ABNORMAL LOW (ref 150–400)
RBC: 4.46 MIL/uL (ref 3.87–5.11)
RDW: 16.1 % — ABNORMAL HIGH (ref 11.5–15.5)
WBC: 7 10*3/uL (ref 4.0–10.5)

## 2017-08-30 LAB — ACETAMINOPHEN LEVEL

## 2017-08-30 LAB — RAPID URINE DRUG SCREEN, HOSP PERFORMED
AMPHETAMINES: NOT DETECTED
Barbiturates: NOT DETECTED
Benzodiazepines: NOT DETECTED
Cocaine: NOT DETECTED
OPIATES: NOT DETECTED
Tetrahydrocannabinol: NOT DETECTED

## 2017-08-30 LAB — COMPREHENSIVE METABOLIC PANEL
ALK PHOS: 58 U/L (ref 38–126)
ALT: 19 U/L (ref 14–54)
AST: 27 U/L (ref 15–41)
Albumin: 4 g/dL (ref 3.5–5.0)
Anion gap: 8 (ref 5–15)
BILIRUBIN TOTAL: 2.3 mg/dL — AB (ref 0.3–1.2)
BUN: 32 mg/dL — AB (ref 6–20)
CALCIUM: 10 mg/dL (ref 8.9–10.3)
CHLORIDE: 106 mmol/L (ref 101–111)
CO2: 26 mmol/L (ref 22–32)
CREATININE: 1.2 mg/dL — AB (ref 0.44–1.00)
GFR, EST AFRICAN AMERICAN: 48 mL/min — AB (ref >60)
GFR, EST NON AFRICAN AMERICAN: 42 mL/min — AB (ref >60)
Glucose, Bld: 79 mg/dL (ref 65–99)
Potassium: 3.9 mmol/L (ref 3.5–5.1)
Sodium: 140 mmol/L (ref 135–145)
TOTAL PROTEIN: 7.3 g/dL (ref 6.5–8.1)

## 2017-08-30 LAB — URINALYSIS, ROUTINE W REFLEX MICROSCOPIC
BACTERIA UA: NONE SEEN
BILIRUBIN URINE: NEGATIVE
Glucose, UA: NEGATIVE mg/dL
HGB URINE DIPSTICK: NEGATIVE
Ketones, ur: 5 mg/dL — AB
NITRITE: NEGATIVE
PH: 5 (ref 5.0–8.0)
Protein, ur: NEGATIVE mg/dL
SPECIFIC GRAVITY, URINE: 1.016 (ref 1.005–1.030)

## 2017-08-30 LAB — SALICYLATE LEVEL

## 2017-08-30 LAB — ETHANOL

## 2017-08-30 MED ORDER — AMLODIPINE BESYLATE 5 MG PO TABS
5.0000 mg | ORAL_TABLET | Freq: Every day | ORAL | Status: DC
  Administered 2017-08-30 – 2017-08-31 (×2): 5 mg via ORAL
  Filled 2017-08-30 (×2): qty 1

## 2017-08-30 NOTE — ED Notes (Signed)
MADE TWO UNSUCCESSFUL ATTEMPTS TO COLLECT BLOOD SAMPLES

## 2017-08-30 NOTE — BH Assessment (Signed)
[facility] Assessment Progress Note  Case was staffed with Reita Cliche DNP who recommended a inpatient admission (Geropsychiatry).

## 2017-08-30 NOTE — ED Notes (Signed)
PT UP GETTING DRESSED. PT EXPRESSED PERSON SITTING WITH HUSBAND ( HUSBAND HAS DEMENTIA) HAS TO LEAVE. SHE HAS TO BE DISCHARGED. LABS HAVE NOT BEEN DRAWN. EDPA MICHAEL MADE AWARE. IN TO SEE PT. INQUIRED WITH EDPA MICHAEL IF PT IS TO STAY, VOL OR INVOL? RESPONSE- PT AT PRESENT WILL REMAIN FOR LAB DRAWN AND RESULTS. PT IS CALM AND COOPERATIVE. PT MUST HAVE A RIDE TO GO HOME.

## 2017-08-30 NOTE — ED Notes (Signed)
[facility] [phone]  # for report

## 2017-08-30 NOTE — ED Notes (Addendum)
PT UP WALKING WITH CLOTHES ON STATING SHE IS READY TO LEAVE. PT WALKED TO BATHROOM AND PLACED IN PAPER SCRUBS. PT COOPERATIVE WITH CARE. UA OBTAINED. PT MADE AWARE SON CALLED. PT NEEDING REMINDERS AND CUES TO MAINTAIN COOPERATION AND UNDERSTANDING. PT CALM AND STATES SHE IS HUNGRY. CHARGE MORGAN RN MADE AWARE. EDPA MICHAEL MADE AWARE OF PT'S ATTEMPTS TO LEAVE. SON'S ARRANGEMENTS TO GET HERE TO [place].

## 2017-08-30 NOTE — ED Notes (Signed)
EDPA Provider at bedside. 

## 2017-08-30 NOTE — BH Assessment (Addendum)
Assessment Note  [name] is an 82 y.o. female that presents this date with active AVH stating she has been "having animals come into her home and bite her." Patient is oriented to place only and presents with a pleasant affect. Patient displays some active thought blocking but when prompted has the ability to answer certain questions associated with assessment. Collateral could not be obtained from any relatives listed in her chart. Patient is very focused on people who are trying to harm her although there is no evidence per note review. Patient states she has been having "men with torches come into her yard that is going to harm her." Patient per note review, was last seen on 06/29/17 due to being   "unable to make lucid decisions." Patient's mental health history is vague and patient is a poor historian. Patient is requesting to leave and asks this Probation officer to "help her get outside." As this Probation officer attempts to explain the mental health evaluation process the patient becomes agitated and asks this Probation officer to leave the room because "she is leaving."  Per notes, patient presents [facility] from her residence. Patient reports her arms are burning and  states she feels "that a animal scratched her arms in the night." Sheriff present states he has been called to the house multiple times in the past for visual hallucinations of people looking in window carrying sticks of fire, people with guns in her house. This was not currently today. Husband at house did not have any additional information. Denies any other complaints. No evidence of infestation of animals or insects. [place] PA has initiated a IVC as more information will be obtained once relatives can be reached to assist with collateral information. Per note review patient's son is en route from [facility]. Case was staffed with Reita Cliche DNP who recommended a inpatient admission (Geropsychiatry).       Diagnosis: Unspecified psychosis   Past Medical History:   Past Medical History:  Diagnosis Date  . Crohn disease ([place])   . Crohn's disease ([employer])   . Hypertension     Past Surgical History:  Procedure Laterality Date  . BOWEL RESECTION    . COLECTOMY      Family History: No family history on file.  Social History:  reports that  has never smoked. she has never used smokeless tobacco. She reports that she does not drink alcohol or use drugs.  Additional Social History:  Alcohol / Drug Use Pain Medications: See MAR Prescriptions: See MAR Over the Counter: See MAR History of alcohol / drug use?: No history of alcohol / drug abuse Longest period of sobriety (when/how long): (Denies) Negative Consequences of Use: (Denies) Withdrawal Symptoms: (Denies)  CIWA: CIWA-Ar BP: 127/66 Pulse Rate: 83 COWS:    Allergies:  Allergies  Allergen Reactions  . Sulfa Antibiotics Anaphylaxis and Rash  . Codeine Nausea And Vomiting    Home Medications:  (Not in a hospital admission)  OB/GYN Status:  No LMP recorded. Patient is postmenopausal.  General Assessment Data [facility] of Assessment: [facility] TTS Assessment: In system Is this a Tele or Face-to-Face Assessment?: Face-to-Face Is this an Initial Assessment or a Re-assessment for this encounter?: Initial Assessment Marital status: Married(Per note review) Maiden name: NA Is patient pregnant?: No Pregnancy Status: No Living Arrangements: Children, Spouse/significant other Can pt return to current living arrangement?: Yes Admission Status: Voluntary Is patient capable of signing voluntary admission?: Yes Referral Source: Self/Family/Friend Insurance type: Medicare  Medical Screening Exam [facility]  Walk-in ONLY) Medical Exam completed: Yes  Crisis Care Plan Living Arrangements: Children, Spouse/significant other Legal Guardian: Other:(NA) Name of Psychiatrist: None Name of Therapist: None  Education Status Is patient currently in school?: No Current Grade: NA Highest grade of school  patient has completed: (College per notes) Name of school: (NA) Contact person: (NA)  Risk to self with the past 6 months Suicidal Ideation: No Has patient been a risk to self within the past 6 months prior to admission? : No Suicidal Intent: No Has patient had any suicidal intent within the past 6 months prior to admission? : No Is patient at risk for suicide?: No Suicidal Plan?: No Has patient had any suicidal plan within the past 6 months prior to admission? : No Access to Means: No What has been your use of drugs/alcohol within the last 12 months?: Denies Previous Attempts/Gestures: No How many times?: 0 Other Self Harm Risks: NA Triggers for Past Attempts: Unknown Intentional Self Injurious Behavior: None Family Suicide History: No Recent stressful life event(s): Other (Comment)(Unknown) Persecutory voices/beliefs?: No Depression: No Depression Symptoms: (Pt denies) Substance abuse history and/or treatment for substance abuse?: No Suicide prevention information given to non-admitted patients: Not applicable  Risk to Others within the past 6 months Homicidal Ideation: No Does patient have any lifetime risk of violence toward others beyond the six months prior to admission? : No Thoughts of Harm to Others: No-Not Currently Present/Within Last 6 Months Current Homicidal Intent: No Current Homicidal Plan: No Access to Homicidal Means: No Identified Victim: NA History of harm to others?: No Assessment of Violence: None Noted Violent Behavior Description: NA Does patient have access to weapons?: No Criminal Charges Pending?: No Does patient have a court date: No Is patient on probation?: No  Psychosis Hallucinations: Auditory, Visual Delusions: None noted  Mental Status Report Appearance/Hygiene: Unremarkable Eye Contact: Fair Motor Activity: Freedom of movement Speech: Tangential Level of Consciousness: Quiet/awake Mood: Pleasant Affect: Appropriate to  circumstance Anxiety Level: Minimal Thought Processes: Thought Blocking Judgement: Unimpaired Orientation: Place Obsessive Compulsive Thoughts/Behaviors: None  Cognitive Functioning Concentration: Decreased Memory: Recent Intact IQ: Average Insight: Fair Impulse Control: Fair Appetite: Good Weight Loss: 0 Weight Gain: 0 Sleep: No Change Total Hours of Sleep: 6 Vegetative Symptoms: None  ADLScreening [facility] Assessment Services) Patient's cognitive ability adequate to safely complete daily activities?: Yes Patient able to express need for assistance with ADLs?: Yes Independently performs ADLs?: Yes (appropriate for developmental age)  Prior Inpatient Therapy Prior Inpatient Therapy: Yes Prior Therapy Dates: 2018 Prior Therapy Facilty/Provider(s): [facility] Reason for Treatment: MH issues  Prior Outpatient Therapy Prior Outpatient Therapy: No Prior Therapy Dates: NA Prior Therapy Facilty/Provider(s): NA Reason for Treatment: NA Does patient have an ACCT team?: No Does patient have Intensive In-House Services?  : No Does patient have [facility] services? : No Does patient have P4CC services?: No  ADL Screening (condition at time of admission) Patient's cognitive ability adequate to safely complete daily activities?: Yes Is the patient deaf or have difficulty hearing?: No Does the patient have difficulty seeing, even when wearing glasses/contacts?: No Does the patient have difficulty concentrating, remembering, or making decisions?: No Patient able to express need for assistance with ADLs?: Yes Does the patient have difficulty dressing or bathing?: No Independently performs ADLs?: Yes (appropriate for developmental age) Does the patient have difficulty walking or climbing stairs?: No Weakness of Legs: None Weakness of Arms/Hands: None  Home Assistive Devices/Equipment Home Assistive Devices/Equipment: None  Therapy Consults (therapy consults require a physician order)  PT  Evaluation Needed: No OT Evalulation Needed: No SLP Evaluation Needed: No Abuse/Neglect Assessment (Assessment to be complete while patient is alone) Physical Abuse: Denies Verbal Abuse: Denies Sexual Abuse: Denies Self-Neglect: Denies Values / Beliefs Cultural Requests During Hospitalization: None Spiritual Requests During Hospitalization: None Consults Spiritual Care Consult Needed: No Social Work Consult Needed: No Regulatory affairs officer (For Healthcare) Does Patient Have a Medical Advance Directive?: No Would patient like information on creating a medical advance directive?: No - Patient declined    Additional Information 1:1 In Past 12 Months?: No CIRT Risk: No Elopement Risk: No Does patient have medical clearance?: Yes     Disposition: Case was staffed with Reita Cliche DNP who recommended a inpatient admission (Geropsychiatry).       Disposition Initial Assessment Completed for this Encounter: Yes Disposition of Patient: Inpatient treatment program Type of inpatient treatment program: Adult  On Site Evaluation by:   Reviewed with Physician:    Mamie Nick 08/30/2017 2:12 PM

## 2017-08-30 NOTE — ED Notes (Signed)
TTS Provider at bedside.

## 2017-08-30 NOTE — ED Notes (Signed)
Gave report to Marlou Starks, RN at [place] [facility] at [facility]. Patient will be transported via [facility] to Room 153-1.

## 2017-08-30 NOTE — ED Triage Notes (Signed)
Per [facility]- Pt resides from home. Pt reports arms burning BIL. Pt states she feels "She feel and animal scratched her arms in the night." Sheriff present states he has been called to the house multiple times in the past for visual hallucinations of people looking in window carrying sticks of fire, people with guns in her house. This was not currently today. Husband at house did not have any additional information. Denies any other complaints.  NO evidence of infestation of animals or insects. HX of mental illness per sheriff.

## 2017-08-30 NOTE — ED Notes (Addendum)
IVC PAPERS IN PROGRESS- SEE EDPA MICHAEL

## 2017-08-30 NOTE — ED Notes (Signed)
ED Provider at bedside.  IVC by EDP

## 2017-08-30 NOTE — ED Provider Notes (Signed)
[facility] DEPT Provider Note   CSN: [id number] Arrival date & time: 08/30/17  1950     History   Chief Complaint Chief Complaint  Patient presents with  . Medical Clearance    HPI [name] is a 82 y.o. female with a history of Crohn's disease, hypertension and chronic kidney disease who presents the emergency department today by EMS for possible medical clearance.  Patient states that she woke this morning with a sensation that she feels animals scratched or bit her arms and back.  [place] marks from tiny creatures around her house quite frequently.  She feels that when it rains the animals, enter her house and bite her.  She has never seen these animals but knows it must be them.  Per triage note [facility] states she has been called to the house multiple times in the past visual hallucinations of people looking in the window caring sticks of fire, people with guns in her house.  She is not reporting this today.  She says she lives at home with her husband who has advanced Alzheimer's and she is a primary caregiver per her.   Patient notes that the areas where she bitten are very pruritic. She denies any associated fever, chills, contacts with persons with similar rash, or any changes in lotions/soaps/detergents, swelling or purulent discharge. No new medications. No recent travel. No recent tick bites. No involvement to palms/soles or between webspaces.  Patient also states she feels off balance when she walks.  This is been ongoing issue for approximately 6 months.  She states she never falls due to this but just feels like she is not walking a straight line.  She denies any HA, focal weakness, CP, SOB, palpitations, melena, diaphoresis, recent URI, changes in hearing, unilateral neck pain, unilateral weakness, facial asymmetry, difficulty with speech, LOC, N/V, alcohol/drug use, or trauma.  Patient  denies any abdominal pain or urinary symptoms including dysuria, flank pain, suprapubic pain, frequency, urgency, or hematuria. No SI/HI.   HPI  Past Medical History:  Diagnosis Date  . Crohn disease ([place])   . Crohn's disease ([place])   . Hypertension     Patient Active Problem List   Diagnosis Date Noted  . 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 CKD (chronic kidney disease) stage 4, GFR 15-29 ml/min (HCC) 01/31/2017  . Palpitations 08/29/2016  . TIA (transient ischemic attack) 02/02/2014    Past Surgical History:  Procedure Laterality Date  . BOWEL RESECTION    . COLECTOMY      OB History    No data available       Home Medications    Prior to Admission medications   Medication Sig Start Date End Date Taking? Authorizing Provider  amLODipine (NORVASC) 5 MG tablet Take 5 mg by mouth daily.    [provider]  calcitRIOL (ROCALTROL) 0.25 MCG capsule Take 0.25 mcg by mouth every other day. 05/06/17   [provider]  hydroxypropyl methylcellulose (ISOPTO TEARS) 2.5 % ophthalmic solution Place 1 drop into both eyes 4 (four) times daily as needed for dry eyes.    [provider]  latanoprost (XALATAN) 0.005 % ophthalmic solution Place 1 drop into both eyes at bedtime. 04/04/17   [provider]    Family History No family history on file.  Social History Social History   Tobacco Use  . Smoking status: Never Smoker  .  Smokeless tobacco: Never Used  Substance Use Topics  . Alcohol use: No  . Drug use: No     Allergies   Sulfa antibiotics and Codeine   Review of Systems Review of Systems  All other systems reviewed and are negative.    Physical Exam Updated Vital Signs Ht 5' 2"  (1.575 m)   Wt 51.3 kg (113 lb)   BMI 20.67 kg/m   Physical Exam  Constitutional: She appears well-developed.  Pleasant, elderly frail female.  HENT:  Head: Normocephalic and atraumatic.  Right Ear: External ear normal.  Left Ear: External  ear normal.  Nose: Nose normal.  Mouth/Throat: Uvula is midline, oropharynx is clear and moist and mucous membranes are normal. No tonsillar exudate.  Eyes: Pupils are equal, round, and reactive to light. Right eye exhibits no discharge. Left eye exhibits no discharge. No scleral icterus.  Neck: Trachea normal. Neck supple. No spinous process tenderness present. No neck rigidity. Normal range of motion present.  Cardiovascular: Normal rate, regular rhythm and intact distal pulses.  No murmur heard. Pulses:      Radial pulses are 2+ on the right side, and 2+ on the left side.       Dorsalis pedis pulses are 2+ on the right side, and 2+ on the left side.       Posterior tibial pulses are 2+ on the right side, and 2+ on the left side.  No lower extremity swelling or edema. Calves symmetric in size bilaterally.  Pulmonary/Chest: Effort normal and breath sounds normal. She exhibits no tenderness.  Abdominal: Soft. Bowel sounds are normal. There is no tenderness. There is no rebound and no guarding.  Musculoskeletal: She exhibits no edema.  Lymphadenopathy:    She has no cervical adenopathy.  Neurological: She is alert.  Mental Status:  Alert.  She has learned to person and place.  She is not oriented to time.  Speech fluent without evidence of aphasia. Able to follow 2 step commands without difficulty.  Cranial Nerves:  II:  Peripheral visual fields grossly normal, pupils equal, round, reactive to light III,IV, VI: ptosis not present, extra-ocular motions intact bilaterally  V,VII: smile symmetric, eyebrows raise symmetric, facial light touch sensation equal VIII: hearing grossly normal to voice  X: uvula elevates symmetrically  XI: bilateral shoulder shrug symmetric and strong XII: midline tongue extension without fassiculations Motor:  Normal tone. 5/5 in upper and lower extremities bilaterally including strong and equal grip strength and dorsiflexion/plantar flexion Sensory: Sensation  intact to light touch in all extremities.  Cerebellar: normal finger-to-nose with bilateral upper extremities. No pronator drift.  CV: distal pulses palpable throughout   Skin: Skin is warm and dry. No rash noted. She is not diaphoretic.  No blisters, no pustules, no warmth, no draining sinus tracts, no superficial abscesses, no bullous impetigo, no vesicles, no desquamation, no target lesions with dusky purpura or a central bulla.  There are tiny scratch marks to the right antecubital space, but skin otherwise unremarkable.   Psychiatric: She has a normal mood and affect.  Nursing note and vitals reviewed.    ED Treatments / Results  Labs (all labs ordered are listed, but only abnormal results are displayed) Labs Reviewed  COMPREHENSIVE METABOLIC PANEL - Abnormal; Notable for the following components:      Result Value   BUN 32 (*)    Creatinine, Ser 1.20 (*)    Total Bilirubin 2.3 (*)    GFR calc non Af Amer 42 (*)  GFR calc Af Amer 48 (*)    All other components within normal limits  CBC WITH DIFFERENTIAL/PLATELET - Abnormal; Notable for the following components:   RDW 16.1 (*)    Platelets 136 (*)    All other components within normal limits  ACETAMINOPHEN LEVEL - Abnormal; Notable for the following components:   Acetaminophen (Tylenol), Serum <10 (*)    All other components within normal limits  URINALYSIS, ROUTINE W REFLEX MICROSCOPIC - Abnormal; Notable for the following components:   Ketones, ur 5 (*)    Leukocytes, UA TRACE (*)    Squamous Epithelial / LPF 0-5 (*)    All other components within normal limits  ETHANOL  RAPID URINE DRUG SCREEN, HOSP PERFORMED  SALICYLATE LEVEL    EKG  EKG Interpretation None       Radiology Dg Chest 2 View  Result Date: 08/30/2017 CLINICAL DATA:  82 year old female for medical clearance. EXAM: CHEST  2 VIEW COMPARISON:  None. FINDINGS: Cardiomegaly again noted. There is no evidence of focal airspace disease, pulmonary edema,  suspicious pulmonary nodule/mass, pleural effusion, or pneumothorax. No acute bony abnormalities are identified. IMPRESSION: Cardiomegaly without evidence of acute cardiopulmonary disease. Electronically Signed   By: Margarette Canada M.D.   On: 08/30/2017 15:35    Procedures Procedures (including critical care time)  Medications Ordered in ED Medications  amLODipine (NORVASC) tablet 5 mg (5 mg Oral Given 08/30/17 1550)     Initial Impression / Assessment and Plan / ED Course  I have reviewed the triage vital signs and the nursing notes.  Pertinent labs & imaging results that were available during my care of the patient were reviewed by me and considered in my medical decision making (see chart for details).     82 year old female presenting with paranoia and hallucinations.  She had similar presentation on 9/12 where she had negative workup including labs and head CT.  Labs are reassuring and near baseline.  Trace leukocytes with no bacteria and negative nitrates.  Patient is asymptomatic.  This was not a clean-catch.  Do not suspect this is due to infection.  Chest x-ray reassuring.  Patient was IVC'd during the process of workup as she was attempting to leave and did not feel she could provide proper care for herself at home. Family made aware of patient presence in the department. TTS recommended psychiatric hospitalization with Geropsychiatry. Home meds reordered and patient placed in psych hold.   Final Clinical Impressions(s) / ED Diagnoses   Final diagnoses:  Involuntary commitment  Medical clearance for psychiatric admission    ED Discharge Orders    None       Lorelle Gibbs 08/30/17 1606    Dorie Rank, MD 09/01/17 [phone]

## 2017-08-30 NOTE — ED Notes (Signed)
Bed: SM27 Expected date:  Expected time:  Means of arrival:  Comments: Ems-possible psych

## 2017-08-30 NOTE — BH Assessment (Addendum)
[employer] Assessment Progress Note  Per Waylan Boga, DNP, this pt requires psychiatric hospitalization at this time.  The following facilities have been contacted to seek placement for this pt, with results as noted:  Beds available, information sent, decision pending:  [place]:  [facility], [place] Triage Specialist [phone]

## 2017-08-30 NOTE — ED Notes (Signed)
Call Sherriff's office at [phone] and left message for patient to be transported to [facility] in the am.

## 2017-08-30 NOTE — ED Notes (Signed)
SPEAKING WITH JERRY MARIN II ( SON) [place] [phone] CURRENTLY ON PHONE. NO ONE AT [facility]. DOES HAVE A BROTHER IN [place] HOWEVER UNABLE TO REACH FAMILY AT THIS TIME.

## 2017-08-30 NOTE — ED Notes (Signed)
RN needs to call [phone] sheriffs office after 8pm tonight to request patient tx for in the am to [facility]

## 2017-08-30 NOTE — ED Notes (Signed)
TCU RN AMANDA AWARE CREAM LONG SLEEVE SHIRT AND SEVERAL RINGS REMAIN ON THIS PT. PT AT PRESENT COOPERATIVE WITH CARE. AMANDA RN  AGREES TO COMMUNICATE WITH PT AND ATTEMPT TO REMOVE ITEMS. [employer]RAWFORD SECURITY WILL WAND PT UPON ARRIVAL TO TCU. BELONGING BAGS X 2 WITH PT TO TCU. PT TRANSFERRED BY [facility] EMT VIA WHEELCHAIR. PT WITHOUT COMPLAINT. IVC IN PROGRESS.

## 2017-08-30 NOTE — BH Assessment (Signed)
[facility] Assessment Progress Note  Per Waylan Boga, FNP, this pt requires psychiatric hospitalization at this time.  Pt presents under IVC initiated by EDP Dorie Rank, MD.  At 16:23 Gerald Stabs calls from [facility] to report that pt has been accepted to their facility by Dr Caren Hazy to the [place] [facility], Rm 153-1.  [facility] stipulates that pt must arrive either before 23:00 tonight, or after 06:00 tomorrow.  Theodoro Clock concurs with this decision.  Pt's nurse, Estill Bamberg, has been notified, and agrees to call report to [phone].  Pt is to be transported via [facility].  Jalene Mullet, [place] Triage Specialist [phone]

## 2017-08-31 DIAGNOSIS — F29 Unspecified psychosis not due to a substance or known physiological condition: Secondary | ICD-10-CM | POA: Diagnosis present

## 2017-08-31 DIAGNOSIS — F22 Delusional disorders: Secondary | ICD-10-CM | POA: Diagnosis not present

## 2017-08-31 NOTE — ED Provider Notes (Signed)
Patient seen and examined by myself.  She is well-appearing this morning.  She continues to exhibit some paranoia, but vital signs are stable.  Lab work reviewed and is unremarkable.  She is accepted to memory unit at this morning, and she remains hemodynamically stable and medically stable for psychiatric disposition.   Duffy Bruce, MD 08/31/17 [phone]

## 2017-08-31 NOTE — ED Notes (Signed)
Sheriff called and said he would be here around 10AM to take patient to [facility].

## 2017-09-02 DIAGNOSIS — R4189 Other symptoms and signs involving cognitive functions and awareness: Secondary | ICD-10-CM | POA: Insufficient documentation

## 2017-09-03 ENCOUNTER — Inpatient Hospital Stay (HOSPITAL_COMMUNITY)
Admission: RE | Admit: 2017-09-03 | Discharge: 2017-09-03 | Disposition: A | Discharge status: Home / Self Care | Payer: Medicare Other | Source: Ambulatory Visit | Attending: Nephrology | Admitting: Nephrology

## 2017-09-06 DIAGNOSIS — F23 Brief psychotic disorder: Secondary | ICD-10-CM | POA: Insufficient documentation

## 2017-09-25 ENCOUNTER — Inpatient Hospital Stay (HOSPITAL_COMMUNITY)
Admission: RE | Admit: 2017-09-25 | Discharge: 2017-09-25 | Disposition: A | Discharge status: Home / Self Care | Payer: Medicare Other | Source: Ambulatory Visit | Attending: Nephrology | Admitting: Nephrology

## 2017-09-26 ENCOUNTER — Other Ambulatory Visit: Payer: Self-pay

## 2017-09-26 ENCOUNTER — Emergency Department (HOSPITAL_COMMUNITY)
Admission: EM | Admit: 2017-09-26 | Discharge: 2017-09-26 | Disposition: A | Discharge status: Home / Self Care | Payer: Medicare Other | Attending: Emergency Medicine | Admitting: Emergency Medicine

## 2017-09-26 ENCOUNTER — Emergency Department (HOSPITAL_COMMUNITY): Payer: Medicare Other

## 2017-09-26 ENCOUNTER — Encounter (HOSPITAL_COMMUNITY): Payer: Self-pay | Admitting: Emergency Medicine

## 2017-09-26 DIAGNOSIS — F0391 Unspecified dementia with behavioral disturbance: Secondary | ICD-10-CM | POA: Diagnosis not present

## 2017-09-26 DIAGNOSIS — Z8673 Personal history of transient ischemic attack (TIA), and cerebral infarction without residual deficits: Secondary | ICD-10-CM | POA: Insufficient documentation

## 2017-09-26 DIAGNOSIS — R443 Hallucinations, unspecified: Secondary | ICD-10-CM | POA: Insufficient documentation

## 2017-09-26 DIAGNOSIS — Z79899 Other long term (current) drug therapy: Secondary | ICD-10-CM | POA: Diagnosis not present

## 2017-09-26 DIAGNOSIS — F22 Delusional disorders: Secondary | ICD-10-CM | POA: Insufficient documentation

## 2017-09-26 DIAGNOSIS — F03918 Unspecified dementia, unspecified severity, with other behavioral disturbance: Secondary | ICD-10-CM | POA: Diagnosis present

## 2017-09-26 DIAGNOSIS — I129 Hypertensive chronic kidney disease with stage 1 through stage 4 chronic kidney disease, or unspecified chronic kidney disease: Secondary | ICD-10-CM | POA: Diagnosis not present

## 2017-09-26 DIAGNOSIS — N184 Chronic kidney disease, stage 4 (severe): Secondary | ICD-10-CM | POA: Diagnosis not present

## 2017-09-26 LAB — I-STAT CHEM 8, ED
BUN: 27 mg/dL — ABNORMAL HIGH (ref 6–20)
CHLORIDE: 102 mmol/L (ref 101–111)
Calcium, Ion: 1.21 mmol/L (ref 1.15–1.40)
Creatinine, Ser: 1.2 mg/dL — ABNORMAL HIGH (ref 0.44–1.00)
Glucose, Bld: 84 mg/dL (ref 65–99)
HCT: 41 % (ref 36.0–46.0)
HEMOGLOBIN: 13.9 g/dL (ref 12.0–15.0)
POTASSIUM: 4 mmol/L (ref 3.5–5.1)
Sodium: 140 mmol/L (ref 135–145)
TCO2: 26 mmol/L (ref 22–32)

## 2017-09-26 LAB — CBC WITH DIFFERENTIAL/PLATELET
Basophils Absolute: 0 10*3/uL (ref 0.0–0.1)
Basophils Relative: 0 %
EOS ABS: 0.1 10*3/uL (ref 0.0–0.7)
Eosinophils Relative: 1 %
HEMATOCRIT: 39.1 % (ref 36.0–46.0)
HEMOGLOBIN: 13.2 g/dL (ref 12.0–15.0)
LYMPHS ABS: 1.8 10*3/uL (ref 0.7–4.0)
LYMPHS PCT: 29 %
MCH: 31.7 pg (ref 26.0–34.0)
MCHC: 33.8 g/dL (ref 30.0–36.0)
MCV: 94 fL (ref 78.0–100.0)
MONOS PCT: 5 %
Monocytes Absolute: 0.3 10*3/uL (ref 0.1–1.0)
NEUTROS ABS: 4 10*3/uL (ref 1.7–7.7)
NEUTROS PCT: 65 %
PLATELETS: 131 10*3/uL — AB (ref 150–400)
RBC: 4.16 MIL/uL (ref 3.87–5.11)
RDW: 15.7 % — ABNORMAL HIGH (ref 11.5–15.5)
WBC: 6.2 10*3/uL (ref 4.0–10.5)

## 2017-09-26 LAB — VITAMIN B12: Vitamin B-12: 319 pg/mL (ref 180–914)

## 2017-09-26 LAB — URINALYSIS, ROUTINE W REFLEX MICROSCOPIC
Bilirubin Urine: NEGATIVE
Glucose, UA: NEGATIVE mg/dL
Hgb urine dipstick: NEGATIVE
Ketones, ur: NEGATIVE mg/dL
Leukocytes, UA: NEGATIVE
Nitrite: NEGATIVE
PROTEIN: NEGATIVE mg/dL
SPECIFIC GRAVITY, URINE: 1.015 (ref 1.005–1.030)
pH: 6 (ref 5.0–8.0)

## 2017-09-26 LAB — RAPID URINE DRUG SCREEN, HOSP PERFORMED
Amphetamines: NOT DETECTED
BENZODIAZEPINES: NOT DETECTED
Barbiturates: NOT DETECTED
COCAINE: NOT DETECTED
OPIATES: NOT DETECTED
Tetrahydrocannabinol: NOT DETECTED

## 2017-09-26 LAB — ETHANOL: Alcohol, Ethyl (B): <10 mg/dL (ref <10)

## 2017-09-26 LAB — AMMONIA: Ammonia: 38 umol/L — ABNORMAL HIGH (ref 9–35)

## 2017-09-26 MED ORDER — ONDANSETRON HCL 4 MG PO TABS: 4.0000 mg | ORAL_TABLET | Freq: Three times a day (TID) | ORAL | Status: DC | PRN

## 2017-09-26 MED ORDER — ARIPIPRAZOLE 5 MG PO TABS: 5.0000 mg | ORAL_TABLET | Freq: Every day | ORAL | 0 refills | Status: DC

## 2017-09-26 MED ORDER — QUETIAPINE FUMARATE 25 MG PO TABS: 12.5000 mg | ORAL_TABLET | Freq: Three times a day (TID) | ORAL | 0 refills | Status: DC

## 2017-09-26 MED ORDER — ACETAMINOPHEN 325 MG PO TABS: 650.0000 mg | ORAL_TABLET | ORAL | Status: DC | PRN

## 2017-09-26 MED ORDER — QUETIAPINE FUMARATE 25 MG PO TABS
12.5000 mg | ORAL_TABLET | Freq: Three times a day (TID) | ORAL | Status: DC
  Administered 2017-09-26 (×2): 12.5 mg via ORAL
  Filled 2017-09-26 (×2): qty 1

## 2017-09-26 MED ORDER — ARIPIPRAZOLE 5 MG PO TABS
5.0000 mg | ORAL_TABLET | Freq: Every day | ORAL | Status: DC
  Administered 2017-09-26: 5 mg via ORAL
  Filled 2017-09-26: qty 1

## 2017-09-26 MED ORDER — ALUM & MAG HYDROXIDE-SIMETH 200-200-20 MG/5ML PO SUSP: 30.0000 mL | Freq: Four times a day (QID) | ORAL | Status: DC | PRN

## 2017-09-26 NOTE — ED Notes (Signed)
Family will pick patient up for discharge at 6pm

## 2017-09-26 NOTE — ED Notes (Signed)
Pt transported to xray 

## 2017-09-26 NOTE — BH Assessment (Signed)
[facility] Assessment Progress Note  Per Buford Dresser, DO, this pt does not require psychiatric hospitalization at this time.  Pt is to be discharged from [facility].  Pt does not require any referrals for outpatient follow up.  Pt's nurse has been notified.  Jalene Mullet, [place] Triage Specialist [phone]

## 2017-09-26 NOTE — BH Assessment (Signed)
Assessment Note  [name] is an 82 y.o. female.  -Clinician was requested by Dr. Randal Buba to see patient in person.  Patient believes that another woman was trying to get into bed with her tonight.  Her husband told her there was no one else.  Patient went to son (who is living with them to help father) and said that a woman had tried to get in bed with her to sleep and had told her she was staying there.  Son and husband were concerned because patient was dressed to leave the house.  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    Clinician talked with son and husband.  Son said that he showed mother a picture of herself and husband from years ago but she did not recognize husband.  She will occasionally call son by her brother's name and it takes a few minutes to redirect her.  Son said that she is more delusional acting at night.  When clinician went in to talk to patient she did say that there was another woman in the house.  She said that she would not stand for husband to have another woman in the home, it was her and husband's home and no one else's.  Patient said that the woman told her she was going to stay.  She is seeing and hearing this person.  Patient complained about son showing her a picture of her and husband and said that this did not mean anything.    Clinician attempted to get patient to focus on other subjects but patient kept coming back to the idea that another woman was trying to get into the bed and ostensibly take over the house.  She said tha situations like this "is when people get hurt."  Patient does not appear to be a violent person but she may wander from the house if she thinks that another person is trying to come in and take over.  She had been dressed as if to leave the house in the cold last night.    Patient was seen on 08-30-17 at [facility] and had been IVC'ed and accepted to [facility] where she stayed until just before Thanksgiving.  Patient was  started on Adderall and son said it has been helpful but she has been worse as far as delusional thinking or beginnings of dementia over the last week.  Patient did have a intake session with Dr. Casimiro Needle a few days ago.  She has a follow up appointment with him on 10-17-17.  She is to have a neurology appt in February.  Pt denies any SI and HI.    -Clinician discussed patient care with Dr. Randal Buba who agreed that patient needed to be seen by psychiatry to determine if inpatient care is warranted at this time.   Diagnosis: Dementia  Past Medical History:  Past Medical History:  Diagnosis Date  . Crohn disease ([place])   . Crohn's disease ([place])   . Hypertension     Past Surgical History:  Procedure Laterality Date  . BOWEL RESECTION    . COLECTOMY      Family History: No family history on file.  Social History:  reports that  has never smoked. she has never used smokeless tobacco. She reports that she does not drink alcohol or use drugs.  Additional Social History:  Alcohol / Drug Use Pain Medications: See PTA medication list Prescriptions: See PTA medication list Over the  Counter: See PTA medication list History of alcohol / drug use?: No history of alcohol / drug abuse  CIWA: CIWA-Ar BP: (!) 160/96 Pulse Rate: 77 COWS:    Allergies:  Allergies  Allergen Reactions  . Sulfa Antibiotics Anaphylaxis and Rash  . Codeine Nausea And Vomiting  . Peanut-Containing Drug Products     Home Medications:  (Not in a hospital admission)  OB/GYN Status:  No LMP recorded. Patient is postmenopausal.  General Assessment Data Location of Assessment: [facility] ED TTS Assessment: In system Is this a Tele or Face-to-Face Assessment?: Face-to-Face Is this an Initial Assessment or a Re-assessment for this encounter?: Initial Assessment Marital status: Married Is patient pregnant?: No Pregnancy Status: No Living Arrangements: Children, Spouse/significant other Can pt return to current living  arrangement?: Yes Admission Status: Voluntary Is patient capable of signing voluntary admission?: Yes Referral Source: Self/Family/Friend Insurance type: Medicare     Crisis Care Plan Living Arrangements: Children, Spouse/significant other Name of Psychiatrist: Dr. Amalia Greenhouse (did intake a week ago) Name of Therapist: None  Education Status Is patient currently in school?: No Highest grade of school patient has completed: Nursing degree  Risk to self with the past 6 months Suicidal Ideation: No Has patient been a risk to self within the past 6 months prior to admission? : No Suicidal Intent: No Has patient had any suicidal intent within the past 6 months prior to admission? : No Is patient at risk for suicide?: No Suicidal Plan?: No Has patient had any suicidal plan within the past 6 months prior to admission? : No Access to Means: No What has been your use of drugs/alcohol within the last 12 months?: Denies Previous Attempts/Gestures: No How many times?: 0 Other Self Harm Risks: None Triggers for Past Attempts: None known Intentional Self Injurious Behavior: None Family Suicide History: No Recent stressful life event(s): Turmoil (Comment)(Pt dementia impacting safety.) Persecutory voices/beliefs?: Yes Depression: No Depression Symptoms: (Pt denies depressive symptoms.) Substance abuse history and/or treatment for substance abuse?: No Suicide prevention information given to non-admitted patients: Not applicable  Risk to Others within the past 6 months Homicidal Ideation: No Does patient have any lifetime risk of violence toward others beyond the six months prior to admission? : No Thoughts of Harm to Others: No Current Homicidal Intent: No Current Homicidal Plan: No Access to Homicidal Means: No Identified Victim: No one History of harm to others?: No Assessment of Violence: None Noted Violent Behavior Description: N/A Does patient have access to weapons?: No Criminal  Charges Pending?: No Does patient have a court date: No Is patient on probation?: No  Psychosis Hallucinations: Auditory, Visual(Seeing an hearing a woman in the house.) Delusions: Persecutory(Some woman coming in to the home.)  Mental Status Report Appearance/Hygiene: Unremarkable, In scrubs Eye Contact: Good Motor Activity: Freedom of movement, Unremarkable Speech: Tangential Level of Consciousness: Quiet/awake Mood: Anxious, Suspicious, Apprehensive, Helpless Affect: Anxious, Apprehensive, Irritable Anxiety Level: Moderate Thought Processes: Tangential Judgement: Impaired Orientation: Place, Time Obsessive Compulsive Thoughts/Behaviors: None  Cognitive Functioning Concentration: Decreased Memory: Recent Impaired, Remote Impaired IQ: Average Insight: Fair Impulse Control: Poor Appetite: Good Weight Loss: 0 Weight Gain: 0 Sleep: No Change Total Hours of Sleep: 6 Vegetative Symptoms: None  ADLScreening [facility] Assessment Services) Patient's cognitive ability adequate to safely complete daily activities?: Yes Patient able to express need for assistance with ADLs?: Yes Independently performs ADLs?: Yes (appropriate for developmental age)  Prior Inpatient Therapy Prior Inpatient Therapy: Yes Prior Therapy Dates: Nov '18 Prior Therapy Facilty/Provider(s): [facility] Reason  for Treatment: dementia  Prior Outpatient Therapy Prior Outpatient Therapy: No Prior Therapy Dates: Has appt on 10-17-17 Prior Therapy Facilty/Provider(s): Dr. Amalia Greenhouse Reason for Treatment: medication monitoring Does patient have an ACCT team?: No Does patient have Intensive In-House Services?  : No Does patient have [facility] services? : No Does patient have P4CC services?: No  ADL Screening (condition at time of admission) Patient's cognitive ability adequate to safely complete daily activities?: Yes Is the patient deaf or have difficulty hearing?: No Does the patient have difficulty seeing,  even when wearing glasses/contacts?: No Does the patient have difficulty concentrating, remembering, or making decisions?: Yes Patient able to express need for assistance with ADLs?: Yes Does the patient have difficulty dressing or bathing?: No Independently performs ADLs?: Yes (appropriate for developmental age) Does the patient have difficulty walking or climbing stairs?: No(Slow walking) Weakness of Legs: None Weakness of Arms/Hands: None       Abuse/Neglect Assessment (Assessment to be complete while patient is alone) Physical Abuse: Denies Verbal Abuse: Denies Sexual Abuse: Denies     Advance Directives (For Healthcare) Does Patient Have a Medical Advance Directive?: No Would patient like information on creating a medical advance directive?: No - Patient declined    Additional Information 1:1 In Past 12 Months?: No CIRT Risk: No Elopement Risk: No Does patient have medical clearance?: Yes     Disposition:  Disposition Initial Assessment Completed for this Encounter: Yes Disposition of Patient: Other dispositions Type of inpatient treatment program: Adult Other disposition(s): Other (Comment)(To be seen by psychiatry in AM.)  On Site Evaluation by:   Reviewed with Physician:    Raymondo Band 09/26/2017 7:51 AM

## 2017-09-26 NOTE — ED Notes (Signed)
Rn Lanelle Bal will start iv and collect labs.

## 2017-09-26 NOTE — BHH Suicide Risk Assessment (Signed)
Suicide Risk Assessment  Discharge Assessment   [facility] Discharge Suicide Risk Assessment   Principal Problem: Psychosis in elderly with behavioral disturbance Discharge Diagnoses:  Patient Active Problem List   Diagnosis Date Noted  . Psychosis in elderly with behavioral disturbance [F03.91] 08/30/2017    Priority: High  . 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 CKD (chronic kidney disease) stage 4, GFR 15-29 ml/min (HCC) [N18.4] 01/31/2017  . Palpitations [R00.2] 08/29/2016  . TIA (transient ischemic attack) [G45.9] 02/02/2014    Total Time spent with patient: 45 minutes  Musculoskeletal: Strength & Muscle Tone: within normal limits Gait & Station: normal Patient leans: N/A  Psychiatric Specialty Exam:   Blood pressure 116/65, pulse 69, temperature 97.8 F (36.6 C), temperature source Oral, resp. rate 20, height 5' 2"  (1.575 m), weight 41.7 kg (92 lb), SpO2 100 %.Body mass index is 16.83 kg/m.  General Appearance: Casual  Eye Contact::  Good  Speech:  Normal Rate409  Volume:  Normal  Mood:  Euthymic  Affect:  Appropriate and Congruent  Thought Process:  Coherent and Descriptions of Associations: Intact  Orientation:  Full (Time, Place, and Person)  Thought Content:  WDL and Logical  Suicidal Thoughts:  No  Homicidal Thoughts:  No  Memory:  Immediate;   Good Recent;   Poor Remote;   Poor  Judgement:  Fair  Insight:  Lacking  Psychomotor Activity:  Normal  Concentration:  Fair  Recall:  [place] of Knowledge:Good  Language: Good  Akathisia:  No  Handed:  Right  AIMS (if indicated):     Assets:  Housing Leisure Time Physical Health Resilience Social Support  Sleep:     Cognition: WNL  ADL's:  Intact   Mental Status Per Nursing Assessment::   On Admission:   82 yo female who presented to the ED via her family for delusions of her husband being a girlfriend and aggressive behaviors.  Medications were adjusted.  No suicidal/homicidal ideations,  hallucinations, or substance abuse.  Appointment with Dr Casimiro Needle on 1/3 and neuro in February.  Recently at [facility], family expects a dementia diagnosis and has brought social work in regarding placement.  Stable to discharge.  Demographic Factors:  Age 45 or older  Loss Factors: Decline in physical health  Historical Factors: Impulsivity  Risk Reduction Factors:   Sense of responsibility to family, Living with another person, especially a relative, Positive social support and Positive therapeutic relationship  Continued Clinical Symptoms:  NOne Cognitive Features That Contribute To Risk:  None    Suicide Risk:  Minimal: No identifiable suicidal ideation.  Patients presenting with no risk factors but with morbid ruminations; may be classified as minimal risk based on the severity of the depressive symptoms    Plan Of Care/Follow-up recommendations:  Activity:  as tolerated Diet:  heart healthy diet  LORD, JAMISON, NP 09/26/2017, 5:27 PM
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
## 2017-10-03 ENCOUNTER — Encounter (HOSPITAL_COMMUNITY)
Admission: RE | Admit: 2017-10-03 | Discharge: 2017-10-03 | Disposition: A | Discharge status: Home / Self Care | Payer: Medicare Other | Source: Ambulatory Visit | Attending: Nephrology | Admitting: Nephrology

## 2017-10-03 VITALS — BP 132/61 | HR 90 | Temp 97.9°F | Resp 20

## 2017-10-03 DIAGNOSIS — N184 Chronic kidney disease, stage 4 (severe): Secondary | ICD-10-CM | POA: Insufficient documentation

## 2017-10-03 LAB — IRON AND TIBC
IRON: 174 ug/dL — AB (ref 28–170)
Saturation Ratios: 47 % — ABNORMAL HIGH (ref 10.4–31.8)
TIBC: 368 ug/dL (ref 250–450)
UIBC: 194 ug/dL

## 2017-10-03 LAB — FERRITIN: FERRITIN: 791 ng/mL — AB (ref 11–307)

## 2017-10-03 MED ORDER — EPOETIN ALFA 40000 UNIT/ML IJ SOLN: 30000.0000 [IU] | INTRAMUSCULAR | Status: DC

## 2017-10-04 LAB — POCT HEMOGLOBIN-HEMACUE: HEMOGLOBIN: 13.4 g/dL (ref 12.0–15.0)

## 2017-10-22 ENCOUNTER — Encounter (HOSPITAL_COMMUNITY)
Admission: RE | Admit: 2017-10-22 | Discharge: 2017-10-22 | Disposition: A | Discharge status: Home / Self Care | Payer: Medicare Other | Source: Ambulatory Visit | Attending: Nephrology | Admitting: Nephrology

## 2017-10-22 VITALS — BP 135/65 | HR 89 | Temp 97.3°F | Resp 20

## 2017-10-22 DIAGNOSIS — N184 Chronic kidney disease, stage 4 (severe): Secondary | ICD-10-CM | POA: Diagnosis not present

## 2017-10-22 LAB — POCT HEMOGLOBIN-HEMACUE: Hemoglobin: 11.7 g/dL — ABNORMAL LOW (ref 12.0–15.0)

## 2017-10-22 MED ORDER — EPOETIN ALFA 10000 UNIT/ML IJ SOLN
INTRAMUSCULAR | Status: AC
  Administered 2017-10-22: 09:00:00 10000 [IU] via SUBCUTANEOUS
  Filled 2017-10-22: qty 1

## 2017-10-22 MED ORDER — EPOETIN ALFA 40000 UNIT/ML IJ SOLN: 30000.0000 [IU] | INTRAMUSCULAR | Status: DC

## 2017-10-22 MED ORDER — EPOETIN ALFA 20000 UNIT/ML IJ SOLN
INTRAMUSCULAR | Status: AC
  Administered 2017-10-22: 20000 [IU] via SUBCUTANEOUS
  Filled 2017-10-22: qty 1

## 2017-11-05 ENCOUNTER — Encounter (HOSPITAL_COMMUNITY)
Admission: RE | Admit: 2017-11-05 | Discharge: 2017-11-05 | Disposition: A | Discharge status: Home / Self Care | Payer: Medicare Other | Source: Ambulatory Visit | Attending: Nephrology | Admitting: Nephrology

## 2017-11-05 VITALS — BP 129/66 | HR 73 | Temp 98.5°F | Resp 20

## 2017-11-05 DIAGNOSIS — N184 Chronic kidney disease, stage 4 (severe): Secondary | ICD-10-CM | POA: Diagnosis not present

## 2017-11-05 LAB — IRON AND TIBC
IRON: 76 ug/dL (ref 28–170)
SATURATION RATIOS: 22 % (ref 10.4–31.8)
TIBC: 347 ug/dL (ref 250–450)
UIBC: 271 ug/dL

## 2017-11-05 LAB — POCT HEMOGLOBIN-HEMACUE: Hemoglobin: 12.4 g/dL (ref 12.0–15.0)

## 2017-11-05 LAB — FERRITIN: Ferritin: 504 ng/mL — ABNORMAL HIGH (ref 11–307)

## 2017-11-05 MED ORDER — EPOETIN ALFA 40000 UNIT/ML IJ SOLN: 30000.0000 [IU] | INTRAMUSCULAR | Status: DC

## 2017-11-19 ENCOUNTER — Encounter (HOSPITAL_COMMUNITY): Payer: Medicare Other

## 2017-11-28 ENCOUNTER — Other Ambulatory Visit: Payer: Self-pay

## 2017-11-28 ENCOUNTER — Emergency Department (HOSPITAL_COMMUNITY)
Admission: EM | Admit: 2017-11-28 | Discharge: 2017-11-28 | Discharge status: Absent without leave | Payer: Medicare Other | Source: Home / Self Care

## 2017-11-28 ENCOUNTER — Inpatient Hospital Stay (HOSPITAL_COMMUNITY)
Admission: AD | Admit: 2017-11-28 | Discharge: 2017-11-30 | DRG: 287 | Disposition: A | Discharge status: Home / Self Care | Payer: Medicare Other | Attending: Cardiology | Admitting: Cardiology

## 2017-11-28 ENCOUNTER — Inpatient Hospital Stay (HOSPITAL_COMMUNITY): Admission: AD | Disposition: A | Discharge status: Home / Self Care | Payer: Self-pay | Attending: Cardiology

## 2017-11-28 ENCOUNTER — Encounter (HOSPITAL_COMMUNITY): Payer: Self-pay | Admitting: Cardiology

## 2017-11-28 DIAGNOSIS — Z8673 Personal history of transient ischemic attack (TIA), and cerebral infarction without residual deficits: Secondary | ICD-10-CM | POA: Diagnosis not present

## 2017-11-28 DIAGNOSIS — I2129 ST elevation (STEMI) myocardial infarction involving other sites: Secondary | ICD-10-CM | POA: Diagnosis present

## 2017-11-28 DIAGNOSIS — K219 Gastro-esophageal reflux disease without esophagitis: Secondary | ICD-10-CM | POA: Diagnosis present

## 2017-11-28 DIAGNOSIS — N184 Chronic kidney disease, stage 4 (severe): Secondary | ICD-10-CM | POA: Diagnosis present

## 2017-11-28 DIAGNOSIS — D638 Anemia in other chronic diseases classified elsewhere: Secondary | ICD-10-CM | POA: Diagnosis present

## 2017-11-28 DIAGNOSIS — Z79899 Other long term (current) drug therapy: Secondary | ICD-10-CM

## 2017-11-28 DIAGNOSIS — Z885 Allergy status to narcotic agent status: Secondary | ICD-10-CM | POA: Diagnosis not present

## 2017-11-28 DIAGNOSIS — I129 Hypertensive chronic kidney disease with stage 1 through stage 4 chronic kidney disease, or unspecified chronic kidney disease: Secondary | ICD-10-CM | POA: Diagnosis present

## 2017-11-28 DIAGNOSIS — E785 Hyperlipidemia, unspecified: Secondary | ICD-10-CM | POA: Diagnosis present

## 2017-11-28 DIAGNOSIS — K5090 Crohn's disease, unspecified, without complications: Secondary | ICD-10-CM | POA: Diagnosis present

## 2017-11-28 DIAGNOSIS — I5181 Takotsubo syndrome: Secondary | ICD-10-CM | POA: Diagnosis present

## 2017-11-28 DIAGNOSIS — I2510 Atherosclerotic heart disease of native coronary artery without angina pectoris: Secondary | ICD-10-CM | POA: Diagnosis present

## 2017-11-28 DIAGNOSIS — Z881 Allergy status to other antibiotic agents status: Secondary | ICD-10-CM

## 2017-11-28 DIAGNOSIS — Z91010 Allergy to peanuts: Secondary | ICD-10-CM

## 2017-11-28 DIAGNOSIS — R0789 Other chest pain: Secondary | ICD-10-CM | POA: Diagnosis present

## 2017-11-28 DIAGNOSIS — Z9049 Acquired absence of other specified parts of digestive tract: Secondary | ICD-10-CM | POA: Diagnosis not present

## 2017-11-28 HISTORY — DX: Personal history of urinary calculi: Z87.442

## 2017-11-28 HISTORY — PX: LEFT HEART CATH AND CORONARY ANGIOGRAPHY: CATH118249

## 2017-11-28 HISTORY — DX: ST elevation (STEMI) myocardial infarction involving other sites: I21.29

## 2017-11-28 HISTORY — DX: Transient cerebral ischemic attack, unspecified: G45.9

## 2017-11-28 HISTORY — PX: CARDIAC CATHETERIZATION: SHX172

## 2017-11-28 LAB — POCT I-STAT, CHEM 8
BUN: 33 mg/dL — AB (ref 6–20)
CALCIUM ION: 1.32 mmol/L (ref 1.15–1.40)
CHLORIDE: 111 mmol/L (ref 101–111)
Creatinine, Ser: 1.2 mg/dL — ABNORMAL HIGH (ref 0.44–1.00)
Glucose, Bld: 150 mg/dL — ABNORMAL HIGH (ref 65–99)
HCT: 33 % — ABNORMAL LOW (ref 36.0–46.0)
Hemoglobin: 11.2 g/dL — ABNORMAL LOW (ref 12.0–15.0)
POTASSIUM: 3.1 mmol/L — AB (ref 3.5–5.1)
SODIUM: 144 mmol/L (ref 135–145)
TCO2: 20 mmol/L — ABNORMAL LOW (ref 22–32)

## 2017-11-28 LAB — CBC WITH DIFFERENTIAL/PLATELET
BASOS ABS: 0 10*3/uL (ref 0.0–0.1)
Basophils Relative: 0 %
EOS ABS: 0 10*3/uL (ref 0.0–0.7)
Eosinophils Relative: 0 %
HCT: 34.9 % — ABNORMAL LOW (ref 36.0–46.0)
HEMOGLOBIN: 11.4 g/dL — AB (ref 12.0–15.0)
LYMPHS ABS: 1.1 10*3/uL (ref 0.7–4.0)
LYMPHS PCT: 9 %
MCH: 31 pg (ref 26.0–34.0)
MCHC: 32.7 g/dL (ref 30.0–36.0)
MCV: 94.8 fL (ref 78.0–100.0)
Monocytes Absolute: 0.4 10*3/uL (ref 0.1–1.0)
Monocytes Relative: 4 %
NEUTROS PCT: 87 %
Neutro Abs: 10.7 10*3/uL — ABNORMAL HIGH (ref 1.7–7.7)
Platelets: 108 10*3/uL — ABNORMAL LOW (ref 150–400)
RBC: 3.68 MIL/uL — AB (ref 3.87–5.11)
RDW: 18 % — ABNORMAL HIGH (ref 11.5–15.5)
WBC: 12.3 10*3/uL — AB (ref 4.0–10.5)

## 2017-11-28 LAB — BASIC METABOLIC PANEL
Anion gap: 17 — ABNORMAL HIGH (ref 5–15)
BUN: 37 mg/dL — ABNORMAL HIGH (ref 6–20)
CO2: 16 mmol/L — AB (ref 22–32)
Calcium: 9.5 mg/dL (ref 8.9–10.3)
Chloride: 113 mmol/L — ABNORMAL HIGH (ref 101–111)
Creatinine, Ser: 1.38 mg/dL — ABNORMAL HIGH (ref 0.44–1.00)
GFR calc Af Amer: 41 mL/min — ABNORMAL LOW (ref >60)
GFR, EST NON AFRICAN AMERICAN: 35 mL/min — AB (ref >60)
GLUCOSE: 73 mg/dL (ref 65–99)
POTASSIUM: 3.6 mmol/L (ref 3.5–5.1)
Sodium: 146 mmol/L — ABNORMAL HIGH (ref 135–145)

## 2017-11-28 LAB — HEMOGLOBIN A1C
HEMOGLOBIN A1C: 5.7 % — AB (ref 4.8–5.6)
Mean Plasma Glucose: 116.89 mg/dL

## 2017-11-28 LAB — LIPID PANEL
CHOLESTEROL: 152 mg/dL (ref 0–200)
HDL: 69 mg/dL (ref >40)
LDL Cholesterol: 70 mg/dL (ref 0–99)
TRIGLYCERIDES: 66 mg/dL (ref <150)
Total CHOL/HDL Ratio: 2.2 RATIO
VLDL: 13 mg/dL (ref 0–40)

## 2017-11-28 LAB — CBC
HEMATOCRIT: 32.6 % — AB (ref 36.0–46.0)
HEMOGLOBIN: 10.6 g/dL — AB (ref 12.0–15.0)
MCH: 30.5 pg (ref 26.0–34.0)
MCHC: 32.5 g/dL (ref 30.0–36.0)
MCV: 93.9 fL (ref 78.0–100.0)
Platelets: 147 10*3/uL — ABNORMAL LOW (ref 150–400)
RBC: 3.47 MIL/uL — AB (ref 3.87–5.11)
RDW: 17.8 % — ABNORMAL HIGH (ref 11.5–15.5)
WBC: 11.5 10*3/uL — ABNORMAL HIGH (ref 4.0–10.5)

## 2017-11-28 LAB — TROPONIN I
TROPONIN I: 0.45 ng/mL — AB (ref <0.03)
TROPONIN I: 7.48 ng/mL — AB (ref <0.03)
Troponin I: 3.61 ng/mL (ref <0.03)

## 2017-11-28 LAB — COMPREHENSIVE METABOLIC PANEL
ALK PHOS: 40 U/L (ref 38–126)
ALT: 24 U/L (ref 14–54)
ANION GAP: 12 (ref 5–15)
AST: 33 U/L (ref 15–41)
Albumin: 3.3 g/dL — ABNORMAL LOW (ref 3.5–5.0)
BILIRUBIN TOTAL: 2 mg/dL — AB (ref 0.3–1.2)
BUN: 37 mg/dL — ABNORMAL HIGH (ref 6–20)
CALCIUM: 9.2 mg/dL (ref 8.9–10.3)
CO2: 17 mmol/L — ABNORMAL LOW (ref 22–32)
Chloride: 113 mmol/L — ABNORMAL HIGH (ref 101–111)
Creatinine, Ser: 1.38 mg/dL — ABNORMAL HIGH (ref 0.44–1.00)
GFR, EST AFRICAN AMERICAN: 41 mL/min — AB (ref >60)
GFR, EST NON AFRICAN AMERICAN: 35 mL/min — AB (ref >60)
Glucose, Bld: 150 mg/dL — ABNORMAL HIGH (ref 65–99)
Potassium: 3.1 mmol/L — ABNORMAL LOW (ref 3.5–5.1)
Sodium: 142 mmol/L (ref 135–145)
TOTAL PROTEIN: 5.8 g/dL — AB (ref 6.5–8.1)

## 2017-11-28 LAB — POCT ACTIVATED CLOTTING TIME: ACTIVATED CLOTTING TIME: 153 s

## 2017-11-28 LAB — PROTIME-INR
INR: 1.27
PROTHROMBIN TIME: 15.8 s — AB (ref 11.4–15.2)

## 2017-11-28 LAB — APTT: aPTT: 177 seconds (ref 24–36)

## 2017-11-28 SURGERY — LEFT HEART CATH AND CORONARY ANGIOGRAPHY
Anesthesia: LOCAL

## 2017-11-28 MED ORDER — ONDANSETRON HCL 4 MG/2ML IJ SOLN
4.0000 mg | Freq: Four times a day (QID) | INTRAMUSCULAR | Status: DC | PRN
  Administered 2017-11-29 – 2017-11-30 (×2): 4 mg via INTRAVENOUS
  Filled 2017-11-28 (×2): qty 2

## 2017-11-28 MED ORDER — HEART ATTACK BOUNCING BOOK
Freq: Once | Status: AC
  Administered 2017-11-28
  Filled 2017-11-28: qty 1

## 2017-11-28 MED ORDER — SODIUM CHLORIDE 0.9% FLUSH: 3.0000 mL | INTRAVENOUS | Status: DC | PRN

## 2017-11-28 MED ORDER — LIDOCAINE HCL (PF) 1 % IJ SOLN
INTRAMUSCULAR | Status: DC | PRN
  Administered 2017-11-28: 16 mL

## 2017-11-28 MED ORDER — SODIUM CHLORIDE 0.9 % IV SOLN
INTRAVENOUS | Status: AC
  Administered 2017-11-28 (×2): via INTRAVENOUS

## 2017-11-28 MED ORDER — NITROGLYCERIN 0.4 MG SL SUBL: 0.4000 mg | SUBLINGUAL_TABLET | SUBLINGUAL | Status: DC | PRN

## 2017-11-28 MED ORDER — IOPAMIDOL (ISOVUE-370) INJECTION 76%
INTRAVENOUS | Status: DC | PRN
  Administered 2017-11-28: 78 mL via INTRA_ARTERIAL

## 2017-11-28 MED ORDER — SODIUM CHLORIDE 0.9% FLUSH
3.0000 mL | Freq: Two times a day (BID) | INTRAVENOUS | Status: DC
  Administered 2017-11-30: 3 mL via INTRAVENOUS

## 2017-11-28 MED ORDER — SODIUM CHLORIDE 0.9 % IV SOLN: 250.0000 mL | INTRAVENOUS | Status: DC | PRN

## 2017-11-28 MED ORDER — SODIUM CHLORIDE 0.9 % IV SOLN
INTRAVENOUS | Status: AC | PRN
  Administered 2017-11-28: 250 mL via INTRAVENOUS

## 2017-11-28 MED ORDER — CLOPIDOGREL BISULFATE 75 MG PO TABS
300.0000 mg | ORAL_TABLET | Freq: Once | ORAL | Status: AC
  Administered 2017-11-28: 300 mg via ORAL
  Filled 2017-11-28: qty 4

## 2017-11-28 MED ORDER — CARVEDILOL 3.125 MG PO TABS
3.1250 mg | ORAL_TABLET | Freq: Two times a day (BID) | ORAL | Status: DC
  Administered 2017-11-28 – 2017-11-30 (×4): 3.125 mg via ORAL
  Filled 2017-11-28 (×4): qty 1

## 2017-11-28 MED ORDER — CLOPIDOGREL BISULFATE 75 MG PO TABS
75.0000 mg | ORAL_TABLET | Freq: Every day | ORAL | Status: DC
  Administered 2017-11-29 – 2017-11-30 (×2): 75 mg via ORAL
  Filled 2017-11-28 (×2): qty 1

## 2017-11-28 MED ORDER — ASPIRIN 325 MG PO TABS: ORAL_TABLET | ORAL | Status: DC | PRN

## 2017-11-28 MED ORDER — ASPIRIN 300 MG RE SUPP: 300.0000 mg | RECTAL | Status: AC

## 2017-11-28 MED ORDER — ASPIRIN 81 MG PO CHEW: 324.0000 mg | CHEWABLE_TABLET | ORAL | Status: AC

## 2017-11-28 MED ORDER — ASPIRIN 81 MG PO CHEW
CHEWABLE_TABLET | ORAL | Status: DC | PRN
  Administered 2017-11-28: 324 mg via ORAL

## 2017-11-28 MED ORDER — RAMIPRIL 2.5 MG PO CAPS
2.5000 mg | ORAL_CAPSULE | Freq: Every day | ORAL | Status: DC
  Administered 2017-11-28 – 2017-11-30 (×3): 2.5 mg via ORAL
  Filled 2017-11-28 (×3): qty 1

## 2017-11-28 MED ORDER — HEPARIN SODIUM (PORCINE) 1000 UNIT/ML IJ SOLN
INTRAMUSCULAR | Status: DC | PRN
  Administered 2017-11-28: 3000 [IU] via INTRAVENOUS

## 2017-11-28 MED ORDER — HEPARIN (PORCINE) IN NACL 2-0.9 UNIT/ML-% IJ SOLN
INTRAMUSCULAR | Status: AC | PRN
  Administered 2017-11-28 (×2): 500 mL

## 2017-11-28 MED ORDER — ATORVASTATIN CALCIUM 40 MG PO TABS
40.0000 mg | ORAL_TABLET | Freq: Every day | ORAL | Status: DC
  Administered 2017-11-28 – 2017-11-29 (×2): 40 mg via ORAL
  Filled 2017-11-28 (×2): qty 1

## 2017-11-28 MED ORDER — ASPIRIN EC 81 MG PO TBEC
81.0000 mg | DELAYED_RELEASE_TABLET | Freq: Every day | ORAL | Status: DC
Start: 2017-11-29 — End: 2017-11-30
  Administered 2017-11-29 – 2017-11-30 (×2): 81 mg via ORAL
  Filled 2017-11-28 (×2): qty 1

## 2017-11-28 MED ORDER — ACETAMINOPHEN 325 MG PO TABS: 650.0000 mg | ORAL_TABLET | ORAL | Status: DC | PRN

## 2017-11-28 SURGICAL SUPPLY — 10 items
CATH INFINITI 5FR MULTPACK ANG (CATHETERS) ×2 IMPLANT
ELECT DEFIB PAD ADLT CADENCE (PAD) ×2 IMPLANT
KIT ENCORE 26 ADVANTAGE (KITS) ×2 IMPLANT
KIT HEART LEFT (KITS) ×2 IMPLANT
PACK CARDIAC CATHETERIZATION (CUSTOM PROCEDURE TRAY) ×2 IMPLANT
SHEATH PINNACLE 6F 10CM (SHEATH) ×2 IMPLANT
SYR MEDRAD MARK V 150ML (SYRINGE) ×2 IMPLANT
TRANSDUCER W/STOPCOCK (MISCELLANEOUS) ×2 IMPLANT
TUBING CIL FLEX 10 FLL-RA (TUBING) ×2 IMPLANT
WIRE EMERALD 3MM-J .035X150CM (WIRE) ×2 IMPLANT

## 2017-11-28 NOTE — Progress Notes (Signed)
CRITICAL VALUE ALERT  Critical value received:  PTT [id number]  Date of notification:  11/28/17  Time of notification:  1300  Critical value read back:Yes.    Nurse who received alert:  Chales Salmon  MD notified (1st page):  Harwani  Time of first page:  1400  MD notified (2nd page):  Time of second page:  Responding MD:  Terrence Dupont  Time MD responded:  5430

## 2017-11-28 NOTE — H&P (Signed)
[name] is an 83 y.o. female.   Chief Complaint: Vague retrosternal chest discomfort associated with generalized weakness diaphoresis HPI: Patient is 83 year old female with past medical history significant for hypertension, hyperlipidemia, history of syncope in the past, history of TIA in the past, Crohn's disease, GERD, came to the ER by EMS as patient complained of vague chest discomfort associated with feeling generalized weakness and diaphoresis EKG done on the field showed normal sinus rhythm with minimal ST elevation in high lateral leads RSR pattern in V1 with poor R-wave progression in V1 to V3 and left axis deviation. Code STEMI was called patient was brought emergently to the Cath Lab. Patient presently denies any chest pain but states feels very weak and tired.   Past Medical History:  Diagnosis Date  . Crohn disease ([place])   . Crohn's disease ([place])   . Hypertension     Past Surgical History:  Procedure Laterality Date  . BOWEL RESECTION    . COLECTOMY      No family history on file. Social History:  reports that  has never smoked. she has never used smokeless tobacco. She reports that she does not drink alcohol or use drugs.  Allergies:  Allergies  Allergen Reactions  . Sulfa Antibiotics Anaphylaxis and Rash  . Codeine Nausea And Vomiting  . Peanut-Containing Drug Products     Medications Prior to Admission  Medication Sig Dispense Refill  . amLODipine (NORVASC) 5 MG tablet Take 5 mg by mouth daily.    . ARIPiprazole (ABILIFY) 5 MG tablet Take 1 tablet (5 mg total) by mouth daily. 30 tablet 0  . hydroxypropyl methylcellulose (ISOPTO TEARS) 2.5 % ophthalmic solution Place 1 drop into both eyes 4 (four) times daily as needed for dry eyes.    Marland Kitchen QUEtiapine (SEROQUEL) 25 MG tablet Take 0.5 tablets (12.5 mg total) by mouth 3 (three) times daily. 90 tablet 0    No results found for this or any previous visit (from the past 48 hour(s)). No results found.  Review of  Systems  Unable to perform ROS: Acuity of condition    Blood pressure 108/68, pulse 69, resp. rate 20, SpO2 100 %. Physical Exam  Constitutional: She is oriented to person, place, and time.  HENT:  Head: Normocephalic and atraumatic.  Eyes: Conjunctivae are normal. Left eye exhibits no discharge. No scleral icterus.  Neck: Normal range of motion. Neck supple. No tracheal deviation present. No thyromegaly present.  Cardiovascular: Normal rate and regular rhythm.  Murmur (Soft systolic murmur noted) heard. Respiratory:  Clear to auscultation anteriorly  GI: Soft. Bowel sounds are normal. She exhibits no distension. There is no tenderness. There is no rebound.  Musculoskeletal: She exhibits no edema, tenderness or deformity.  Neurological: She is alert and oriented to person, place, and time.     Assessment/Plan Possible acute high lateral myocardial infarction Hypertension Hyperlipidemia History of TIA in the past GERD History of syncope in the past workup negative History of Crohn's disease Plan Discussed with patient briefly regarding emergency left cardiac catheterization possible PTCA stenting its risk and benefits and consents for PCI.   Charolette Forward, MD 11/28/2017, 12:24 PM

## 2017-11-28 NOTE — Progress Notes (Signed)
Dr Terrence Dupont notified of troponin 3.61. No new orders. Susie Cassette RN

## 2017-11-28 NOTE — Progress Notes (Signed)
CRITICAL VALUE ALERT  Critical value received:  Troponin 0.45  Date of notification:  11/28/17  Time of notification:  1300  Critical value read back:Yes.    Nurse who received alert:  Chales Salmon   MD notified (1st page):  Harwani   Time of first page:  1400  MD notified (2nd page):  Time of second page:  Responding MD:  Terrence Dupont  Time MD responded:  1282

## 2017-11-29 LAB — CBC
HCT: 31.8 % — ABNORMAL LOW (ref 36.0–46.0)
Hemoglobin: 10.7 g/dL — ABNORMAL LOW (ref 12.0–15.0)
MCH: 31.4 pg (ref 26.0–34.0)
MCHC: 33.6 g/dL (ref 30.0–36.0)
MCV: 93.3 fL (ref 78.0–100.0)
Platelets: 125 10*3/uL — ABNORMAL LOW (ref 150–400)
RBC: 3.41 MIL/uL — ABNORMAL LOW (ref 3.87–5.11)
RDW: 18.3 % — ABNORMAL HIGH (ref 11.5–15.5)
WBC: 9.4 10*3/uL (ref 4.0–10.5)

## 2017-11-29 LAB — BASIC METABOLIC PANEL
Anion gap: 11 (ref 5–15)
BUN: 33 mg/dL — ABNORMAL HIGH (ref 6–20)
CO2: 19 mmol/L — ABNORMAL LOW (ref 22–32)
CREATININE: 1.21 mg/dL — AB (ref 0.44–1.00)
Calcium: 9 mg/dL (ref 8.9–10.3)
Chloride: 112 mmol/L — ABNORMAL HIGH (ref 101–111)
GFR calc Af Amer: 48 mL/min — ABNORMAL LOW (ref >60)
GFR, EST NON AFRICAN AMERICAN: 41 mL/min — AB (ref >60)
GLUCOSE: 99 mg/dL (ref 65–99)
Potassium: 3.4 mmol/L — ABNORMAL LOW (ref 3.5–5.1)
SODIUM: 142 mmol/L (ref 135–145)

## 2017-11-29 LAB — LIPID PANEL
CHOL/HDL RATIO: 2.3 ratio
Cholesterol: 172 mg/dL (ref 0–200)
HDL: 74 mg/dL (ref >40)
LDL CALC: 79 mg/dL (ref 0–99)
TRIGLYCERIDES: 97 mg/dL (ref <150)
VLDL: 19 mg/dL (ref 0–40)

## 2017-11-29 LAB — TROPONIN I: Troponin I: 6.15 ng/mL (ref <0.03)

## 2017-11-29 MED ORDER — POTASSIUM CHLORIDE CRYS ER 20 MEQ PO TBCR
40.0000 meq | EXTENDED_RELEASE_TABLET | Freq: Once | ORAL | Status: AC
  Administered 2017-11-29: 40 meq via ORAL
  Filled 2017-11-29: qty 2

## 2017-11-29 MED FILL — Nitroglycerin IV Soln 100 MCG/ML in D5W: INTRA_ARTERIAL | Qty: 10 | Status: AC

## 2017-11-29 MED FILL — Heparin Sodium (Porcine) 2 Unit/ML in Sodium Chloride 0.9%: INTRAMUSCULAR | Qty: 1000 | Status: AC

## 2017-11-29 NOTE — Progress Notes (Signed)
CARDIAC REHAB PHASE I   PRE:  Rate/Rhythm: 88 SR    BP: sitting 114/79    SaO2: 100 2L, 97 RA  MODE:  Ambulation: 300 ft   POST:  Rate/Rhythm: 111 ST    BP: sitting 138/62     SaO2: 92 RA  Pt initially weak with short stride but with distance she was able to increase her stride and tolerate walk well. We used assist x2 with gait belt however were not actually supporting her. She was somewhat SOB upon sitting in recliner, passed after a few minutes. Discussed Takotsubo/MI, restrictions, low sodium, ex gl, and CRPII. Pt voiced understanding and requests her referral be sent to [facility]. On chair alarm in room.  [place], ACSM 11/29/2017 9:03 AM

## 2017-11-29 NOTE — Progress Notes (Signed)
Subjective:  Patient denies any chest pain states overall feels better. Cardiac enzymes trending down  Objective:  Vital Signs in the last 24 hours: Temp:  [97.3 F (36.3 C)-98.4 F (36.9 C)] 98.4 F (36.9 C) (02/15 1204) Pulse Rate:  [59-86] 82 (02/15 1204) Resp:  [12-31] 23 (02/15 1204) BP: (100-151)/(53-91) 103/72 (02/15 1204) SpO2:  [90 %-100 %] 97 % (02/15 1204) Weight:  [45.3 kg (99 lb 13.9 oz)] 45.3 kg (99 lb 13.9 oz) (02/15 0613)  Intake/Output from previous day: 02/14 0701 - 02/15 0700 In: 965 [P.O.:540; I.V.:425] Out: 901 [Urine:900; Stool:1] Intake/Output from this shift: No intake/output data recorded.  Physical Exam: Neck: no adenopathy, no carotid bruit, no JVD and supple, symmetrical, trachea midline Lungs: decreased breath sound at bases with faint rales Heart: regular rate and rhythm, S1, S2 normal and soft systolic murmur and S3 gallop noted Abdomen: soft, non-tender; bowel sounds normal; no masses,  no organomegaly Extremities: extremities normal, atraumatic, no cyanosis or edema and right groin stable  Lab Results: Recent Labs    11/28/17 1403 11/29/17 0049  WBC 12.3* 9.4  HGB 11.4* 10.7*  PLT 108* 125*   Recent Labs    11/28/17 1403 11/29/17 0049  NA 146* 142  K 3.6 3.4*  CL 113* 112*  CO2 16* 19*  GLUCOSE 73 99  BUN 37* 33*  CREATININE 1.38* 1.21*   Recent Labs    11/28/17 1856 11/29/17 0049  TROPONINI 7.48* 6.15*   Hepatic Function Panel Recent Labs    11/28/17 1205  PROT 5.8*  ALBUMIN 3.3*  AST 33  ALT 24  ALKPHOS 40  BILITOT 2.0*   Recent Labs    11/29/17 0049  CHOL 172   No results for input(s): PROTIME in the last 72 hours.  Imaging: Imaging results have been reviewed and No results found.  Cardiac Studies:  Assessment/Plan:  Takotsabu  Cardiomyopathy Hypertension Hyperlipidemia History of TIA in the past GERD Chronic kidney disease Anemia of chronic disease History of syncope in the past workup  negative History of processes Plan As per orders We will uptitrate beta blockers and Ace inhibitors as blood pressure tolerates Increase ambulation Check labs in a.m. Possible discharge in a.m. If stable  LOS: 1 day    Charolette Forward 11/29/2017, 12:10 PM

## 2017-11-29 NOTE — Progress Notes (Signed)
Patient showing more awareness, better compliance.  Safety sitter DC'd and tele sitter instituted.

## 2017-11-29 NOTE — Progress Notes (Signed)
Patient with periods of confusion as to place.  Poor safety awareness.  Safety sitter placed in room.

## 2017-11-30 LAB — BASIC METABOLIC PANEL
Anion gap: 10 (ref 5–15)
BUN: 24 mg/dL — ABNORMAL HIGH (ref 6–20)
CHLORIDE: 113 mmol/L — AB (ref 101–111)
CO2: 18 mmol/L — AB (ref 22–32)
Calcium: 8.8 mg/dL — ABNORMAL LOW (ref 8.9–10.3)
Creatinine, Ser: 1.1 mg/dL — ABNORMAL HIGH (ref 0.44–1.00)
GFR calc Af Amer: 53 mL/min — ABNORMAL LOW (ref >60)
GFR calc non Af Amer: 46 mL/min — ABNORMAL LOW (ref >60)
Glucose, Bld: 101 mg/dL — ABNORMAL HIGH (ref 65–99)
POTASSIUM: 4.3 mmol/L (ref 3.5–5.1)
SODIUM: 141 mmol/L (ref 135–145)

## 2017-11-30 LAB — CBC
HCT: 30.6 % — ABNORMAL LOW (ref 36.0–46.0)
Hemoglobin: 10.1 g/dL — ABNORMAL LOW (ref 12.0–15.0)
MCH: 31 pg (ref 26.0–34.0)
MCHC: 33 g/dL (ref 30.0–36.0)
MCV: 93.9 fL (ref 78.0–100.0)
PLATELETS: 109 10*3/uL — AB (ref 150–400)
RBC: 3.26 MIL/uL — ABNORMAL LOW (ref 3.87–5.11)
RDW: 18.7 % — AB (ref 11.5–15.5)
WBC: 8.9 10*3/uL (ref 4.0–10.5)

## 2017-11-30 LAB — TROPONIN I: TROPONIN I: 1.51 ng/mL — AB (ref <0.03)

## 2017-11-30 MED ORDER — NITROGLYCERIN 0.4 MG SL SUBL: 0.4000 mg | SUBLINGUAL_TABLET | SUBLINGUAL | 12 refills | Status: DC | PRN

## 2017-11-30 MED ORDER — ATORVASTATIN CALCIUM 40 MG PO TABS: 40.0000 mg | ORAL_TABLET | Freq: Every day | ORAL | 3 refills | Status: AC

## 2017-11-30 MED ORDER — ASPIRIN 81 MG PO TBEC: 81.0000 mg | DELAYED_RELEASE_TABLET | Freq: Every day | ORAL | 3 refills | Status: DC

## 2017-11-30 MED ORDER — PANTOPRAZOLE SODIUM 20 MG PO TBEC: 20.0000 mg | DELAYED_RELEASE_TABLET | Freq: Every day | ORAL | 1 refills | Status: DC

## 2017-11-30 MED ORDER — CARVEDILOL 3.125 MG PO TABS: 3.1250 mg | ORAL_TABLET | Freq: Two times a day (BID) | ORAL | 3 refills | Status: DC

## 2017-11-30 MED ORDER — CLOPIDOGREL BISULFATE 75 MG PO TABS: 75.0000 mg | ORAL_TABLET | Freq: Every day | ORAL | 3 refills | Status: AC

## 2017-11-30 MED ORDER — RAMIPRIL 5 MG PO CAPS: 5.0000 mg | ORAL_CAPSULE | Freq: Every day | ORAL | 3 refills | Status: DC

## 2017-11-30 NOTE — Discharge Summary (Signed)
[name], [name]                ACCOUNT NO.:  [id number]  MEDICAL RECORD NO.:  [id number]  LOCATION:                                 FACILITY:  PHYSICIAN:  Zaia Carre N. Terrence Dupont, M.D. DATE OF BIRTH:  [birth year]  DATE OF ADMISSION:  11/28/2017 DATE OF DISCHARGE:  11/30/2017                              DISCHARGE SUMMARY   ADMITTING DIAGNOSES: 1. Possible acute high lateral myocardial infarction. 2. Hypertension. 3. Hyperlipidemia. 4. History of transient ischemic attack in the past. 5. Gastroesophageal reflux disease. 6. History of syncope in the past, workup negative. 7. History of Crohn disease. 8. History of paranoid delusions in the past.  DISCHARGE DIAGNOSES: 1. Takotsubo cardiomyopathy. 2. Mildly elevated troponin I secondary to above. 3. Hypertension. 4. Hyperlipidemia. 5. Coronary artery disease. 6. History of transient ischemic attack in the past. 7. Gastroesophageal reflux disease. 8. History of syncope in the past, workup negative. 9. History of Crohn disease. 10.History of paranoid delusions in the past. 11.Chronic kidney disease, stable. 12.Anemia of chronic disease, stable.  DISCHARGE HOME MEDICATIONS: 1. Aspirin 81 mg 1 tab daily. 2. Atorvastatin 40 mg daily. 3. Carvedilol 3.125 mg twice daily. 4. Clopidogrel 75 mg daily. 5. Nitrostat sublingual p.r.n. 6. Protonix 20 mg daily. 7. Ramipril 5 mg daily. 8. Multivitamin 1 capsule daily. 9. Eye drops as before, stopped. 10.Amlodipine. 11.Abilify. 12.Seroquel for now.  FOLLOWUP:  Followup with me in 1 week.  DISCHARGE INSTRUCTIONS:  Postcardiac cath instructions have been given.  CONDITION AT DISCHARGE:  Stable.  BRIEF HISTORY AND HOSPITAL COURSE:  [name] is an 83 year old female with past medical history significant for hypertension, hyperlipidemia, history of syncope in the past, history of TIA in the past, Crohn disease, GERD, history of paranoid delusions in the past. She came to the ER  by EMS as patient complained of vague chest discomfort associated with feeling generalized weakness and diaphoresis. EKG done on the field showed normal sinus rhythm with minimal ST elevation in high lateral leads, RSR pattern in V1 and with poor R-wave progression in V1 to V3 and left axis deviation.  Code STEMI was called. The patient was brought emergently to the cath lab.  The patient presently denies any chest pain, but states feels very weak and tired.  PHYSICAL EXAMINATION:  GENERAL:  She was alert, awake, oriented. VITAL SIGNS:  Blood pressure was 108/68, pulse 69. EYES:  Conjunctivae were pink. NECK:  Supple.  No JVD.  No bruit. LUNGS:  Clear to auscultation anterolaterally. CARDIOVASCULAR:  S1, S2 was normal.  There was soft systolic murmur. ABDOMEN:  Soft.  Bowel sounds are present.  Nontender. EXTREMITIES:  There is no clubbing, cyanosis, or edema. NEURO:  Grossly intact.  LABORATORY DATA:  Sodium was 144, potassium 3.1, glucose was 150, repeat fasting blood sugar is 101, BUN was 33, creatinine 1.20, and troponin I first set was 0.45, 3.61, 7.48, 6.15, which is trending down, today is 1.51.  Cholesterol was 172, HDL 74, LDL 79.  Hemoglobin was 10.6, hematocrit 32.6, white count of 11.5.  Last hemoglobin is 10.1, hematocrit 30.6, white count of 8.9, which has been stable.  BRIEF HOSPITAL COURSE:  The patient was  directly brought to the cath lab and underwent left cardiac catheterization.  As per procedure report, the patient was noted to have critical stenosis in the ostial diagonal 1 which was a small vessel, but more significantly the patient was noted to have apical ballooning and severe anterolateral apical wall hypokinesia suggestive of takotsubo cardiomyopathy.  The patient did not have any episodes of chest pain during the hospital stay.  The patient was started on ACE inhibitors and beta blockers with improvement in her symptoms.  Her groin is stable with no  evidence of hematoma or bruit. The patient is ambulating in room and hallway without any problems.  The patient will be discharged home on above medications and will be followed up in my office in 1 week.     Allegra Lai. Terrence Dupont, M.D.     MNH/MEDQ  D:  11/30/2017  T:  11/30/2017  Job:  [id number]

## 2017-11-30 NOTE — Progress Notes (Signed)
CARDIAC REHAB PHASE I   PRE:  Rate/Rhythm: Sinus 81  BP:  Supine: 118/72       SaO2: 93% Room Air  MODE:  Ambulation: 700 ft   POST:  Rate/Rhythem: 90  BP:    Sitting: 125/66     SaO2: 97% Room Air  [phone]  Patient walked in hallway with assistance times 2.using the gait belt. [name] is not as confused this morning. [name]'s gait was steady and required little assistance during the walk. Tolerated well, increased distance. Patient assisted back to bed with call bell within reach.  Harrell Gave RN BSN.

## 2017-11-30 NOTE — Discharge Instructions (Signed)
Acute Coronary Syndrome Acute coronary syndrome (ACS) is a serious problem in which there is suddenly not enough blood and oxygen reaching the heart. ACS can result in chest pain or a heart attack. What are the causes? This condition may be caused by:  A buildup of fat and cholesterol inside of the arteries (atherosclerosis). This is the most common cause. The buildup (plaque) can cause the blood vessels in your heart (coronary arteries) to become narrow or blocked. Plaque can also break off to form a clot.  A coronary spasm.  A tearing of the coronary artery (spontaneous coronary artery dissection).  Low blood pressure (hypotension).  An abnormal heart beat (arrhythmia).  Using cocaine or methamphetamine.  What increases the risk? The following factors may make you more likely to develop this condition:  Age.  History of chest pain, heart attack, or stroke.  Being female.  Family history of chest pain, heart disease, or stroke.  Smoking.  Inactivity.  Being overweight.  High cholesterol.  High blood pressure (hypertension).  Diabetes.  Excessive alcohol use.  What are the signs or symptoms? Common symptoms of this condition include:  Chest pain. The pain may last long, or may stop and come back (recur). It may feel like: ? Crushing or squeezing. ? Tightness, pressure, fullness, or heaviness.  Arm, neck, jaw, or back pain.  Heartburn or indigestion.  Shortness of breath.  Nausea.  Sudden cold sweats.  Lightheadedness.  Dizziness.  Tiredness (fatigue).  Sometimes there are no symptoms. How is this diagnosed? This condition may be diagnosed through:  An electrocardiogram (ECG). This test records the impulses of the heart.  Blood tests.  A CT scan of the chest.  A coronary angiogram. This procedure checks for a blockage in the coronary arteries.  How is this treated? Treatment for this condition may include:  Oxygen.  Medicines, such  as: ? Antiplatelet medicines and blood-thinning medicines, such as aspirin. These help prevent blood clots. ? Fibrinolytic therapy. This breaks apart a blood clot. ? Blood pressure medicines. ? Nitroglycerin. ? Pain medicine. ? Cholesterol medicine.  A procedure called coronary angioplasty and stenting. This is done to widen a narrowed artery and keep it open.  Coronary artery bypass surgery. This allows blood to pass the blockage to reach your heart.  Cardiac rehabilitation. This is a program that helps improve your health and well-being. It includes exercise training, education, and counseling to help you recover.  Follow these instructions at home: Eating and drinking  Follow a heart-healthy, low-salt (sodium) diet.  Use healthy cooking methods such as roasting, grilling, broiling, baking, poaching, steaming, or stir-frying.  Talk to a dietitian to learn about healthy cooking methods and how to eat less sodium. Medicines  Take over-the-counter and prescription medicines only as told by your health care provider.  Do not take these medicines unless your health care provider approves: ? Nonsteroidal anti-inflammatory drugs (NSAIDs), such as ibuprofen, naproxen, or celecoxib. ? Vitamin supplements that contain vitamin A or vitamin E. ? Hormone replacement therapy that contains estrogen. Activity  Join a cardiac rehabilitation program.  Ask your health care provider: ? What activities and exercises are safe for you. ? If you should follow specific instructions about lifting, driving, or climbing stairs.  If you are taking aspirin and another blood thinning medicine, avoid activities that are likely to result in an injury. The medicines can increase your risk of bleeding. Lifestyle  Do not use any products that contain nicotine or tobacco, such as cigarettes  and e-cigarettes. If you need help quitting, ask your health care provider.  If you drink alcohol and your health care  provider says it is okay to drink, limit your alcohol intake to no more than 1 drink per day. One drink equals 12 oz of beer, 5 oz of wine, or 1 oz of hard liquor.  Maintain a healthy weight. If you need to lose weight, do it in a way that has been approved by your health care provider. General instructions  Tell all your health care providers about your heart condition, including your dentist. Some medicines can increase your risk of arrhythmia.  Manage other health conditions, such as hypertension and diabetes. These conditions affect your heart.  Learn ways to manage stress.  Get screened for depression, and seek treatment if needed.  Monitor your blood pressure if told by your health care provider.  Keep your vaccinations up to date. Get the annual influenza vaccine.  Keep all follow-up visits as told by your health care provider. This is important. Contact a health care provider if:  You feel overwhelmed or sad.  You have trouble with your daily activities. Get help right away if:  You have pain in your chest, neck, arm, jaw, stomach, or back that recurs, and: ? Lasts more than a few minutes. ? Is not relieved by taking the [facility] health care provider prescribed.  You have unexplained: ? Heavy sweating. ? Heartburn or indigestion. ? Shortness of breath. ? Difficulty breathing. ? Nausea or vomiting. ? Fatigue. ? Nervousness or anxiety. ? Weakness. ? Diarrhea. ? Dark stools or blood in the stool.  You have sudden lightheadedness or dizziness.  Your blood pressure is higher than 180/120  You faint.  You feel like hurting yourself or think about taking your own life. These symptoms may represent a serious problem that is an emergency. Do not wait to see if the symptoms will go away. Get medical help right away. Call your local emergency services (911 in the [place] Do not drive yourself to the clinic or hospital. Summary  Acute coronary syndrome (ACS) is a  when there is not enough blood and oxygen being supplied to the heart. ACS can result in chest pain or a heart attack.  Acute coronary syndrome is a medical emergency. If you have any symptoms of this condition, get help right away.  Treatment includes oxygen, medicines, and procedures to open the blocked arteries and restore blood flow. This information is not intended to replace advice given to you by your health care provider. Make sure you discuss any questions you have with your health care provider. Document Released: 10/01/2005 Document Revised: 11/02/2016 Document Reviewed: 11/02/2016 Elsevier Interactive Patient Education  2018 [place] An angiogram is an X-ray test. It is used to look at your blood vessels. For this test, a dye is put into the blood vessel being checked. The dye shows up on X-rays. It helps your doctor see if there is a blockage or other problem in the blood vessel. What happens before the procedure?  Follow your doctor's instructions about limiting what you eat or drink.  Ask your doctor if you may drink enough water to take any needed medicines the morning of the test.  Plan to have someone take you home after the test.  If you go home the same day as the test, plan to have someone stay with you for 24 hours. What happens during the procedure?  An IV tube will  be put into one of your veins.  You will be given a medicine that makes you relax (sedative).  Your skin will be washed where the thin tube (catheter) will be put in. Hair may be removed from this area. The tube may be put into: ? Your upper leg area (groin). ? The fold of your arm, near your elbow. ? Your wrist.  You will be given a medicine that numbs the area where the tube will be inserted (local anesthetic).  The tube will be inserted into a blood vessel.  Using a type of X-ray (fluoroscopy) to see, your doctor will move the tube into the blood vessel to check it.  Dye  will be put in through the tube. X-rays of your blood vessels will then be taken. Different doctors and hospitals may do this procedure differently. What happens after the procedure?  If the test is done through the leg, you will be kept in bed lying flat for several hours. You will be told to not bend or cross your legs.  The area where the tube was inserted will be checked often.  The pulse in your feet or wrist will be checked often.  More tests or X-rays may be done. This information is not intended to replace advice given to you by your health care provider. Make sure you discuss any questions you have with your health care provider. Document Released: 12/28/2008 Document Revised: 03/08/2016 Document Reviewed: 03/04/2013 Elsevier Interactive Patient Education  2017 [employer].

## 2017-11-30 NOTE — Discharge Summary (Signed)
Discharge summary dictated on 11/30/2017 dictation number is [phone]

## 2017-12-02 ENCOUNTER — Telehealth (HOSPITAL_COMMUNITY): Payer: Self-pay

## 2017-12-02 ENCOUNTER — Encounter (HOSPITAL_COMMUNITY): Payer: Medicare Other

## 2017-12-02 NOTE — Telephone Encounter (Signed)
Called patient to see if interested in the CR program - patient is interested. Explained our scheduling process and that she needs to make an appt with Dr.Harwani before we could move forward. Patient stated she understands. Paperwork is in follow up appt folder.

## 2017-12-02 NOTE — Telephone Encounter (Signed)
Patients insurance is active and benefits verified through Medicare Part A & B - No co-pay, deductible amount of $185.00/$0.00 has been met, no out of pocket, 20% co-insurance, and no pre-authorization is required. Passport/reference [id number]  Patients insurance is active and benefits verified through [facility] - No co-pay, deductible amount of $350.00/$0.00 has been met, out of pocket amount of $5,000/$6.11 has been met, 15% co-insurance, and no pre-authorization is required. Passport/reference [id number]  Will call patient to see if interested in CR - if patient is interested patient will need to attend follow up appt with the Cardiologist office. Once appt is completed patient will be contacted for scheduling upon review by the RN Navigator.

## 2017-12-04 ENCOUNTER — Encounter (HOSPITAL_COMMUNITY): Payer: Self-pay

## 2017-12-04 ENCOUNTER — Emergency Department (HOSPITAL_COMMUNITY)
Admission: EM | Admit: 2017-12-04 | Discharge: 2017-12-05 | Disposition: A | Discharge status: Home / Self Care | Payer: Medicare Other | Attending: Emergency Medicine | Admitting: Emergency Medicine

## 2017-12-04 DIAGNOSIS — Z7982 Long term (current) use of aspirin: Secondary | ICD-10-CM | POA: Diagnosis not present

## 2017-12-04 DIAGNOSIS — F0391 Unspecified dementia with behavioral disturbance: Secondary | ICD-10-CM | POA: Diagnosis not present

## 2017-12-04 DIAGNOSIS — N184 Chronic kidney disease, stage 4 (severe): Secondary | ICD-10-CM | POA: Diagnosis not present

## 2017-12-04 DIAGNOSIS — Z79899 Other long term (current) drug therapy: Secondary | ICD-10-CM | POA: Insufficient documentation

## 2017-12-04 DIAGNOSIS — F4329 Adjustment disorder with other symptoms: Secondary | ICD-10-CM

## 2017-12-04 DIAGNOSIS — I129 Hypertensive chronic kidney disease with stage 1 through stage 4 chronic kidney disease, or unspecified chronic kidney disease: Secondary | ICD-10-CM | POA: Insufficient documentation

## 2017-12-04 DIAGNOSIS — R4182 Altered mental status, unspecified: Secondary | ICD-10-CM | POA: Diagnosis present

## 2017-12-04 DIAGNOSIS — I252 Old myocardial infarction: Secondary | ICD-10-CM | POA: Insufficient documentation

## 2017-12-04 DIAGNOSIS — F29 Unspecified psychosis not due to a substance or known physiological condition: Secondary | ICD-10-CM | POA: Diagnosis present

## 2017-12-04 LAB — CBC WITH DIFFERENTIAL/PLATELET
BASOS ABS: 0 10*3/uL (ref 0.0–0.1)
BASOS PCT: 0 %
Eosinophils Absolute: 0.2 10*3/uL (ref 0.0–0.7)
Eosinophils Relative: 2 %
HEMATOCRIT: 31.4 % — AB (ref 36.0–46.0)
HEMOGLOBIN: 10.3 g/dL — AB (ref 12.0–15.0)
LYMPHS PCT: 22 %
Lymphs Abs: 1.5 10*3/uL (ref 0.7–4.0)
MCH: 31.5 pg (ref 26.0–34.0)
MCHC: 32.8 g/dL (ref 30.0–36.0)
MCV: 96 fL (ref 78.0–100.0)
Monocytes Absolute: 0.4 10*3/uL (ref 0.1–1.0)
Monocytes Relative: 6 %
NEUTROS ABS: 4.9 10*3/uL (ref 1.7–7.7)
NEUTROS PCT: 70 %
Platelets: 170 10*3/uL (ref 150–400)
RBC: 3.27 MIL/uL — AB (ref 3.87–5.11)
RDW: 17.9 % — ABNORMAL HIGH (ref 11.5–15.5)
WBC: 7 10*3/uL (ref 4.0–10.5)

## 2017-12-04 LAB — COMPREHENSIVE METABOLIC PANEL
ALBUMIN: 3.4 g/dL — AB (ref 3.5–5.0)
ALK PHOS: 47 U/L (ref 38–126)
ALT: 53 U/L (ref 14–54)
ANION GAP: 10 (ref 5–15)
AST: 61 U/L — ABNORMAL HIGH (ref 15–41)
BILIRUBIN TOTAL: 0.8 mg/dL (ref 0.3–1.2)
BUN: 27 mg/dL — ABNORMAL HIGH (ref 6–20)
CO2: 21 mmol/L — AB (ref 22–32)
CREATININE: 1.21 mg/dL — AB (ref 0.44–1.00)
Calcium: 9.5 mg/dL (ref 8.9–10.3)
Chloride: 112 mmol/L — ABNORMAL HIGH (ref 101–111)
GFR calc Af Amer: 48 mL/min — ABNORMAL LOW (ref >60)
GFR calc non Af Amer: 41 mL/min — ABNORMAL LOW (ref >60)
GLUCOSE: 100 mg/dL — AB (ref 65–99)
Potassium: 4.1 mmol/L (ref 3.5–5.1)
SODIUM: 143 mmol/L (ref 135–145)
TOTAL PROTEIN: 6.4 g/dL — AB (ref 6.5–8.1)

## 2017-12-04 LAB — URINALYSIS, ROUTINE W REFLEX MICROSCOPIC
BILIRUBIN URINE: NEGATIVE
GLUCOSE, UA: NEGATIVE mg/dL
Ketones, ur: NEGATIVE mg/dL
LEUKOCYTES UA: NEGATIVE
NITRITE: NEGATIVE
PROTEIN: NEGATIVE mg/dL
SQUAMOUS EPITHELIAL / LPF: NONE SEEN
Specific Gravity, Urine: 1.018 (ref 1.005–1.030)
pH: 5 (ref 5.0–8.0)

## 2017-12-04 LAB — ETHANOL: Alcohol, Ethyl (B): <10 mg/dL (ref <10)

## 2017-12-04 LAB — RAPID URINE DRUG SCREEN, HOSP PERFORMED
Amphetamines: NOT DETECTED
BARBITURATES: NOT DETECTED
Benzodiazepines: NOT DETECTED
Cocaine: NOT DETECTED
Opiates: NOT DETECTED
TETRAHYDROCANNABINOL: NOT DETECTED

## 2017-12-04 MED ORDER — QUETIAPINE FUMARATE 25 MG PO TABS
12.5000 mg | ORAL_TABLET | Freq: Three times a day (TID) | ORAL | Status: DC
  Administered 2017-12-04 – 2017-12-05 (×2): 12.5 mg via ORAL
  Filled 2017-12-04 (×2): qty 1

## 2017-12-04 MED ORDER — ARIPIPRAZOLE 5 MG PO TABS
5.0000 mg | ORAL_TABLET | Freq: Every day | ORAL | Status: DC
  Administered 2017-12-04 – 2017-12-05 (×2): 5 mg via ORAL
  Filled 2017-12-04 (×2): qty 1

## 2017-12-04 NOTE — ED Notes (Signed)
Bed: EH87 Expected date:  Expected time:  Means of arrival:  Comments: 83 yo dementia off meds agitated

## 2017-12-04 NOTE — ED Triage Notes (Signed)
Patient BIB EMS from home for episode of altered mental status. Patient's family called EMS after patient became agitated and did not recognize who they were. Patient has Hx of dementia but is alert and oriented except during episodes. After recent cardiac event, cardiologist was said to have taken her off her medications; Abilify and Seroquel.

## 2017-12-04 NOTE — ED Provider Notes (Signed)
[facility] DEPT Provider Note   CSN: [id number] Arrival date & time: 12/04/17  2029     History   Chief Complaint Chief Complaint  Patient presents with  . Altered Mental Status    Hx of dementia    HPI [name] is a 83 y.o. female.  HPI Patient was recently in the hospital for possible acute myocardial infarction.  Patient was diagnosed with Taksubo cardiomyopathy.  Patient was discharged home back on the 16th.  According to the EMS report family states that she was taken off of her Abilify and Seroquel.  The discharge summary however indicates that the patient continue those medications.  According to the EMS report the patient became agitated and did not recognize her family.  They called EMS.  Patient denies any complaints here in the emergency room.  She states there was some confusion she was just standing up for other people's rights. Past Medical History:  Diagnosis Date  . Acute lateral wall myocardial infarction ([place]) 11/28/2017  . Crohn disease ([place])   . History of kidney stones   . Hypertension   . TIA (transient ischemic attack) 01/2014   hx/notes 02/06/2014    Patient Active Problem List   Diagnosis Date Noted  . Acute MI, lateral wall ([place]) 11/28/2017  . Psychosis in elderly with behavioral disturbance 08/30/2017  . Paranoid delusion ([place]) 06/26/2017  . CKD (chronic kidney disease) stage 4, GFR 15-29 ml/min (HCC) 01/31/2017  . Palpitations 08/29/2016  . TIA (transient ischemic attack) 02/02/2014    Past Surgical History:  Procedure Laterality Date  . CARDIAC CATHETERIZATION  11/28/2017  . COLECTOMY    . DILATION AND CURETTAGE OF UTERUS    . ESOPHAGOGASTRODUODENOSCOPY  10/2008   Archie Endo 02/14/2011  . LAPAROSCOPIC RIGHT HEMI COLECTOMY  1970s   Archie Endo 5/2/012  . LEFT HEART CATH AND CORONARY ANGIOGRAPHY N/A 11/28/2017   Procedure: LEFT HEART CATH AND CORONARY ANGIOGRAPHY;  Surgeon: Charolette Forward, MD;  Location: [facility];  Service: Cardiovascular;  Laterality: N/A;  . TONSILLECTOMY      OB History    No data available       Home Medications    Prior to Admission medications   Medication Sig Start Date End Date Taking? Authorizing Provider  aspirin EC 81 MG EC tablet Take 1 tablet (81 mg total) by mouth daily. 12/01/17  Yes Charolette Forward, MD  atorvastatin (LIPITOR) 40 MG tablet Take 1 tablet (40 mg total) by mouth daily at 6 PM. 11/30/17  Yes Charolette Forward, MD  carvedilol (COREG) 3.125 MG tablet Take 1 tablet (3.125 mg total) by mouth 2 (two) times daily with a meal. 11/30/17  Yes Charolette Forward, MD  clopidogrel (PLAVIX) 75 MG tablet Take 1 tablet (75 mg total) by mouth daily. 12/01/17  Yes Charolette Forward, MD  hydroxypropyl methylcellulose (ISOPTO TEARS) 2.5 % ophthalmic solution Place 1 drop into both eyes 4 (four) times daily as needed for dry eyes.   Yes [provider]  nitroGLYCERIN (NITROSTAT) 0.4 MG SL tablet Place 1 tablet (0.4 mg total) under the tongue every 5 (five) minutes x 3 doses as needed for chest pain. 11/30/17  Yes Charolette Forward, MD  pantoprazole (PROTONIX) 20 MG tablet Take 1 tablet (20 mg total) by mouth daily. 11/30/17 11/30/18 Yes Charolette Forward, MD  ramipril (ALTACE) 5 MG capsule Take 1 capsule (5 mg total) by mouth daily. 12/01/17  Yes Charolette Forward, MD    Family History History reviewed.  No pertinent family history.  Social History Social History   Tobacco Use  . Smoking status: Never Smoker  . Smokeless tobacco: Never Used  Substance Use Topics  . Alcohol use: No  . Drug use: No     Allergies   Sulfa antibiotics; Codeine; and Peanut-containing drug products   Review of Systems Review of Systems  All other systems reviewed and are negative.    Physical Exam Updated Vital Signs BP 130/73 (BP Location: Left Arm)   Pulse 80   Temp 98.1 F (36.7 C) (Oral)   Resp 18   SpO2 100%   Physical Exam  Constitutional: No distress.  Elderly,  frail  HENT:  Head: Normocephalic and atraumatic.  Right Ear: External ear normal.  Left Ear: External ear normal.  Eyes: Conjunctivae are normal. Right eye exhibits no discharge. Left eye exhibits no discharge. No scleral icterus.  Neck: Neck supple. No tracheal deviation present.  Cardiovascular: Normal rate, regular rhythm and intact distal pulses.  Pulmonary/Chest: Effort normal and breath sounds normal. No stridor. No respiratory distress. She has no wheezes. She has no rales.  Abdominal: Soft. Bowel sounds are normal. She exhibits no distension. There is no tenderness. There is no rebound and no guarding.  Musculoskeletal: She exhibits no edema or tenderness.  Neurological: She is alert. She has normal strength. No cranial nerve deficit (no facial droop, extraocular movements intact, no slurred speech) or sensory deficit. She exhibits normal muscle tone. She displays no seizure activity. Coordination normal.  Skin: Skin is warm and dry. No rash noted.  Psychiatric: She has a normal mood and affect. Her affect is not angry. She is not agitated and not aggressive. Cognition and memory are impaired. She does not exhibit a depressed mood. She expresses no homicidal and no suicidal ideation.  Nursing note and vitals reviewed.    ED Treatments / Results  Labs (all labs ordered are listed, but only abnormal results are displayed) Labs Reviewed  COMPREHENSIVE METABOLIC PANEL - Abnormal; Notable for the following components:      Result Value   Chloride 112 (*)    CO2 21 (*)    Glucose, Bld 100 (*)    BUN 27 (*)    Creatinine, Ser 1.21 (*)    Total Protein 6.4 (*)    Albumin 3.4 (*)    AST 61 (*)    GFR calc non Af Amer 41 (*)    GFR calc Af Amer 48 (*)    All other components within normal limits  CBC WITH DIFFERENTIAL/PLATELET - Abnormal; Notable for the following components:   RBC 3.27 (*)    Hemoglobin 10.3 (*)    HCT 31.4 (*)    RDW 17.9 (*)    All other components within  normal limits  ETHANOL  RAPID URINE DRUG SCREEN, HOSP PERFORMED  URINALYSIS, ROUTINE W REFLEX MICROSCOPIC   Procedures Procedures (including critical care time)  Medications Ordered in ED Medications  QUEtiapine (SEROQUEL) tablet 12.5 mg (12.5 mg Oral Given 12/04/17 2140)  ARIPiprazole (ABILIFY) tablet 5 mg (5 mg Oral Given 12/04/17 2141)     Initial Impression / Assessment and Plan / ED Course  I have reviewed the triage vital signs and the nursing notes.  Pertinent labs & imaging results that were available during my care of the patient were reviewed by me and considered in my medical decision making (see chart for details).  Clinical Course as of Dec 05 2331  Wed Dec 04, 2017  2244  Pt's husband and son arrived in the ED.   They explained that she became very agitated this evening and did not recognize them.  This has happened in the past when she has had this psychotic episodes.  Family states that pt was very lucid when she was in the hospital and pt told her cardiologist she did not need to continue her psych meds.   Will plan on medical clearance and psych evaluation.  [JK]    Clinical Course User Index [JK] Dorie Rank, MD    Patient presented to the emergency room for agitation and behavioral disturbance.  Patient is calm while she is in the emergency room.  Patient however does not remember her family members and is confused.  Patient had stopped taking her psychiatric medications after her recent cardiac event.  I suspect this is contributing to her confusion and agitation.  I will consult with psychiatry.  She is medically stable.  She is not have any trouble with chest pain or shortness of breath.  I have ordered her home medications.  We will continue to monitor.  Final Clinical Impressions(s) / ED Diagnoses   Final diagnoses:  Dementia with behavioral disturbance, unspecified dementia type      Dorie Rank, MD 12/04/17 2333

## 2017-12-05 DIAGNOSIS — F4329 Adjustment disorder with other symptoms: Secondary | ICD-10-CM

## 2017-12-05 MED ORDER — QUETIAPINE FUMARATE 25 MG PO TABS: 12.5000 mg | ORAL_TABLET | Freq: Three times a day (TID) | ORAL | 0 refills | Status: DC

## 2017-12-05 MED ORDER — ARIPIPRAZOLE 5 MG PO TABS: 5.0000 mg | ORAL_TABLET | Freq: Every day | ORAL | 0 refills | Status: DC

## 2017-12-05 NOTE — ED Notes (Signed)
Called pt son and informed him mother is up for discharge. Reports that her husband will be picking her up and they live about 30-40 mins away, so my be close to noon before he gets here.

## 2017-12-05 NOTE — BHH Suicide Risk Assessment (Signed)
Suicide Risk Assessment  Discharge Assessment   [facility] Discharge Suicide Risk Assessment   Principal Problem: Adjustment disorder with emotional disturbance Discharge Diagnoses:  Patient Active Problem List   Diagnosis Date Noted  . Adjustment disorder with emotional disturbance [F43.29] 12/05/2017    Priority: High  . Acute MI, lateral wall ([place]) [I21.29] 11/28/2017  . Paranoid delusion ([place]) [F22] 06/26/2017  . CKD (chronic kidney disease) stage 4, GFR 15-29 ml/min (HCC) [N18.4] 01/31/2017  . Palpitations [R00.2] 08/29/2016  . TIA (transient ischemic attack) [G45.9] 02/02/2014    Total Time spent with patient: 45 minutes  Musculoskeletal: Strength & Muscle Tone: within normal limits Gait & Station: normal Patient leans: N/A  Psychiatric Specialty Exam:   Blood pressure (!) 108/59, pulse 78, temperature 98.1 F (36.7 C), temperature source Oral, resp. rate 16, SpO2 99 %.There is no height or weight on file to calculate BMI.  General Appearance: Casual  Eye Contact::  Good  Speech:  Normal Rate409  Volume:  Normal  Mood:  Euthymic  Affect:  Congruent  Thought Process:  Coherent and Descriptions of Associations: Intact  Orientation:  Full (Time, Place, and Person)  Thought Content:  WDL and Logical  Suicidal Thoughts:  No  Homicidal Thoughts:  No  Memory:  Immediate;   Good Recent;   Good Remote;   Good  Judgement:  Fair  Insight:  Good  Psychomotor Activity:  Normal  Concentration:  Good  Recall:  Good  Fund of Knowledge:Good  Language: Good  Akathisia:  No  Handed:  Right  AIMS (if indicated):     Assets:  Leisure Time Physical Health Resilience Social Support  Sleep:     Cognition: WNL  ADL's:  Intact   Mental Status Per Nursing Assessment::   On Admission:   83 yo female presenting with agitation initially but very calm, cooperative, and polite.  She thought she was supposed to stop taking her medications due to recent chest pain.  Medications restarted  in ED, stable.  No suicidal/homicidal ideations, hallucinations, agitation, or substance abuse.  Stable for discharge.  Demographic Factors:  Age 2 or older  Loss Factors: NA  Historical Factors: NA  Risk Reduction Factors:   Sense of responsibility to family, Positive social support and Positive therapeutic relationship  Continued Clinical Symptoms:  None Cognitive Features That Contribute To Risk:  None    Suicide Risk:  Minimal: No identifiable suicidal ideation.  Patients presenting with no risk factors but with morbid ruminations; may be classified as minimal risk based on the severity of the depressive symptoms    Plan Of Care/Follow-up recommendations:  Activity:  as tolerated Diet:  heart healthy diet  Davette Nugent, NP 12/05/2017, 10:33 AM

## 2017-12-05 NOTE — Discharge Instructions (Signed)
For your behavioral health needs, you are advised to keep your previously scheduled appointment with your current outpatient provider.

## 2017-12-05 NOTE — ED Notes (Signed)
Per SON, patient is scheduled to see neuro-psych at [facility] on 12/12/17.

## 2017-12-05 NOTE — ED Notes (Signed)
[name], son, [phone].

## 2017-12-05 NOTE — BH Assessment (Addendum)
Assessment Note  [name] is an 83 y.o. female, who presents voluntary and unaccompanied to [facility]. Clinician asked the pt, "what brought you to the hospital?" Pt reported, she did not know. Clinician expressed to the pt, per her chart the reason for her hospital visit is because she became agitated and did not recognize family members. Pt reported, that was not true. Pt reported, "I will do something to the people who sent me here." Pt reported, "yesterday somebody in the group needed treatment." Pt reported, "I felt so sorry for them." Pt denies, SI, HI, AVH, self-injurious behaviors and access to weapons.   Pt denies abuse and substance use. Pt's UDS is negative. Pt denies, being linked to OPT resources (medication management and/or counseling.) Per chart, pt has a previous inpatient admission at [facility].  Pt presents alert with logical/coherent speech. Pt's eye contact was good. Pt's mood was pleasant. Pt affect was congruent with mood. Pt's thought process was relevant/coherent. Pt's judgement was impaired. Pt was oriented x2. Pt's concentration was fair. Pt's insight was poor. Pt's impulse control was fair. Pt reported, if discharged from [facility] she could contract for safety. Pt reported, if inpatient treatment is explained and recommended she will sign-in voluntarily.   Diagnosis: Dementia.  Past Medical History:  Past Medical History:  Diagnosis Date  . Acute lateral wall myocardial infarction ([place]) 11/28/2017  . Crohn disease ([place])   . History of kidney stones   . Hypertension   . TIA (transient ischemic attack) 01/2014   hx/notes 02/06/2014    Past Surgical History:  Procedure Laterality Date  . CARDIAC CATHETERIZATION  11/28/2017  . COLECTOMY    . DILATION AND CURETTAGE OF UTERUS    . ESOPHAGOGASTRODUODENOSCOPY  10/2008   Archie Endo 02/14/2011  . LAPAROSCOPIC RIGHT HEMI COLECTOMY  1970s   Archie Endo 5/2/012  . LEFT HEART CATH AND CORONARY ANGIOGRAPHY N/A 11/28/2017    Procedure: LEFT HEART CATH AND CORONARY ANGIOGRAPHY;  Surgeon: Charolette Forward, MD;  Location: [facility];  Service: Cardiovascular;  Laterality: N/A;  . TONSILLECTOMY      Family History: History reviewed. No pertinent family history.  Social History:  reports that  has never smoked. she has never used smokeless tobacco. She reports that she does not drink alcohol or use drugs.  Additional Social History:  Alcohol / Drug Use Pain Medications: See MAR Prescriptions: See MAR Over the Counter: See MAR History of alcohol / drug use?: No history of alcohol / drug abuse(Pt denies, UDS is negative.)  CIWA: CIWA-Ar BP: 130/73 Pulse Rate: 80 COWS:    Allergies:  Allergies  Allergen Reactions  . Sulfa Antibiotics Anaphylaxis and Rash  . Codeine Nausea And Vomiting  . Peanut-Containing Drug Products     Home Medications:  (Not in a hospital admission)  OB/GYN Status:  No LMP recorded. Patient is postmenopausal.  General Assessment Data Location of Assessment: [facility] ED TTS Assessment: In system Is this a Tele or Face-to-Face Assessment?: Face-to-Face Is this an Initial Assessment or a Re-assessment for this encounter?: Initial Assessment Marital status: Married [place] name: Freda Munro Is patient pregnant?: No Pregnancy Status: No Living Arrangements: Spouse/significant other Can pt return to current living arrangement?: Yes Admission Status: Voluntary Is patient capable of signing voluntary admission?: Yes Referral Source: Self/Family/Friend Insurance type: Medicare.     Crisis Care Plan Living Arrangements: Spouse/significant other Legal Guardian: Other:(Self. ) Name of Psychiatrist: NA Name of Therapist: NA  Education Status Is patient currently in school?: No Current  Grade: NA Highest grade of school patient has completed: Master's Degree. Name of school: [employer]. Contact person: NA  Risk to self with the past 6 months Suicidal Ideation: No(Pt denies.  ) Has patient been a risk to self within the past 6 months prior to admission? : No Suicidal Intent: No Has patient had any suicidal intent within the past 6 months prior to admission? : No Is patient at risk for suicide?: No Suicidal Plan?: No Has patient had any suicidal plan within the past 6 months prior to admission? : No Access to Means: No What has been your use of drugs/alcohol within the last 12 months?: Pt denies. UDS is negative. Previous Attempts/Gestures: No How many times?: 0 Other Self Harm Risks: Pt denies.  Triggers for Past Attempts: None known Intentional Self Injurious Behavior: None(Pt denies. ) Family Suicide History: No Recent stressful life event(s): Other (Comment)(Pt reporting not knowing why she is at the hospital. ) Persecutory voices/beliefs?: No(Pt denies. ) Depression: No(Pt denies.) Depression Symptoms: (Pt denies.) Substance abuse history and/or treatment for substance abuse?: No Suicide prevention information given to non-admitted patients: Not applicable  Risk to Others within the past 6 months Homicidal Ideation: (Pt denies.) Does patient have any lifetime risk of violence toward others beyond the six months prior to admission? : No Thoughts of Harm to Others: No Current Homicidal Intent: No Current Homicidal Plan: No Access to Homicidal Means: No Identified Victim: NA History of harm to others?: No(Pt denies.) Assessment of Violence: None Noted Violent Behavior Description: NA Does patient have access to weapons?: No(Pt denies. ) Criminal Charges Pending?: No Does patient have a court date: No Is patient on probation?: No  Psychosis Hallucinations: None noted Delusions: Unspecified  Mental Status Report Appearance/Hygiene: Unremarkable Eye Contact: Good Motor Activity: Unremarkable Speech: Logical/coherent Level of Consciousness: Alert Mood: Pleasant Affect: Other (Comment)(congruent with mood. ) Anxiety Level: Minimal Thought  Processes: Relevant, Coherent Judgement: Impaired Orientation: Person, Place Obsessive Compulsive Thoughts/Behaviors: None  Cognitive Functioning Concentration: Fair Memory: Recent Impaired IQ: Average Insight: Poor Impulse Control: Fair Appetite: Fair Sleep: No Change Total Hours of Sleep: 8 Vegetative Symptoms: None  ADLScreening [facility] Assessment Services) Patient's cognitive ability adequate to safely complete daily activities?: Yes Patient able to express need for assistance with ADLs?: Yes Independently performs ADLs?: Yes (appropriate for developmental age)  Prior Inpatient Therapy Prior Inpatient Therapy: Yes Prior Therapy Dates: 2018. Prior Therapy Facilty/Provider(s): [facility] Reason for Treatment: Dementia.   Prior Outpatient Therapy Prior Outpatient Therapy: No Prior Therapy Dates: NA Prior Therapy Facilty/Provider(s): NA Reason for Treatment: NA Does patient have an ACCT team?: No Does patient have Intensive In-House Services?  : No Does patient have [facility] services? : No Does patient have P4CC services?: No  ADL Screening (condition at time of admission) Patient's cognitive ability adequate to safely complete daily activities?: Yes Is the patient deaf or have difficulty hearing?: No Does the patient have difficulty seeing, even when wearing glasses/contacts?: Yes(Pt reported, using reading glasses. ) Does the patient have difficulty concentrating, remembering, or making decisions?: No Patient able to express need for assistance with ADLs?: Yes Does the patient have difficulty dressing or bathing?: No Independently performs ADLs?: Yes (appropriate for developmental age) Does the patient have difficulty walking or climbing stairs?: No Weakness of Legs: None Weakness of Arms/Hands: None  Home Assistive Devices/Equipment Home Assistive Devices/Equipment: None    Abuse/Neglect Assessment (Assessment to be complete while patient is  alone) Abuse/Neglect Assessment Can Be Completed: Yes Physical Abuse:  Denies(Pt denies. ) Verbal Abuse: Denies(Pt denies.) Sexual Abuse: Denies(Pt denies.) Exploitation of patient/patient's resources: Denies(Pt denies.) Self-Neglect: Denies(Pt denies. )     Advance Directives (For Healthcare) Does Patient Have a Medical Advance Directive?: No Would patient like information on creating a medical advance directive?: No - Patient declined    Additional Information 1:1 In Past 12 Months?: No CIRT Risk: No Elopement Risk: No Does patient have medical clearance?: Yes     Disposition: Patriciaann Clan, PA recommends overnight observation for safety and stabilization and to contact collateral. Disposition discussed with Dr. Florina Ou.    Disposition Initial Assessment Completed for this Encounter: Yes Disposition of Patient: Re-evaluation by Psychiatry recommended  On Site Evaluation by:  Alyson Ingles. Cyla Haluska, MS, LPC, CRC.  Reviewed with Physician:  Dr. Florina Ou and Patriciaann Clan, PA.  Vertell Novak 12/05/2017 1:27 AM   Vertell Novak, MS, [facility], [place] Triage Specialist [phone]

## 2017-12-05 NOTE — BHH Counselor (Signed)
Clinician attempted to contact pt's nurse to discuss disposition, however pt's nurse was with another pt. Clinician gave the secretary her number to give to the nurse to call to discuss pt's disposition.  Vertell Novak, MS, [facility], [facility] Triage Specialist [phone]

## 2017-12-05 NOTE — BH Assessment (Signed)
[facility] Assessment Progress Note  Per Buford Dresser, DO, this pt does not require psychiatric hospitalization at this time.  Pt is to be discharged from [facility] with recommendation to follow up with her current outpatient provider.  This has been included in pt's discharge instructions.  Pt's nurse has been notified.  Jalene Mullet, [place] Triage Specialist [phone]

## 2017-12-05 NOTE — BHH Counselor (Signed)
Clinician discussed pt's disposition with Lanelle Bal, RN.   Vertell Novak, MS, [facility], [facility] Triage Specialist [phone]

## 2017-12-12 ENCOUNTER — Ambulatory Visit: Payer: Medicare Other | Admitting: Psychology

## 2017-12-12 ENCOUNTER — Ambulatory Visit (INDEPENDENT_AMBULATORY_CARE_PROVIDER_SITE_OTHER): Payer: Medicare Other | Admitting: Psychology

## 2017-12-12 DIAGNOSIS — F22 Delusional disorders: Secondary | ICD-10-CM

## 2017-12-12 DIAGNOSIS — R413 Other amnesia: Secondary | ICD-10-CM | POA: Diagnosis not present

## 2017-12-12 DIAGNOSIS — F0281 Dementia in other diseases classified elsewhere with behavioral disturbance: Secondary | ICD-10-CM

## 2017-12-12 DIAGNOSIS — G301 Alzheimer's disease with late onset: Principal | ICD-10-CM

## 2017-12-12 NOTE — Progress Notes (Signed)
   Neuropsychology Note  [name] completed 60 minutes of neuropsychological testing with technician, Milana Kidney, BS, under the supervision of Dr. Macarthur Critchley, Licensed Psychologist. The patient did not appear overtly distressed by the testing session, per behavioral observation or via self-report to the technician. Rest breaks were offered.   Clinical Decision Making: In considering the patient's current level of functioning, level of presumed impairment, nature of symptoms, emotional and behavioral responses during the interview, level of literacy, and observed level of motivation/effort, a battery of tests was selected and communicated to the psychometrician.  Communication between the psychologist and technician was ongoing throughout the testing session and changes were made as deemed necessary based on patient performance on testing, technician observations and additional pertinent factors such as those listed above.  [name] will return within approximately 2 weeks for an interactive feedback session with Dr. Si Raider at which time her test performances, clinical impressions and treatment recommendations will be reviewed in detail. The patient understands she can contact our office should she require our assistance before this time.  35 minutes spent performing neuropsychological evaluation services/clinical decision making (psychologist). [CPT 01601] 60 minutes spent face-to-face with patient administering standardized tests, 30 minutes spent scoring (technician). [CPT [id number], 09323]  Full report to follow.

## 2017-12-12 NOTE — Progress Notes (Signed)
NEUROBEHAVIORAL STATUS EXAM   Name: [name] Date of Birth: [birth year] Date of Interview: 12/12/2017  Reason for Referral:  [name] is a 83 y.o. female who is referred for neuropsychological evaluation by [name] due to concerns about psychosis and cognitive impairment. This patient is accompanied in the office by her son, [name], who supplements the history. Her husband also accompanies them in the office.  History of Presenting Problem:  Per the patient's son, the patient demonstrated gradual onset of cognitive and behavioral changes approximately 3-4 years ago. She was frequently misplacing and losing items, initially things of little significance but over time more important items. She would comment that she thought someone might be taking the items when she couldn't find them. Gradually over time this happened more frequently and with agitation and more specific suspicion including thinking that her other son who lives in [place] was taking things from her house. About 2 years ago (summer 2017), her son killed two snakes that were on her property and then a small lizard got into the house. She has always been fearful of snakes/lizards so this was upsetting to her but moreso than in the past. She remained agitated and upset about the lizard for weeks. Over time she started thinking there were multiple lizards in the house, and then she started thinking they were attacking her at night. At one point she even believed they had taken her clothes off in the night. This escalated to thinking that someone had put the lizards in the house or under the house. Her son tried to reassure her and show her that no one had tampered with vents, etc, under the house, but the fear would return the next day. Then she started thinking she saw shadows outside and was concerned there were people outside her house. She started calling the [facility] department multiple times about this. Then she  began having visual hallucinations of seeing animals in the house that were not there. She also started thinking that there were people inside her house. Her paranoia continued increasing to the point that she thought that the heating technician installed listening devices in her home, and she was having visual hallucinations of frightening people in or around her home.   She was seen in the ED on 06/26/2017 for visual hallucinations and paranoid delusions. Screening labs, urine were unremarkable. Head CT did not show anything acute but did note age related atrophy, chronic small vessel ischemia (stable from 2015), and remote left pareto-occipital infarct with encephalomalacia new from exams 3 years prior. Patient and family decided to find psychiatry help on an outpatient basis rather than do voluntary inpatient psychiatric hospitalization.   In November 2018, she called EMS and was taken to the ED for delusions of parasite infestation. She was oriented to person and place but not time, per ED note. Patient was IVC'd during the process of workup as she was attempting to leave and medical staff did not feel she could provide proper care for herself at home. She was transferred to [facility] geriatric behavioral health on 08/31/2017 and remained there for 6 days. She was started on Abilify.   In December 2018, she was seen again in the ED after she did not recognize her husband, did not recognize herself in a photo or the mirror, and thought that a woman was trying to get in bed with her (it was actually her husband). Abilify dose was increased to 5  mg daily and she was prescribed Seroquel 12.5 mg. Inpatient psychiatric hospitalization was not deemed necessary. The patient had had an outpatient initial psychiatry visit with Dr. Casimiro Murillo a few days prior and was scheduled for follow-up in January.   Seroquel helped with sleep (the patient had been up and doing things, confused, in the  middle of the night prior to starting this medication), but she continued to demonstrate confusion. She still would not recognize her husband sometimes, and she didn't recognize her home on one occasion, thinking they were in a university. In January, the patient began demonstrating "mirrored self misidentification" per her son [name]. He explained that she thought that her reflection in the mirror was another person, and she would talk to herself in the mirror. She told [name] that this woman (her reflection) lived next door.   On 11/28/2017, the patient was taken to the ED for chest discomfort and generalized weakness. She was found to have takotsubo cardiomyopathy. She was discharged home on 11/30/2017.   On 12/04/2017, the patient was seen in the ED for agitation and not recognizing family members. She had not taken her Abilify or Seroquel since being discharged from the hospital 4 days prior. She was seen by [facility] and observation was recommended but inpatient hospitalization was not deemed necessary. She was restarted on her medications Abilify and Seroquel.   At today's visit (12/04/2017), the patient's son, [name] (confidentially, separate from his mother), reports that she is doing much better now that she is back on the Abilify and Seroquel. She does however continue to have some paranoid ideation, thinking people are coming into the house at night. She is much less anxious about this when her son is there. He tries to stay the night with them when he is in town. The patient's sons have talked with [facility] about in-home caregiver assistance but nothing has been arranged yet. The patient is not currently driving, but she has been expressing a return to drive more recently. (She tells me today that she does not feel she is a "danger" on the road because she is "pretty careful".) Prior to her recent hospitalizations, she was driving to the grocery store and church. Now her son, cousin and  church members are doing the driving. When she is a passenger in the car, she is not recognizing some familiar locations. Her sons are helping manage her and her husband's medications (fill the pillboxes and set them out when they are not going to be there). All the bills are set on auto-pay so the patient does not have to manage any of that. Her son prefers to prepare their meals or have their meals purchased, but she is able to prepare/reheat some simple things. She makes her appointments but her son tries to help manage those. She was having trouble with housekeeping and organization in the home but with Abilify and Seroquel this has improved and she is "keeping things neat again" per her son. She is bathing independently and regularly.   Her son notes that initial cognitive/behavioral changes, as described previously, were misplacement/loss of items and then paranoid ideation about what happened to the items. However, she did not initially (or currently) present with forgetfulness for recent conversations or events. She did not / does not repeat statements or questions.  Physically, she has some weakness but no falls. She is sleeping well now, with Seroquel. Her appetite is good and she is eating well; she has always been a very small/thin person. She  and her son deny any significant weight loss. She does not drink any alcohol.   She has no prior psychiatric history. Family psychiatric history is reportedly significant for bipolar disorder in a brother who was diagnosed as a teenager. One of her other brothers demonstrated some confusion and possible dementia in his 93s (died at ~81yo). There is no known family history of dementia or elderly-onset psychotic disorder.  The patient reports her mood is stable but she does get anxious at times. She reports that she is concerned about her husband's condition and his declining memory. She also expresses a desire to return to driving.   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 2 adult sons. [name] lives about 20 minutes away and sees them regularly when he is working locally, although he does travel [place] for work regularly. Other son lives in [place] and does come help when he can. Alcohol: None, never a drinker Tobacco: None, never a smoker   Medical History: Past Medical History:  Diagnosis Date  . Acute lateral wall myocardial infarction ([place]) 11/28/2017  . Crohn disease ([place])   . History of kidney stones   . Hypertension   . TIA (transient ischemic attack) 01/2014   hx/notes 02/06/2014     Current Medications:  Outpatient Encounter Medications as of 12/12/2017  Medication Sig  . ARIPiprazole (ABILIFY) 5 MG tablet Take 1 tablet (5 mg total) by mouth daily.  Marland Kitchen aspirin EC 81 MG EC tablet Take 1 tablet (81 mg total) by mouth daily.  Marland Kitchen atorvastatin (LIPITOR) 40 MG tablet Take 1 tablet (40 mg total) by mouth daily at 6 PM.  . carvedilol (COREG) 3.125 MG tablet Take 1 tablet (3.125 mg total) by mouth 2 (two) times daily with a meal.  . clopidogrel (PLAVIX) 75 MG tablet Take 1 tablet (75 mg total) by mouth daily.  . hydroxypropyl methylcellulose (ISOPTO TEARS) 2.5 % ophthalmic solution Place 1 drop into both eyes 4 (four) times daily as needed for dry eyes.  . nitroGLYCERIN (NITROSTAT) 0.4 MG SL tablet Place 1 tablet (0.4 mg total) under the tongue every 5 (five) minutes x 3 doses as needed for chest pain.  . pantoprazole (PROTONIX) 20 MG tablet Take 1 tablet (20 mg total) by mouth daily.  . QUEtiapine (SEROQUEL) 25 MG tablet Take 0.5 tablets (12.5 mg total) by mouth 3 (three) times daily.  . ramipril (ALTACE) 5 MG capsule Take 1 capsule (5 mg total) by mouth daily.   No facility-administered encounter medications on file as of 12/12/2017.      Behavioral Observations:    Appearance: Appropriately dressed and groomed, very thin appearing (per patient and family this is her baseline) Gait: Ambulated independently, no gross abnormalities observed Speech: Fluent; normal rate, rhythm and volume.  Thought process: Somewhat nonlinear at times Affect: Full, anxious Interpersonal: Pleasant, appropriate   90 minutes spent face-to-face with patient/family completing neurobehavioral status exam. 95 minutes spent integrating medical records/clinical data and completing this report. CPT codes: [phone]; [id number] units.   TESTING: There is medical necessity to proceed with neuropsychological assessment as the results will be used to aid in differential diagnosis and clinical decision-making and to inform specific treatment recommendations. Per the patient's son and medical records reviewed, there has been a change in cognitive functioning and a reasonable suspicion of neurodegenerative dementia.  Clinical Decision Making: In considering the patient's current level of functioning,  level of presumed impairment, nature of symptoms, emotional and behavioral responses during the interview, level of literacy, and observed level of motivation, a battery of tests was selected and communicated to the psychometrician.   Following the clinical interview/neurobehavioral status exam, the patient completed this full battery of neuropsychological testing with my psychometrician under my supervision (see separate note).   PLAN: The patient/family will return to see me for a follow-up session at which time her test performances and my impressions and treatment recommendations will be reviewed in detail.  Evaluation ongoing; full report to follow.

## 2017-12-13 ENCOUNTER — Encounter: Payer: Self-pay | Admitting: Psychology

## 2018-01-16 NOTE — Progress Notes (Signed)
NEUROPSYCHOLOGICAL EVALUATION   Name:    [name]  Date of Birth:   10-May-1935 Date of Interview:  12/12/2017 Date of Testing:  12/12/2017   Date of Feedback:  01/21/2018       Background Information:  Reason for Referral:  [name] is a 83 y.o. female referred by [name] to assess her current level of cognitive functioning and assist in differential diagnosis. The current evaluation consisted of a review of available medical records, an interview with the patient and her son, [name], and the completion of a neuropsychological testing battery. Informed consent was obtained.  History of Presenting Problem:  Per the patient's son, the patient demonstrated gradual onset of cognitive and behavioral changes approximately 3-4 years ago. She was frequently misplacing and losing items, initially things of little significance but over time more important items. She would comment that she thought someone might be taking the items when she couldn't find them. Gradually over time this happened more frequently and with agitation and more specific suspicion including thinking that her other son who lives in [place] was taking things from her house. About 2 years ago (summer 2017), her son killed two snakes that were on her property and then a small lizard got into the house. She has always been fearful of snakes/lizards so this was upsetting to her but moreso than in the past. She remained agitated and upset about the lizard for weeks. Over time she started thinking there were multiple lizards in the house, and then she started thinking they were attacking her at night. At one point she even believed they had taken her clothes off in the night. This escalated to thinking that someone had put the lizards in the house or under the house. Her son tried to reassure her and show her that no one had tampered with vents, etc, under the house, but the fear would return the next day. Then she started  thinking she saw shadows outside and was concerned there were people outside her house. She started calling the [facility] department multiple times about this. Then she began having visual hallucinations of seeing animals in the house that were not there. She also started thinking that there were people inside her house. Her paranoia continued increasing to the point that she thought that the heating technician installed listening devices in her home, and she was having visual hallucinations of frightening people in or around her home.   She was seen in the ED on 06/26/2017 for visual hallucinations and paranoid delusions. Screening labs, urine were unremarkable. Head CT did not show anything acute but did note age related atrophy, chronic small vessel ischemia (stable from 2015), and remote left pareto-occipital infarct with encephalomalacia new from exams 3 years prior. Patient and family decided to find psychiatry help on an outpatient basis rather than do voluntary inpatient psychiatric hospitalization.   In November 2018, she called EMS and was taken to the ED for delusions of parasite infestation. She was oriented to person and place but not time, per ED note. Patient was IVC'dduring the process of workup as she was attempting to leave and medical staff did not feel she could provide proper care for herself at home. She was transferred to [facility] geriatric behavioral health on 08/31/2017 and remained there for 6 days. She was started on Abilify.   In December 2018, she was seen again in the ED after she did not recognize her husband,  did not recognize herself in a photo or the mirror, and thought that a woman was trying to get in bed with her (it was actually her husband). Abilify dose was increased to 5 mg daily and she was prescribed Seroquel 12.5 mg. Inpatient psychiatric hospitalization was not deemed necessary. The patient had had an outpatient initial psychiatry visit  with [name] a few days prior and was scheduled for follow-up in January.   Seroquel helped with sleep (the patient had been up and doing things, confused, in the middle of the night prior to starting this medication), but she continued to demonstrate confusion. She still would not recognize her husband sometimes, and she didn't recognize her home on one occasion, thinking they were in a university. In January, the patient began demonstrating "mirrored self misidentification" per her son [name]. He explained that she thought that her reflection in the mirror was another person, and she would talk to herself in the mirror. She told [name] that this woman (her reflection) lived next door.   On 11/28/2017, the patient was taken to the ED for chest discomfort and generalized weakness. She was found to have takotsubo cardiomyopathy. She was discharged home on 11/30/2017.   On 12/04/2017, the patient was seen in the ED for agitation and not recognizing family members. She had not taken her Abilify or Seroquel since being discharged from the hospital 4 days prior. She was seen by [facility] and observation was recommended but inpatient hospitalization was not deemed necessary. She was restarted on her medications Abilify and Seroquel.   At today's visit (12/04/2017), the patient's son, [name] (confidentially, separate from his mother), reports that she is doing much better now that she is back on the Abilify and Seroquel. She does however continue to have some paranoid ideation, thinking people are coming into the house at night. She is much less anxious about this when her son is there. He tries to stay the night with them when he is in town. The patient's sons have talked with [facility] about in-home caregiver assistance but nothing has been arranged yet. The patient is not currently driving, but she has been expressing a return to drive more recently. (She tells me today that she does not feel she  is a "danger" on the road because she is "pretty careful".) Prior to her recent hospitalizations, she was driving to the grocery store and church. Now her son, cousin and church members are doing the driving. When she is a passenger in the car, she is not recognizing some familiar locations. Her sons are helping manage her and her husband's medications (fill the pillboxes and set them out when they are not going to be there). All the bills are set on auto-pay so the patient does not have to manage any of that. Her son prefers to prepare their meals or have their meals purchased, but she is able to prepare/reheat some simple things. She makes her appointments but her son tries to help manage those. She was having trouble with housekeeping and organization in the home but with Abilify and Seroquel this has improved and she is "keeping things neat again" per her son. She is bathing independently and regularly.   Her son notes that initial cognitive/behavioral changes, as described previously, were misplacement/loss of items and then paranoid ideation about what happened to the items. However, she did not initially (or currently) present with forgetfulness for recent conversations or events. She did not / does not repeat statements or questions.  Physically, she has some weakness but no falls. She is sleeping well now, with Seroquel. Her appetite is good and she is eating well; she has always been a very small/thin person. She and her son deny any significant weight loss. She does not drink any alcohol.   She has no prior psychiatric history. Family psychiatric history is reportedly significant for bipolar disorder in a brother who was diagnosed as a teenager. One of her other brothers demonstrated some confusion and possible dementia in his 35s (died at ~81yo). There is no known family history of dementia or elderly-onset psychotic disorder.  The patient reports her mood is stable but she does get anxious  at times. She reports that she is concerned about her husband's condition and his declining memory. She also expresses a desire to return to driving.   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 2 adult sons. [name] lives about 20 minutes away and sees them regularly when he is working locally, although he does travel [place] for work regularly. Other son lives in [place] and does come help when he can. Alcohol: None, never a drinker Tobacco: None, never a smoker   Medical History:  Past Medical History:  Diagnosis Date  . Acute lateral wall myocardial infarction ([place]) 11/28/2017  . Crohn disease ([place])   . History of kidney stones   . Hypertension   . TIA (transient ischemic attack) 01/2014   hx/notes 02/06/2014    Current medications:  Outpatient Encounter Medications as of 01/21/2018  Medication Sig  . ARIPiprazole (ABILIFY) 5 MG tablet Take 1 tablet (5 mg total) by mouth daily.  Marland Kitchen aspirin EC 81 MG EC tablet Take 1 tablet (81 mg total) by mouth daily.  Marland Kitchen atorvastatin (LIPITOR) 40 MG tablet Take 1 tablet (40 mg total) by mouth daily at 6 PM.  . carvedilol (COREG) 3.125 MG tablet Take 1 tablet (3.125 mg total) by mouth 2 (two) times daily with a meal.  . clopidogrel (PLAVIX) 75 MG tablet Take 1 tablet (75 mg total) by mouth daily.  . hydroxypropyl methylcellulose (ISOPTO TEARS) 2.5 % ophthalmic solution Place 1 drop into both eyes 4 (four) times daily as needed for dry eyes.  . nitroGLYCERIN (NITROSTAT) 0.4 MG SL tablet Place 1 tablet (0.4 mg total) under the tongue every 5 (five) minutes x 3 doses as needed for chest pain.  . pantoprazole (PROTONIX) 20 MG tablet Take 1 tablet (20 mg total) by mouth daily.  . QUEtiapine (SEROQUEL) 25 MG tablet Take 0.5 tablets (12.5 mg total) by mouth 3 (three) times daily.  . ramipril (ALTACE) 5 MG capsule  Take 1 capsule (5 mg total) by mouth daily.   No facility-administered encounter medications on file as of 01/21/2018.      Current Examination:  Behavioral Observations:  Appearance: Appropriately dressed and groomed, very thin appearing (per patient and family this is her baseline) Gait: Ambulated independently, no gross abnormalities observed Speech: Fluent; normal rate, rhythm and volume.  Thought process: Somewhat nonlinear at times Affect: Full, anxious Interpersonal: Pleasant, appropriate Orientation: Oriented to person and place. Disoriented to date, month and year. Accurately named the current President but could not name his predecessor.   Tests Administered: . Test of Premorbid Functioning (TOPF) . Dementia Rating Scale-Second Edition (DRS-2) . Wechsler Adult Intelligence Scale-Fourth Edition (WAIS-IV): Coding subtest . [place] Verbal Learning Test - 2nd Edition (CVLT-2) Short Form .  Neuropsychological Assessment Battery (NAB) Language Module, Form 1:  Naming Subtest . Controlled Oral Word Association Test (COWAT) . Trail Making Test A and B . Clock drawing test . Geriatric Depression Scale (GDS) 15 Item . Generalized Anxiety Disorder - 7 item screener (GAD-7)  Test Results: Note: Standardized scores are presented only for use by appropriately trained professionals and to allow for any future test-retest comparison. These scores should not be interpreted without consideration of all the information that is contained in the rest of the report. The most recent standardization samples from the test publisher or other sources were used whenever possible to derive standard scores; scores were corrected for age, gender, ethnicity and education when available.   Test Scores:  Test Name Raw Score Standardized Score Descriptor  TOPF 14/70 SS= 78 Borderline  DRS-2     Attention 37 ss= 14 Superior  Initiation/Perseveration 35 ss= 10 Average  Construction 2 ss= 2 Impaired     Conceptualization 31 ss= 7 Low average  Memory 8 ss= 2 Impaired   Total score 113 ss= 3 Impaired  Total score - Education Adjusted (AEMSS)  ss= 1 Severely Impaired  WAIS-IV Subtest     Coding 8/135 ss= 3 Impaired  CVLT-II Scores     Trial 1 3/9 Z= -2.5 Impaired  Trial 4 4/9 Z= -2 Impaired  Trials 1-4 total 15/36 T= 27 Impaired  SD Free Recall 0/9 Z= -2.5 Impaired  LD Free Recall 0/9 Z= -2.5 Impaired  LD Cued Recall 0/9 Z= -3.5 Severely impaired  Recognition Discriminability 7/9 hits; 8 false positives  Impaired  Forced Choice Recognition 7/9  Severely impaired  NAB Naming 14/31 T= 19 Severely impaired  COWAT-FAS 10 T= 26 Impaired  COWAT-Animals 7 T= 28 Impaired  Trail Making Test A  Pt unable  Severely impaired  Trail Making Test B  Pt unable  Severely impaired  Clock Drawing   Impaired  GDS-15 2/15  WNL  GAD-7 2/21  WNL      Description of Test Results:  The patient was administered a word reading task as this ability is often resilient to many forms of dementia; however, she performed in the borderline range and this is discrepant from her educational and occupational history. Premorbid intellectual abilities were estimated to have been within at least the average range based on education level and occupational history. On a dementia screening measure (DRS-2), her overall performance fell within the severely impaired range, consistent with dementia.   Psychomotor processing speed was impaired. Basic auditory attention was intact. Visual-spatial construction was impaired. Language abilities, specifically semantic retrieval abilities (confrontation naming and semantic verbal fluency), were impaired. On the memory sub-scale of the DRS-2, she performed within the impaired range, demonstrating disorientation to time, as well as impaired immediate and delayed recall of new information. On a more robust measure of verbal memory (CVLT-II-SF), encoding and acquisition of non-contextual  information (i.e., word list) was impaired. After a brief distracter task, free recall was impaired (0/9 items). After a delay, free recall was impaired (0/9 items). Cued recall was severely impaired (0/9 items). Performance on a yes/no recognition task was impaired due to very high number of false positive errors. Performance on a forced choice recognition task also was impaired. Executive functioning was somewhat variable but mostly impaired. Mental flexibility and set-shifting were severely impaired; she was unable to complete both Trails A and Trails B. Verbal fluency with phonemic search restrictions was impaired. Abstract reasoning was low average on the DRS-2 Conceptualization sub-scale,  but performance on a clock drawing task was impaired.    On self-report measures of mood, the patient's responses were not indicative of clinically significant depression or anxiety at the present time.    Clinical Impressions: Moderate dementia most likely secondary to Alzheimer's disease, with behavioral disturbance.  Results of cognitive testing clearly were abnormal with global decline in cognitive function and several areas of more profound impairment. Additionally, there is evidence that her cognitive deficits are interfering with her ability to manage complex tasks (medications, driving). As such, diagnostic criteria for a dementia syndrome are met. The stage of dementia, based on test findings and current level of functioning, is most consistent with moderate stage dementia. At the moderate stage, it is more difficult to ascertain etiology due to the global nature of cognitive impairment. However, based on her relative weaknesses in semantic retrieval, temporal orientation, and memory encoding and consolidation, I believe Alzheimer's disease is the most likely etiology. It is possible that she could compensate initially for memory difficulties due to high level of cognitive reserve, but as she has progressed to  the moderate stage and is experiencing neuropsychiatric features, her functioning has plummeted.  Other etiologies, including Lewy body dementia and behavioral variant frontotemporal dementia were also considered, but she does not present with parkinsonism (making LBD less likely), and the nature of her cognitive impairment and her age make [name] less likely. Her MRI shows chronic small vessel ischemia, unchanged over recent years, with a remote left parieto-occipital infarct. I do not believe these findings are severe enough to be impacting her functioning to the level of dementia she is experiencing, and would not account for the significant progression of cognitive decline over time. I do not see any evidence of underlying psychiatric disorder. Her delusions and hallucinations are considered neuropsychiatric features directly related to her moderate stage dementia.    Recommendations/Plan: Based on the findings of the present evaluation, the following recommendations are offered:  1. Increased level of supervision and support is recommended in order to ensure safety and wellbeing. 24/7 supervision is recommended (either in home with caregivers or in a residential facility where she would likely need to be in memory care). For her safety and the safety of others, it is advised that she should discontinue all driving. She will also need all other instrumental ADLs managed for her, including medications, appointments, meals, and finances. Over time, she will likely require assistance with more basic ADLs as well.  2. She will benefit from regular social interaction and pleasurable activity; I could see her doing well in senior enrichment center activities or day program.  3. She may be a candidate for cholinesterase inhibitor therapy (eg donepezil, Namenda). She does not have a PCP and family would prefer she see a neurologist to manage this. We have scheduled her with Dr. Delice Lesch for the next available  work-in appt on 02/24/2018. 4. Her sons will benefit from caregiver education and support. I provided a packet of information on AD and community resources as well as resources from the Alzheimer's Association. They may also want to consider attending a caregiver support group (in person or online) or meeting with the caregiver support coordinator at [facility] [place].    Feedback to Patient: [name] and her family (husband and both sons) returned for a feedback appointment on 01/21/2018 to review the results of her neuropsychological evaluation with this provider. 75 minutes face-to-face time was spent reviewing her test results, my impressions and my recommendations  as detailed above.    Total time spent on this patient's case: 185 minutes for neurobehavioral status exam with psychologist (CPT code [phone], [phone]); 90 minutes of testing/scoring by psychometrician under psychologist's supervision (CPT codes [phone], [phone] units); 230 minutes for integration of patient data, interpretation of standardized test results and clinical data, clinical decision making, treatment planning and preparation of this report, and interactive feedback with review of results to the patient/family by psychologist (CPT codes [phone], [phone] units).      Thank you for your referral of [name]. Please feel free to contact me if you have any questions or concerns regarding this report.

## 2018-01-21 ENCOUNTER — Ambulatory Visit (INDEPENDENT_AMBULATORY_CARE_PROVIDER_SITE_OTHER): Payer: Medicare Other | Admitting: Psychology

## 2018-01-21 ENCOUNTER — Encounter: Payer: Self-pay | Admitting: Psychology

## 2018-01-21 DIAGNOSIS — F0281 Dementia in other diseases classified elsewhere with behavioral disturbance: Secondary | ICD-10-CM

## 2018-01-21 DIAGNOSIS — F02818 Dementia in other diseases classified elsewhere, unspecified severity, with other behavioral disturbance: Secondary | ICD-10-CM

## 2018-01-21 DIAGNOSIS — G301 Alzheimer's disease with late onset: Secondary | ICD-10-CM

## 2018-01-21 NOTE — Patient Instructions (Signed)
Clinical Impressions: Moderate dementia most likely secondary to Alzheimer's disease, with behavioral disturbance.  Results of cognitive testing clearly were abnormal with global decline in cognitive function and several areas of more profound impairment. Additionally, there is evidence that her cognitive deficits are interfering with her ability to manage complex tasks (medications, driving). As such, diagnostic criteria for a dementia syndrome are met. The stage of dementia, based on test findings and current level of functioning, is most consistent with moderate stage dementia. At the moderate stage, it is more difficult to ascertain etiology due to the global nature of cognitive impairment. However, based on her relative weaknesses in semantic retrieval, temporal orientation, and memory encoding and consolidation, I believe Alzheimer's disease is the most likely etiology. It is possible that she could compensate initially for memory difficulties due to high level of cognitive reserve, but as she has progressed to the moderate stage and is experiencing neuropsychiatric features, her functioning has plummeted.  Other etiologies, including Lewy body dementia and behavioral variant frontotemporal dementia were also considered, but she does not present with parkinsonism (making LBD less likely), and the nature of her cognitive impairment and her age make Rosanna Randy less likely. Her MRI shows chronic small vessel ischemia, unchanged over recent years, with a remote left parieto-occipital infarct. I do not believe these findings are severe enough to be impacting her functioning to the level of dementia she is experiencing, and would not account for the significant progression of cognitive decline over time. I do not see any evidence of underlying psychiatric disorder. Her delusions and hallucinations are considered neuropsychiatric features directly related to her moderate stage dementia.     Recommendations/Plan: Based on the findings of the present evaluation, the following recommendations are offered:  1. Increased level of supervision and support is recommended in order to ensure safety and wellbeing. 24/7 supervision is recommended (either in home with caregivers or in a residential facility where she would likely need to be in memory care). For her safety and the safety of others, it is advised that she should discontinue all driving. She will also need all other instrumental ADLs managed for her, including medications, appointments, meals, and finances. Over time, she will likely require assistance with more basic ADLs as well.  2. She will benefit from regular social interaction and pleasurable activity; I could see her doing well in senior enrichment center activities or day program.  3. She may be a candidate for cholinesterase inhibitor therapy (eg donepezil, Namenda). If she and her family are interested in this, they can speak with her PCP more about this to ensure it is not contraindicated.  4. Her sons will benefit from caregiver education and support. I provided a packet of information on AD and community resources as well as resources from the Alzheimer's Association. They may also want to consider attending a caregiver support group (in person or online) or meeting with the caregiver support coordinator at [facility] [place].

## 2018-01-22 ENCOUNTER — Ambulatory Visit (INDEPENDENT_AMBULATORY_CARE_PROVIDER_SITE_OTHER): Payer: Medicare Other | Admitting: Neurology

## 2018-01-22 ENCOUNTER — Encounter: Payer: Self-pay | Admitting: Neurology

## 2018-01-22 ENCOUNTER — Other Ambulatory Visit: Payer: Self-pay

## 2018-01-22 VITALS — BP 168/86 | HR 71 | Wt 102.0 lb

## 2018-01-22 DIAGNOSIS — G301 Alzheimer's disease with late onset: Secondary | ICD-10-CM

## 2018-01-22 DIAGNOSIS — F0281 Dementia in other diseases classified elsewhere with behavioral disturbance: Secondary | ICD-10-CM

## 2018-01-22 MED ORDER — QUETIAPINE FUMARATE 25 MG PO TABS: ORAL_TABLET | ORAL | 11 refills | Status: DC

## 2018-01-22 MED ORDER — ARIPIPRAZOLE 5 MG PO TABS: 5.0000 mg | ORAL_TABLET | Freq: Every day | ORAL | 11 refills | Status: DC

## 2018-01-22 NOTE — Progress Notes (Signed)
NEUROLOGY CONSULTATION NOTE  [name] MRN: [id number] DOB: [birth year]  Referring provider: Dr. Kandis Nab Primary care provider: Dr. Diannia Ruder  Reason for consult:  dementia  Dear Dr Bonita Quin:  Thank you for your kind referral of [name] for consultation of the above symptoms. Although her history is well known to you, please allow me to reiterate it for the purpose of our medical record. The patient was accompanied to the clinic by her husband and sons Sonia Side and [name] who also provide collateral information. Records and images were personally reviewed where available.  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 She underwent Neuropsychological evaluation a few months ago, history will be briefly reviewed for our records. Cognitive and behavioral changes started around 3-4 years ago. She would think someone was taking items when she could not find them. She started getting more agitated and paranoid, thinking her son from [place] was taking things from her house. Around 2 years ago, her son killed 2 snakes in her property and a small lizard got into the house. She got increasingly paranoid that there were multiple lizards in the house. She also started to think there were people outside her house and called the polices several times. She was seeing animals and people inside her house. At one point she thought the heating technician installed listening devices in her house. She was brought to the ER in 06/2017 for visual hallucinations and paranoid delusions. Bloodwork was unremarkable. I personally reviewed head CT without contrast which showed diffuse atrophy and chronic microvascular disease, remote left parieto-occipital infarct with encephalomalacia. She called EMS in 08/2017 and was brought to the  ER for delusions of parasite infestation. She was IVC'd and admitted to geriatric behavioral health for 6 days where Abilify was started. She was back in the ER in 09/2017 when she did not recognize her husband or herself in the mirror, thinking there was a women trying to get into the bed with her (it was her husband). Abilify dose was increased and Seroquel started. Her family reports that these 2 medications have helped a lot. They do not want the word "psychosis" mentioned to the patient, and ask that they be calmed "calming medications." She was in the ER in 11/2017 for chest discomfort and weakness and diagnosed with Takatsubo cardiomyopathy. Seroquel and Abilify were stopped, however she was back a few days later for agitation and medications were restarted.   Her neuropsychological evaluation indicated Moderate dementia most likely secondary to Alzheimer's disease, with behavioral disturbance. Due to moderate stage, it is more difficult to ascertain etiology of dementia due to global nature of impairment, however based on weaknesses on testing, Alzheimer's is most likely. Other etiologies such as LBD and FTD-bv were considered, but she does not have any signs of parkinsonism. There is no evidence of an underlying psychiatric disorder, delusions and hallucinations are considered neuropsychiatric features directly related to her moderate stage dementia.  She does not drive. Her sons help manage her and her husband's medications. Bills are on autopay. She is able to bathe and dress independently. She reports she is forgetful at times, forgetting where she put things. She has occasional dizziness with positional changes. She has occasional low back pain. Otherwise she denies any headaches, dizziness, diplopia, dysarthria/dysphagia, neck pain, focal numbness/tingling/weakness, bowel/bladder dysfunction, anosmia, or tremors.   Laboratory Data:  Lab Results  Component Value Date   [id number] 330 09/26/2017      PAST MEDICAL HISTORY: Past Medical History:  Diagnosis Date  . Acute lateral wall myocardial infarction ([place]) 11/28/2017  . Crohn disease ([place])   . History of kidney stones   . Hypertension   . TIA (transient ischemic attack) 01/2014   hx/notes 02/06/2014    PAST SURGICAL HISTORY: Past Surgical History:  Procedure Laterality Date  . CARDIAC CATHETERIZATION  11/28/2017  . COLECTOMY    . DILATION AND CURETTAGE OF UTERUS    . ESOPHAGOGASTRODUODENOSCOPY  10/2008   Archie Endo 02/14/2011  . LAPAROSCOPIC RIGHT HEMI COLECTOMY  1970s   Archie Endo 5/2/012  . LEFT HEART CATH AND CORONARY ANGIOGRAPHY N/A 11/28/2017   Procedure: LEFT HEART CATH AND CORONARY ANGIOGRAPHY;  Surgeon: Charolette Forward, MD;  Location: [facility];  Service: Cardiovascular;  Laterality: N/A;  . TONSILLECTOMY      MEDICATIONS: Current Outpatient Medications on File Prior to Visit  Medication Sig Dispense Refill  . ARIPiprazole (ABILIFY) 5 MG tablet Take 1 tablet (5 mg total) by mouth daily. 30 tablet 0  . aspirin EC 81 MG EC tablet Take 1 tablet (81 mg total) by mouth daily. 30 tablet 3  . atorvastatin (LIPITOR) 40 MG tablet Take 1 tablet (40 mg total) by mouth daily at 6 PM. 30 tablet 3  . carvedilol (COREG) 3.125 MG tablet Take 1 tablet (3.125 mg total) by mouth 2 (two) times daily with a meal. 60 tablet 3  . clopidogrel (PLAVIX) 75 MG tablet Take 1 tablet (75 mg total) by mouth daily. 30 tablet 3  . hydroxypropyl methylcellulose (ISOPTO TEARS) 2.5 % ophthalmic solution Place 1 drop into both eyes 4 (four) times daily as needed for dry eyes.    . nitroGLYCERIN (NITROSTAT) 0.4 MG SL tablet Place 1 tablet (0.4 mg total) under the tongue every 5 (five) minutes x 3 doses as needed for chest pain. 25 tablet 12  . pantoprazole (PROTONIX) 20 MG tablet Take 1 tablet (20 mg total) by mouth daily. 30 tablet 1  . QUEtiapine (SEROQUEL) 25 MG tablet Take 0.5 tablets (12.5 mg total) by mouth 3 (three) times daily. 30 tablet 0    . ramipril (ALTACE) 5 MG capsule Take 1 capsule (5 mg total) by mouth daily. 30 capsule 3   No current facility-administered medications on file prior to visit.     ALLERGIES: Allergies  Allergen Reactions  . Sulfa Antibiotics Anaphylaxis and Rash  . Codeine Nausea And Vomiting  . Peanut-Containing Drug Products     FAMILY HISTORY: No family history on file.  SOCIAL HISTORY: Social History   Socioeconomic History  . Marital status: Married    Spouse name: Not on file  . Number of children: Not on file  . Years of education: Not on file  . Highest education level: Not on file  Occupational History  . Not on file  Social Needs  . Financial resource strain: Not on file  . Food insecurity:    Worry: Not on file    Inability: Not on file  . Transportation needs:    Medical: Not on file    Non-medical: Not on file  Tobacco Use  . Smoking status: Never Smoker  . Smokeless tobacco: Never Used  Substance and Sexual Activity  . Alcohol use: No  . Drug use: No  . Sexual activity: Never  Lifestyle  . Physical activity:    Days per week: Not on file  Minutes per session: Not on file  . Stress: Not on file  Relationships  . Social connections:    Talks on phone: Not on file    Gets together: Not on file    Attends religious service: Not on file    Active member of club or organization: Not on file    Attends meetings of clubs or organizations: Not on file    Relationship status: Not on file  . Intimate partner violence:    Fear of current or ex partner: Not on file    Emotionally abused: Not on file    Physically abused: Not on file    Forced sexual activity: Not on file  Other Topics Concern  . Not on file  Social History Narrative  . Not on file    REVIEW OF SYSTEMS: Constitutional: No fevers, chills, or sweats, no generalized fatigue, change in appetite Eyes: No visual changes, double vision, eye pain Ear, nose and throat: No hearing loss, ear pain, nasal  congestion, sore throat Cardiovascular: No chest pain, palpitations Respiratory:  No shortness of breath at rest or with exertion, wheezes GastrointestinaI: No nausea, vomiting, diarrhea, abdominal pain, fecal incontinence Genitourinary:  No dysuria, urinary retention or frequency Musculoskeletal:  No neck pain, +back pain Integumentary: No rash, pruritus, skin lesions Neurological: as above Psychiatric: No depression, insomnia, anxiety Endocrine: No palpitations, fatigue, diaphoresis, mood swings, change in appetite, change in weight, increased thirst Hematologic/Lymphatic:  No anemia, purpura, petechiae. Allergic/Immunologic: no itchy/runny eyes, nasal congestion, recent allergic reactions, rashes  PHYSICAL EXAM: Vitals:   01/22/18 1359  BP: (!) 168/86  Pulse: 71  SpO2: 100%   General: No acute distress Head:  Normocephalic/atraumatic Eyes: Fundoscopic exam shows bilateral sharp discs, no vessel changes, exudates, or hemorrhages Neck: supple, no paraspinal tenderness, full range of motion Back: No paraspinal tenderness Heart: regular rate and rhythm Lungs: Clear to auscultation bilaterally. Vascular: No carotid bruits. Skin/Extremities: No rash, no edema Neurological Exam: Mental status: alert and oriented to person, place, and time, no dysarthria or aphasia, Fund of knowledge is appropriate.  Recent and remote memory are impaired.  Attention and concentration are normal.    Able to name objects and repeat phrases. Cranial nerves: CN I: not tested CN II: pupils equal, round and reactive to light, visual fields intact, fundi unremarkable. CN III, IV, VI:  full range of motion, no nystagmus, no ptosis CN V: facial sensation intact CN VII: upper and lower face symmetric CN VIII: hearing intact to finger rub CN IX, X: gag intact, uvula midline CN XI: sternocleidomastoid and trapezius muscles intact CN XII: tongue midline Bulk & Tone: normal, no fasciculations. Motor: 5/5  throughout with no pronator drift. Sensation: intact to light touch, cold, pin, vibration and joint position sense.  No extinction to double simultaneous stimulation.  Romberg test negative Deep Tendon Reflexes: +1 throughout, no ankle clonus Plantar responses: downgoing bilaterally Cerebellar: no incoordination on finger to nose, heel to shin. No dysdiadochokinesia Gait: narrow-based and steady, able to tandem walk adequately. Tremor: none  IMPRESSION: This is a pleasant 83 year old right-handed woman with a history of hypertension, hyperlipidemia, CAD s/p MI, with recently diagnosed by Neuropsychological evaluation with Moderate dementia likely due to Alzheimer's disease with behavioral disturbance consisting of paranoia and hallucinations. Behavioral changes are more controlled with Abilify and Seroquel, we discussed cardiac black box warning, her family feels benefits outweigh risks and ask for refills today. I discussed starting Donepezil, including side effects and expectations, she would like  to hold off and discuss with family first. We discussed Neuropsychological testing results and recommendations, including increased level of supervision, which her family is currently providing at home. No further driving. Look into day programs. All of their questions were answered, she will follow-up in 6 months and knows to call for any changes.   Thank you for allowing me to participate in the care of this patient. Please do not hesitate to call for any questions or concerns.   Ellouise Newer, M.D.  CC: Dr. Regan Rakers, Dr. Bonita Quin

## 2018-01-22 NOTE — Patient Instructions (Signed)
1. Continue Abilify and Seroquel. We may increase dose further if needed 2. Discuss Aricept (Donepezil) within family and let us know if this is something you want to start to help slow down the worsening of memory loss 3. Follow-up in 6 months, call for any changes  FALL PRECAUTIONS: Be cautious when walking. Scan the area for obstacles that may increase the risk of trips and falls. When getting up in the mornings, sit up at the edge of the bed for a few minutes before getting out of bed. Consider elevating the bed at the head end to avoid drop of blood pressure when getting up. Walk always in a well-lit room (use night lights in the walls). Avoid area rugs or power cords from appliances in the middle of the walkways. Use a walker or a cane if necessary and consider physical therapy for balance exercise. Get your eyesight checked regularly.  FINANCIAL OVERSIGHT: Supervision, especially oversight when making financial decisions or transactions is also recommended.  HOME SAFETY: Consider the safety of the kitchen when operating appliances like stoves, microwave oven, and blender. Consider having supervision and share cooking responsibilities until no longer able to participate in those. Accidents with firearms and other hazards in the house should be identified and addressed as well.  DRIVING: Regarding driving, in patients with progressive memory problems, driving will be impaired. We advise to have someone else do the driving if trouble finding directions or if minor accidents are reported. Independent driving assessment is available to determine safety of driving.  ABILITY TO BE LEFT ALONE: If patient is unable to contact 911 operator, consider using LifeLine, or when the need is there, arrange for someone to stay with patients. Smoking is a fire hazard, consider supervision or cessation. Risk of wandering should be assessed by caregiver and if detected at any point, supervision and safe proof  recommendations should be instituted.  MEDICATION SUPERVISION: Inability to self-administer medication needs to be constantly addressed. Implement a mechanism to ensure safe administration of the medications.  RECOMMENDATIONS FOR ALL PATIENTS WITH MEMORY PROBLEMS: 1. Continue to exercise (Recommend 30 minutes of walking everyday, or 3 hours every week) 2. Increase social interactions - continue going to [place] and enjoy social gatherings with friends and family 3. Eat healthy, avoid fried foods and eat more fruits and vegetables 4. Maintain adequate blood pressure, blood sugar, and blood cholesterol level. Reducing the risk of stroke and cardiovascular disease also helps promoting better memory. 5. Avoid stressful situations. Live a simple life and avoid aggravations. Organize your time and prepare for the next day in anticipation. 6. Sleep well, avoid any interruptions of sleep and avoid any distractions in the bedroom that may interfere with adequate sleep quality 7. Avoid sugar, avoid sweets as there is a strong link between excessive sugar intake, diabetes, and cognitive impairment The Mediterranean diet has been shown to help patients reduce the risk of progressive memory disorders and reduces cardiovascular risk. This includes eating fish, eat fruits and green leafy vegetables, nuts like almonds and hazelnuts, walnuts, and also use olive oil. Avoid fast foods and fried foods as much as possible. Avoid sweets and sugar as sugar use has been linked to worsening of memory function.  There is always a concern of gradual progression of memory problems. If this is the case, then we may need to adjust level of care according to patient needs. Support, both to the patient and caregiver, should then be put into place.

## 2018-01-26 ENCOUNTER — Other Ambulatory Visit (HOSPITAL_COMMUNITY): Payer: Self-pay | Admitting: Psychiatry

## 2018-01-27 ENCOUNTER — Encounter: Payer: Self-pay | Admitting: Neurology

## 2018-01-27 DIAGNOSIS — F0281 Dementia in other diseases classified elsewhere with behavioral disturbance: Secondary | ICD-10-CM | POA: Insufficient documentation

## 2018-01-27 DIAGNOSIS — G301 Alzheimer's disease with late onset: Principal | ICD-10-CM

## 2018-02-10 ENCOUNTER — Encounter (HOSPITAL_COMMUNITY): Payer: Self-pay | Admitting: Emergency Medicine

## 2018-02-10 DIAGNOSIS — G301 Alzheimer's disease with late onset: Secondary | ICD-10-CM | POA: Diagnosis not present

## 2018-02-10 DIAGNOSIS — I129 Hypertensive chronic kidney disease with stage 1 through stage 4 chronic kidney disease, or unspecified chronic kidney disease: Secondary | ICD-10-CM | POA: Insufficient documentation

## 2018-02-10 DIAGNOSIS — N184 Chronic kidney disease, stage 4 (severe): Secondary | ICD-10-CM | POA: Diagnosis not present

## 2018-02-10 DIAGNOSIS — Z7902 Long term (current) use of antithrombotics/antiplatelets: Secondary | ICD-10-CM | POA: Diagnosis not present

## 2018-02-10 DIAGNOSIS — Z79899 Other long term (current) drug therapy: Secondary | ICD-10-CM | POA: Diagnosis not present

## 2018-02-10 DIAGNOSIS — Z7982 Long term (current) use of aspirin: Secondary | ICD-10-CM | POA: Diagnosis not present

## 2018-02-10 DIAGNOSIS — R39198 Other difficulties with micturition: Secondary | ICD-10-CM | POA: Diagnosis present

## 2018-02-10 DIAGNOSIS — N3000 Acute cystitis without hematuria: Secondary | ICD-10-CM | POA: Insufficient documentation

## 2018-02-10 DIAGNOSIS — F0281 Dementia in other diseases classified elsewhere with behavioral disturbance: Secondary | ICD-10-CM | POA: Insufficient documentation

## 2018-02-10 NOTE — ED Triage Notes (Signed)
Patient here from home with complaints of hypertension.

## 2018-02-11 ENCOUNTER — Emergency Department (HOSPITAL_COMMUNITY)
Admission: EM | Admit: 2018-02-11 | Discharge: 2018-02-11 | Disposition: A | Discharge status: Home / Self Care | Payer: Medicare Other | Attending: Emergency Medicine | Admitting: Emergency Medicine

## 2018-02-11 DIAGNOSIS — N3000 Acute cystitis without hematuria: Secondary | ICD-10-CM | POA: Diagnosis not present

## 2018-02-11 DIAGNOSIS — R34 Anuria and oliguria: Secondary | ICD-10-CM

## 2018-02-11 LAB — URINALYSIS, ROUTINE W REFLEX MICROSCOPIC
BILIRUBIN URINE: NEGATIVE
GLUCOSE, UA: NEGATIVE mg/dL
Ketones, ur: NEGATIVE mg/dL
NITRITE: NEGATIVE
PROTEIN: 30 mg/dL — AB
Specific Gravity, Urine: 1.017 (ref 1.005–1.030)
pH: 6 (ref 5.0–8.0)

## 2018-02-11 LAB — BASIC METABOLIC PANEL
ANION GAP: 12 (ref 5–15)
BUN: 23 mg/dL — ABNORMAL HIGH (ref 6–20)
CHLORIDE: 106 mmol/L (ref 101–111)
CO2: 26 mmol/L (ref 22–32)
Calcium: 10.4 mg/dL — ABNORMAL HIGH (ref 8.9–10.3)
Creatinine, Ser: 1.03 mg/dL — ABNORMAL HIGH (ref 0.44–1.00)
GFR calc non Af Amer: 50 mL/min — ABNORMAL LOW (ref >60)
GFR, EST AFRICAN AMERICAN: 58 mL/min — AB (ref >60)
Glucose, Bld: 97 mg/dL (ref 65–99)
POTASSIUM: 3.7 mmol/L (ref 3.5–5.1)
SODIUM: 144 mmol/L (ref 135–145)

## 2018-02-11 LAB — CBC WITH DIFFERENTIAL/PLATELET
Basophils Absolute: 0 10*3/uL (ref 0.0–0.1)
Basophils Relative: 0 %
Eosinophils Absolute: 0.1 10*3/uL (ref 0.0–0.7)
Eosinophils Relative: 1 %
HEMATOCRIT: 38 % (ref 36.0–46.0)
HEMOGLOBIN: 12.4 g/dL (ref 12.0–15.0)
LYMPHS ABS: 1.5 10*3/uL (ref 0.7–4.0)
Lymphocytes Relative: 14 %
MCH: 31.2 pg (ref 26.0–34.0)
MCHC: 32.6 g/dL (ref 30.0–36.0)
MCV: 95.7 fL (ref 78.0–100.0)
MONOS PCT: 4 %
Monocytes Absolute: 0.4 10*3/uL (ref 0.1–1.0)
NEUTROS ABS: 8.9 10*3/uL — AB (ref 1.7–7.7)
NEUTROS PCT: 81 %
Platelets: 133 10*3/uL — ABNORMAL LOW (ref 150–400)
RBC: 3.97 MIL/uL (ref 3.87–5.11)
RDW: 15.3 % (ref 11.5–15.5)
WBC: 10.9 10*3/uL — AB (ref 4.0–10.5)

## 2018-02-11 MED ORDER — CEPHALEXIN 500 MG PO CAPS: 500.0000 mg | ORAL_CAPSULE | Freq: Three times a day (TID) | ORAL | 0 refills | Status: DC

## 2018-02-11 NOTE — ED Notes (Signed)
Pt made aware of need for urine. Pt will ring call bell when she can void.

## 2018-02-11 NOTE — ED Notes (Signed)
Pt is on bedpan attempting to provide urine specimen

## 2018-02-11 NOTE — Discharge Instructions (Addendum)
You were seen today for urinary symptoms.  Your urinalysis may show a urinary tract infection.  Urine culture is pending.  Take antibiotics as prescribed.  If you develop fever, back pain, nausea, vomiting you should be reevaluated.

## 2018-02-11 NOTE — ED Provider Notes (Signed)
[facility] DEPT Provider Note   CSN: [id number] Arrival date & time: 02/10/18  1839     History   Chief Complaint Chief Complaint  Patient presents with  . Hypertension    HPI [name] is a 83 y.o. female.  HPI  This is an 83 year old female with a history of coronary artery disease, Crohn's disease, hypertension who presents with concerns for decreased urination.  Initial triage notes that she was concerned regarding her hypertension; however, she states "well when I took my blood pressure and it was high of course I was concerned."  She states that over the last 2 to 3 days she has noted decreased urination.  She denies any dysuria or frequency.  She denies any fevers or back pain.  She denies any chest pain, shortness of breath, abdominal pain, nausea, vomiting.  She reports that she did take her blood pressure medications yesterday.  Past Medical History:  Diagnosis Date  . Acute lateral wall myocardial infarction ([facility]) 11/28/2017  . Crohn disease ([place])   . History of kidney stones   . Hypertension   . TIA (transient ischemic attack) 01/2014   hx/notes 02/06/2014    Patient Active Problem List   Diagnosis Date Noted  . Late onset Alzheimer's disease with behavioral disturbance 01/27/2018  . Adjustment disorder with emotional disturbance 12/05/2017  . Acute MI, lateral wall ([place]) 11/28/2017  . Paranoid delusion ([place]) 06/26/2017  . CKD (chronic kidney disease) stage 4, GFR 15-29 ml/min ([facility]) 01/31/2017  . Palpitations 08/29/2016  . TIA (transient ischemic attack) 02/02/2014    Past Surgical History:  Procedure Laterality Date  . CARDIAC CATHETERIZATION  11/28/2017  . COLECTOMY    . DILATION AND CURETTAGE OF UTERUS    . ESOPHAGOGASTRODUODENOSCOPY  10/2008   Archie Endo 02/14/2011  . LAPAROSCOPIC RIGHT HEMI COLECTOMY  1970s   Archie Endo 5/2/012  . LEFT HEART CATH AND CORONARY ANGIOGRAPHY N/A 11/28/2017   Procedure: LEFT HEART CATH AND  CORONARY ANGIOGRAPHY;  Surgeon: Charolette Forward, MD;  Location: [facility];  Service: Cardiovascular;  Laterality: N/A;  . TONSILLECTOMY       OB History   None      Home Medications    Prior to Admission medications   Medication Sig Start Date End Date Taking? Authorizing Provider  ARIPiprazole (ABILIFY) 5 MG tablet Take 1 tablet (5 mg total) by mouth daily. 01/22/18  Yes Cameron Sprang, MD  carvedilol (COREG) 6.25 MG tablet Take 6.25 mg by mouth 2 (two) times daily with a meal.   Yes [provider]  Multiple Vitamins-Minerals (MULTIVITAMIN ADULTS) TABS Take 0.5 tablets by mouth daily.   Yes [provider]  ramipril (ALTACE) 10 MG capsule Take 10 mg by mouth daily.   Yes [provider]  aspirin EC 81 MG EC tablet Take 1 tablet (81 mg total) by mouth daily. 12/01/17   Charolette Forward, MD  atorvastatin (LIPITOR) 40 MG tablet Take 1 tablet (40 mg total) by mouth daily at 6 PM. Patient taking differently: Take 20 mg by mouth daily at 6 PM.  11/30/17   Charolette Forward, MD  carvedilol (COREG) 3.125 MG tablet Take 1 tablet (3.125 mg total) by mouth 2 (two) times daily with a meal. Patient not taking: Reported on 02/11/2018 11/30/17   Charolette Forward, MD  cephALEXin (KEFLEX) 500 MG capsule Take 1 capsule (500 mg total) by mouth 3 (three) times daily. 02/11/18   Taneasha Fuqua, Barbette Hair, MD  clopidogrel (PLAVIX)  75 MG tablet Take 1 tablet (75 mg total) by mouth daily. 12/01/17   Charolette Forward, MD  hydroxypropyl methylcellulose (ISOPTO TEARS) 2.5 % ophthalmic solution Place 1 drop into both eyes 4 (four) times daily as needed for dry eyes.    [provider]  nitroGLYCERIN (NITROSTAT) 0.4 MG SL tablet Place 1 tablet (0.4 mg total) under the tongue every 5 (five) minutes x 3 doses as needed for chest pain. 11/30/17   Charolette Forward, MD  pantoprazole (PROTONIX) 20 MG tablet Take 1 tablet (20 mg total) by mouth daily. 11/30/17 11/30/18  Charolette Forward, MD  QUEtiapine  (SEROQUEL) 25 MG tablet Take 1/2 tablet at bedtime 01/22/18   Cameron Sprang, MD  ramipril (ALTACE) 5 MG capsule Take 1 capsule (5 mg total) by mouth daily. Patient not taking: Reported on 02/11/2018 12/01/17   Charolette Forward, MD    Family History No family history on file.  Social History Social History   Tobacco Use  . Smoking status: Never Smoker  . Smokeless tobacco: Never Used  Substance Use Topics  . Alcohol use: No  . Drug use: No     Allergies   Sulfa antibiotics; Codeine; and Peanut-containing drug products   Review of Systems Review of Systems  Constitutional: Negative for fever.  Respiratory: Negative for shortness of breath.   Cardiovascular: Negative for chest pain.  Gastrointestinal: Negative for abdominal pain, nausea and vomiting.  Genitourinary: Negative for dysuria, flank pain, frequency and vaginal bleeding.       Decreased urination  Musculoskeletal: Negative for back pain.  All other systems reviewed and are negative.    Physical Exam Updated Vital Signs BP (!) 102/49 (BP Location: Right Arm)   Pulse (!) 57   Temp 97.7 F (36.5 C) (Oral)   Resp 16   Ht 5' 2"  (1.575 m)   Wt 43.3 kg (95 lb 8 oz)   SpO2 100%   BMI 17.47 kg/m   Physical Exam  Constitutional:  Elderly appearing, nontoxic, no acute distress  HENT:  Head: Normocephalic and atraumatic.  Cardiovascular: Normal rate, regular rhythm and normal heart sounds.  Pulmonary/Chest: Effort normal and breath sounds normal. No respiratory distress. She has no wheezes.  Abdominal: Soft. Bowel sounds are normal. There is no tenderness. There is no guarding.  Neurological: She is alert.  Oriented x2, able to provide history  Skin: Skin is warm and dry.  Psychiatric: She has a normal mood and affect.  Nursing note and vitals reviewed.    ED Treatments / Results  Labs (all labs ordered are listed, but only abnormal results are displayed) Labs Reviewed  CBC WITH DIFFERENTIAL/PLATELET -  Abnormal; Notable for the following components:      Result Value   WBC 10.9 (*)    Platelets 133 (*)    Neutro Abs 8.9 (*)    All other components within normal limits  BASIC METABOLIC PANEL - Abnormal; Notable for the following components:   BUN 23 (*)    Creatinine, Ser 1.03 (*)    Calcium 10.4 (*)    GFR calc non Af Amer 50 (*)    GFR calc Af Amer 58 (*)    All other components within normal limits  URINALYSIS, ROUTINE W REFLEX MICROSCOPIC - Abnormal; Notable for the following components:   APPearance HAZY (*)    Hgb urine dipstick MODERATE (*)    Protein, ur 30 (*)    Leukocytes, UA LARGE (*)    Bacteria, UA RARE (*)  All other components within normal limits  URINE CULTURE    EKG None  Radiology No results found.  Procedures Procedures (including critical care time)  Medications Ordered in ED Medications - No data to display   Initial Impression / Assessment and Plan / ED Course  I have reviewed the triage vital signs and the nursing notes.  Pertinent labs & imaging results that were available during my care of the patient were reviewed by me and considered in my medical decision making (see chart for details).     Patient presents with complaints of decreased urination.  She denies any other symptoms.  She also reports high blood pressure but is otherwise asymptomatic.  Initial blood pressure was 181/80.  This downtrended while in the emergency department without intervention.  Otherwise her vital signs are reassuring.  She is afebrile.  Lab work obtained and largely reassuring.  Kidney function at baseline.  Urinalysis shows large leukocyte esterase, 21-50 white cells, rare bacteria.  Urine culture sent.  Given symptoms, would elect to treat.  Will discharge home with Keflex.  Patient and her family were updated at the bedside.  After history, exam, and medical workup I feel the patient has been appropriately medically screened and is safe for discharge home.  Pertinent diagnoses were discussed with the patient. Patient was given return precautions.   Final Clinical Impressions(s) / ED Diagnoses   Final diagnoses:  Acute cystitis without hematuria  Decreased urination    ED Discharge Orders        Ordered    cephALEXin (KEFLEX) 500 MG capsule  3 times daily     02/11/18 0602       Andelyn Spade, Barbette Hair, MD 02/11/18 [phone]

## 2018-02-12 LAB — URINE CULTURE

## 2018-02-14 ENCOUNTER — Other Ambulatory Visit: Payer: Self-pay | Admitting: Neurology

## 2018-02-24 ENCOUNTER — Ambulatory Visit: Payer: Medicare Other | Admitting: Neurology

## 2018-03-20 ENCOUNTER — Other Ambulatory Visit: Payer: Self-pay | Admitting: Neurology

## 2018-06-26 ENCOUNTER — Emergency Department (HOSPITAL_COMMUNITY)
Admission: EM | Admit: 2018-06-26 | Discharge: 2018-06-27 | Disposition: A | Discharge status: Home / Self Care | Payer: Medicare Other | Attending: Emergency Medicine | Admitting: Emergency Medicine

## 2018-06-26 ENCOUNTER — Encounter (HOSPITAL_COMMUNITY): Payer: Self-pay | Admitting: Emergency Medicine

## 2018-06-26 DIAGNOSIS — Z7982 Long term (current) use of aspirin: Secondary | ICD-10-CM | POA: Diagnosis not present

## 2018-06-26 DIAGNOSIS — Z79899 Other long term (current) drug therapy: Secondary | ICD-10-CM | POA: Diagnosis not present

## 2018-06-26 DIAGNOSIS — I10 Essential (primary) hypertension: Secondary | ICD-10-CM | POA: Diagnosis not present

## 2018-06-26 DIAGNOSIS — K625 Hemorrhage of anus and rectum: Secondary | ICD-10-CM | POA: Diagnosis not present

## 2018-06-26 DIAGNOSIS — K6289 Other specified diseases of anus and rectum: Secondary | ICD-10-CM | POA: Diagnosis present

## 2018-06-26 DIAGNOSIS — G309 Alzheimer's disease, unspecified: Secondary | ICD-10-CM | POA: Insufficient documentation

## 2018-06-26 DIAGNOSIS — Z8673 Personal history of transient ischemic attack (TIA), and cerebral infarction without residual deficits: Secondary | ICD-10-CM | POA: Diagnosis not present

## 2018-06-26 DIAGNOSIS — F0280 Dementia in other diseases classified elsewhere without behavioral disturbance: Secondary | ICD-10-CM | POA: Insufficient documentation

## 2018-06-26 DIAGNOSIS — Z7902 Long term (current) use of antithrombotics/antiplatelets: Secondary | ICD-10-CM | POA: Diagnosis not present

## 2018-06-26 DIAGNOSIS — Z91010 Allergy to peanuts: Secondary | ICD-10-CM | POA: Diagnosis not present

## 2018-06-26 DIAGNOSIS — K5641 Fecal impaction: Secondary | ICD-10-CM | POA: Diagnosis not present

## 2018-06-26 HISTORY — DX: Dementia in other diseases classified elsewhere, unspecified severity, without behavioral disturbance, psychotic disturbance, mood disturbance, and anxiety: F02.80

## 2018-06-26 HISTORY — DX: Alzheimer's disease, unspecified: G30.9

## 2018-06-26 NOTE — ED Triage Notes (Signed)
Patient reports having abdominal and rectal pain all day. She has also noticed bright red blood from her rectum. Patient and family are not in agreement of when the patient had her last bowel movement but the patient says it has been a few days.

## 2018-06-26 NOTE — ED Notes (Signed)
Bed: WA07 Expected date:  Expected time:  Means of arrival:  Comments: EMS 83 yo female abdominal and rectal pain/rectal bleeding

## 2018-06-26 NOTE — ED Provider Notes (Signed)
[facility] DEPT Provider Note: [name], MD, FACEP  CSN: [id number] MRN: [id number] ARRIVAL: 06/26/18 at 2316 ROOM: WA09/WA09   CHIEF COMPLAINT  Abdominal Pain and Rectal Bleeding  Level 5 caveat: Dementia HISTORY OF PRESENT ILLNESS  06/26/18 11:39 PM [name] is a 83 y.o. female with a history of Crohn's disease.  Nurse's notes indicate the patient was complaining of abdominal and rectal pain all day with rectal bleeding.  The patient makes no mention of pain to me but does complain of passing blood earlier.  She is having difficulty specifying the timing or the nature of the bleeding and is very vague about her symptomatology, likely due to Alzheimer's.  She is not complaining of pain at rest but is having with lower abdominal pain with palpation.  The patient's son states she has had a 2-day history of lower abdominal pain and constipation.  About 10 PM she had an episode of bright red blood blood per rectum.  He acknowledges that she is confused at her baseline.   Past Medical History:  Diagnosis Date  . Acute lateral wall myocardial infarction ([place]) 11/28/2017  . Alzheimer disease   . Crohn disease ([place])   . History of kidney stones   . Hypertension   . TIA (transient ischemic attack) 01/2014   hx/notes 02/06/2014    Past Surgical History:  Procedure Laterality Date  . CARDIAC CATHETERIZATION  11/28/2017  . COLECTOMY    . DILATION AND CURETTAGE OF UTERUS    . ESOPHAGOGASTRODUODENOSCOPY  10/2008   Archie Endo 02/14/2011  . LAPAROSCOPIC RIGHT HEMI COLECTOMY  1970s   Archie Endo 5/2/012  . LEFT HEART CATH AND CORONARY ANGIOGRAPHY N/A 11/28/2017   Procedure: LEFT HEART CATH AND CORONARY ANGIOGRAPHY;  Surgeon: Charolette Forward, MD;  Location: [facility];  Service: Cardiovascular;  Laterality: N/A;  . TONSILLECTOMY      History reviewed. No pertinent family history.  Social History   Tobacco Use  . Smoking status: Never Smoker  . Smokeless tobacco: Never Used   Substance Use Topics  . Alcohol use: No  . Drug use: No    Prior to Admission medications   Medication Sig Start Date End Date Taking? Authorizing Provider  ARIPiprazole (ABILIFY) 5 MG tablet TAKE 1 TABLET BY MOUTH EVERY DAY 02/17/18  Yes Cameron Sprang, MD  aspirin EC 81 MG EC tablet Take 1 tablet (81 mg total) by mouth daily. 12/01/17  Yes Charolette Forward, MD  atorvastatin (LIPITOR) 40 MG tablet Take 1 tablet (40 mg total) by mouth daily at 6 PM. Patient taking differently: Take 20 mg by mouth daily at 6 PM.  11/30/17  Yes Charolette Forward, MD  carvedilol (COREG) 6.25 MG tablet Take 6.25 mg by mouth 2 (two) times daily with a meal.   Yes [provider]  clopidogrel (PLAVIX) 75 MG tablet Take 1 tablet (75 mg total) by mouth daily. 12/01/17  Yes Charolette Forward, MD  furosemide (LASIX) 40 MG tablet Take 40 mg by mouth daily. 05/26/18  Yes [provider]  hydroxypropyl methylcellulose (ISOPTO TEARS) 2.5 % ophthalmic solution Place 1 drop into both eyes 4 (four) times daily as needed for dry eyes.   Yes [provider]  Multiple Vitamins-Minerals (MULTIVITAMIN ADULTS) TABS Take 0.5 tablets by mouth daily.   Yes [provider]  nitroGLYCERIN (NITROSTAT) 0.4 MG SL tablet Place 1 tablet (0.4 mg total) under the tongue every 5 (five) minutes x 3 doses as needed for chest pain. 11/30/17  Yes Charolette Forward, MD  omeprazole (PRILOSEC) 40 MG capsule Take 40 mg by mouth daily.   Yes [provider]  potassium chloride (K-DUR) 10 MEQ tablet Take 10 mEq by mouth daily. 05/26/18  Yes [provider]  QUEtiapine (SEROQUEL) 25 MG tablet TAKE 1/2 TABLET AT BEDTIME BY MOUTH 03/20/18  Yes Cameron Sprang, MD  carvedilol (COREG) 3.125 MG tablet Take 1 tablet (3.125 mg total) by mouth 2 (two) times daily with a meal. Patient not taking: Reported on 02/11/2018 11/30/17   Charolette Forward, MD  cephALEXin (KEFLEX) 500 MG capsule Take 1 capsule (500 mg total) by mouth 3 (three)  times daily. Patient not taking: Reported on 06/27/2018 02/11/18   Horton, Barbette Hair, MD  pantoprazole (PROTONIX) 20 MG tablet Take 1 tablet (20 mg total) by mouth daily. Patient not taking: Reported on 06/27/2018 11/30/17 11/30/18  Charolette Forward, MD  ramipril (ALTACE) 5 MG capsule Take 1 capsule (5 mg total) by mouth daily. Patient not taking: Reported on 02/11/2018 12/01/17   Charolette Forward, MD    Allergies Sulfa antibiotics; Codeine; and Peanut-containing drug products   REVIEW OF SYSTEMS     PHYSICAL EXAMINATION  Initial Vital Signs SpO2 98 %.  Examination General: Well-developed, well-nourished female in no acute distress; appearance consistent with age of record HENT: normocephalic; atraumatic Eyes: pupils equal, round and sluggish; extraocular muscles intact; arcus senilis bilaterally; cataracts bilaterally Neck: supple Heart: regular rate and rhythm Lungs: clear to auscultation bilaterally Abdomen: soft; nondistended; lower abdominal tenderness; no masses or hepatosplenomegaly; bowel sounds present Rectal: Soft stool high in vault, brown and heme positive Extremities: No deformity; full range of motion; trace edema of lower legs Neurologic: Awake, alert and oriented x 2; motor function intact in all extremities and symmetric; no facial droop Skin: Warm and dry Psychiatric: Normal mood and affect   RESULTS  Summary of this visit's results, reviewed by myself:   EKG Interpretation  Date/Time:    Ventricular Rate:    PR Interval:    QRS Duration:   QT Interval:    QTC Calculation:   R Axis:     Text Interpretation:        Laboratory Studies: Results for orders placed or performed during the hospital encounter of 06/26/18 (from the past 24 hour(s))  POC occult blood, ED     Status: Abnormal   Collection Time: 06/27/18 12:15 AM  Result Value Ref Range   Fecal Occult Bld POSITIVE (A) NEGATIVE  Basic metabolic panel     Status: Abnormal   Collection Time:  06/27/18 12:21 AM  Result Value Ref Range   Sodium 144 135 - 145 mmol/L   Potassium 4.4 3.5 - 5.1 mmol/L   Chloride 101 98 - 111 mmol/L   CO2 30 22 - 32 mmol/L   Glucose, Bld 148 (H) 70 - 99 mg/dL   BUN 72 (H) 8 - 23 mg/dL   Creatinine, Ser 2.02 (H) 0.44 - 1.00 mg/dL   Calcium 10.0 8.9 - 10.3 mg/dL   GFR calc non Af Amer 22 (L) >60 mL/min   GFR calc Af Amer 26 (L) >60 mL/min   Anion gap 13 5 - 15  CBC with Differential/Platelet     Status: Abnormal   Collection Time: 06/27/18 12:21 AM  Result Value Ref Range   WBC 6.9 4.0 - 10.5 K/uL   RBC 3.03 (L) 3.87 - 5.11 MIL/uL   Hemoglobin 9.7 (L) 12.0 - 15.0 g/dL   HCT 29.2 (L) 36.0 -  46.0 %   MCV 96.4 78.0 - 100.0 fL   MCH 32.0 26.0 - 34.0 pg   MCHC 33.2 30.0 - 36.0 g/dL   RDW 14.9 11.5 - 15.5 %   Platelets 186 150 - 400 K/uL   Neutrophils Relative % 77 %   Neutro Abs 5.3 1.7 - 7.7 K/uL   Lymphocytes Relative 13 %   Lymphs Abs 0.9 0.7 - 4.0 K/uL   Monocytes Relative 8 %   Monocytes Absolute 0.6 0.1 - 1.0 K/uL   Eosinophils Relative 2 %   Eosinophils Absolute 0.1 0.0 - 0.7 K/uL   Basophils Relative 0 %   Basophils Absolute 0.0 0.0 - 0.1 K/uL  Protime-INR     Status: None   Collection Time: 06/27/18 12:21 AM  Result Value Ref Range   Prothrombin Time 13.5 11.4 - 15.2 seconds   INR 1.04   APTT     Status: None   Collection Time: 06/27/18 12:21 AM  Result Value Ref Range   aPTT 29 24 - 36 seconds   Imaging Studies: Ct Abdomen Pelvis Wo Contrast  Result Date: 06/27/2018 CLINICAL DATA:  Abdominal pain. Stated history of Crohn's exacerbation. EXAM: CT ABDOMEN AND PELVIS WITHOUT CONTRAST TECHNIQUE: Multidetector CT imaging of the abdomen and pelvis was performed following the standard protocol without IV contrast. COMPARISON:  05/18/2017 FINDINGS: Lower chest: Bibasilar atelectasis, left greater than right. Hepatobiliary: Small low-density hepatic lesions on prior exam not well seen in the absence of IV contrast. Gallbladder  physiologically distended, no calcified stone. No biliary dilatation. Pancreas: No evidence of pancreatic inflammation, more detailed evaluation limited due to paucity of intra-abdominal fat and lack of IV contrast. Spleen: Normal in size. Adrenals/Urinary Tract: No visualized adrenal nodule. Mild right hydronephrosis. No evidence of obstructing stone, however the ureter is not well visualized. Nonobstructing stones in the upper right kidney. Nonobstructing stones in the left kidney. No left hydronephrosis. Left renal cyst again seen. Left ureter not well visualized. Urinary bladder is physiologically distended. No bladder stone. Stomach/Bowel: Paucity of intra-abdominal fat limits detailed bowel assessment. Stomach is physiologically distended without gastric wall thickening. No evidence of small bowel inflammation or wall thickening to suggest active Crohn's. No obstruction with enteric contrast reaching the transverse colon. Large volume of stool throughout the colon. Moderate stool in the rectum with associated rectal wall thickening. Mild perirectal edema. No perirectal fluid collection. No other bowel inflammation. Vascular/Lymphatic: Aorta bi-iliac atherosclerosis. Limited assessment for adenopathy. Reproductive: Uterus grossly normal.  No evidence of adnexal mass. Other: No free air or free fluid. No evidence of intra-abdominal abscess. Musculoskeletal: There are no acute or suspicious osseous abnormalities. IMPRESSION: 1. Large colonic stool burden including stool distending the rectum. Associated rectal wall thickening and perirectal edema, likely due to stercoral disease, less likely acute Crohn's exacerbation. 2. No other bowel inflammation to suggest active Crohn's. 3. Mild right hydronephrosis without identified cause. No definite ureteral calculi. There are nonobstructing stones in both kidneys. 4.  Aortic Atherosclerosis (ICD10-I70.0). Electronically Signed   By: Keith Rake M.D.   On:  06/27/2018 04:22    ED COURSE and MDM  Nursing notes and initial vitals signs, including pulse oximetry, reviewed.  Vitals:   06/27/18 0210 06/27/18 0230 06/27/18 0300 06/27/18 0345  BP: 121/63 (!) 143/66 (!) 126/59 (!) 141/79  Pulse: 68 70 65 62  Resp: 18     SpO2: 100% 100% 100% 100%  Weight:      Height:       4:47  AM We had planned to give the patient a soapsuds enema to clear her fecal impaction but she had a large, nonbloody, bowel movement on her own and now feels significantly improved.  Her abdomen is soft and no longer tender.  PROCEDURES    ED DIAGNOSES     ICD-10-CM   1. Fecal impaction in rectum ([place]) K56.41        Lugene Hitt, Jenny Reichmann, MD 06/27/18 [phone]

## 2018-06-27 ENCOUNTER — Emergency Department (HOSPITAL_COMMUNITY): Payer: Medicare Other

## 2018-06-27 DIAGNOSIS — K5641 Fecal impaction: Secondary | ICD-10-CM | POA: Diagnosis not present

## 2018-06-27 LAB — BASIC METABOLIC PANEL
Anion gap: 13 (ref 5–15)
BUN: 72 mg/dL — ABNORMAL HIGH (ref 8–23)
CHLORIDE: 101 mmol/L (ref 98–111)
CO2: 30 mmol/L (ref 22–32)
Calcium: 10 mg/dL (ref 8.9–10.3)
Creatinine, Ser: 2.02 mg/dL — ABNORMAL HIGH (ref 0.44–1.00)
GFR calc non Af Amer: 22 mL/min — ABNORMAL LOW (ref >60)
GFR, EST AFRICAN AMERICAN: 26 mL/min — AB (ref >60)
Glucose, Bld: 148 mg/dL — ABNORMAL HIGH (ref 70–99)
POTASSIUM: 4.4 mmol/L (ref 3.5–5.1)
SODIUM: 144 mmol/L (ref 135–145)

## 2018-06-27 LAB — CBC WITH DIFFERENTIAL/PLATELET
Basophils Absolute: 0 10*3/uL (ref 0.0–0.1)
Basophils Relative: 0 %
Eosinophils Absolute: 0.1 10*3/uL (ref 0.0–0.7)
Eosinophils Relative: 2 %
HCT: 29.2 % — ABNORMAL LOW (ref 36.0–46.0)
Hemoglobin: 9.7 g/dL — ABNORMAL LOW (ref 12.0–15.0)
LYMPHS ABS: 0.9 10*3/uL (ref 0.7–4.0)
LYMPHS PCT: 13 %
MCH: 32 pg (ref 26.0–34.0)
MCHC: 33.2 g/dL (ref 30.0–36.0)
MCV: 96.4 fL (ref 78.0–100.0)
Monocytes Absolute: 0.6 10*3/uL (ref 0.1–1.0)
Monocytes Relative: 8 %
NEUTROS ABS: 5.3 10*3/uL (ref 1.7–7.7)
NEUTROS PCT: 77 %
Platelets: 186 10*3/uL (ref 150–400)
RBC: 3.03 MIL/uL — AB (ref 3.87–5.11)
RDW: 14.9 % (ref 11.5–15.5)
WBC: 6.9 10*3/uL (ref 4.0–10.5)

## 2018-06-27 LAB — POC OCCULT BLOOD, ED: FECAL OCCULT BLD: POSITIVE — AB

## 2018-06-27 LAB — PROTIME-INR
INR: 1.04
Prothrombin Time: 13.5 seconds (ref 11.4–15.2)

## 2018-06-27 LAB — APTT: aPTT: 29 seconds (ref 24–36)

## 2018-06-27 MED ORDER — IOPAMIDOL (ISOVUE-300) INJECTION 61%: 30.0000 mL | Freq: Once | INTRAVENOUS | Status: DC | PRN

## 2018-06-27 NOTE — ED Notes (Signed)
Bed: WA09 Expected date:  Expected time:  Means of arrival:  Comments: Room 7

## 2018-08-11 ENCOUNTER — Ambulatory Visit: Payer: Medicare Other | Admitting: Neurology

## 2018-08-11 ENCOUNTER — Telehealth: Payer: Self-pay | Admitting: Neurology

## 2018-08-11 MED ORDER — ARIPIPRAZOLE 5 MG PO TABS: 5.0000 mg | ORAL_TABLET | Freq: Every day | ORAL | 0 refills | Status: DC

## 2018-08-11 MED ORDER — QUETIAPINE FUMARATE 25 MG PO TABS
ORAL_TABLET | ORAL | 0 refills | Status: DC
Start: 2018-08-11 — End: 2019-02-19

## 2018-08-11 NOTE — Telephone Encounter (Signed)
There is a slot available 11/04 @ 11:30AM.  I am not sure if this will work for the family or not.  But it's the only time I can work her back in.   Abilify 17m #30 with no refills Sig = take 1 tab daily   Seroquel 254m#45 with no refills Sig = Take half tab each night at bedtime  Sent to [facility] on [address] in [place]

## 2018-08-11 NOTE — Telephone Encounter (Signed)
Patient arrived late to her appt. They got lost in the building and were having a difficult time getting her ready today. Her family did not want her to wait until June to be seen. Is there a work in spot for this patient to reschedule? Also they wanted a refill on her prescriptions-Abilify and Seroquel please. [facility] [place]. Please call them back at [phone] regardig appt or let us know. Thanks!

## 2018-08-18 ENCOUNTER — Ambulatory Visit: Payer: Medicare Other | Admitting: Neurology

## 2019-02-12 ENCOUNTER — Other Ambulatory Visit (HOSPITAL_COMMUNITY)
Admission: AD | Admit: 2019-02-12 | Discharge: 2019-02-12 | Disposition: A | Discharge status: Home / Self Care | Payer: Medicare Other | Source: Ambulatory Visit | Attending: Cardiology | Admitting: Cardiology

## 2019-02-12 DIAGNOSIS — I2510 Atherosclerotic heart disease of native coronary artery without angina pectoris: Secondary | ICD-10-CM | POA: Diagnosis not present

## 2019-02-12 DIAGNOSIS — I10 Essential (primary) hypertension: Secondary | ICD-10-CM | POA: Diagnosis present

## 2019-02-12 LAB — BASIC METABOLIC PANEL
Anion gap: 8 (ref 5–15)
BUN: 22 mg/dL (ref 8–23)
CO2: 22 mmol/L (ref 22–32)
Calcium: 8.8 mg/dL — ABNORMAL LOW (ref 8.9–10.3)
Chloride: 113 mmol/L — ABNORMAL HIGH (ref 98–111)
Creatinine, Ser: 1.51 mg/dL — ABNORMAL HIGH (ref 0.44–1.00)
GFR calc Af Amer: 37 mL/min — ABNORMAL LOW (ref >60)
GFR calc non Af Amer: 32 mL/min — ABNORMAL LOW (ref >60)
Glucose, Bld: 101 mg/dL — ABNORMAL HIGH (ref 70–99)
Potassium: 3.4 mmol/L — ABNORMAL LOW (ref 3.5–5.1)
Sodium: 143 mmol/L (ref 135–145)

## 2019-02-19 ENCOUNTER — Other Ambulatory Visit: Payer: Self-pay | Admitting: Neurology

## 2019-04-06 ENCOUNTER — Encounter

## 2019-04-06 ENCOUNTER — Ambulatory Visit: Payer: Medicare Other | Admitting: Neurology

## 2019-04-07 ENCOUNTER — Telehealth (INDEPENDENT_AMBULATORY_CARE_PROVIDER_SITE_OTHER): Payer: Medicare Other | Admitting: Neurology

## 2019-04-07 ENCOUNTER — Other Ambulatory Visit: Payer: Self-pay

## 2019-04-07 DIAGNOSIS — F0281 Dementia in other diseases classified elsewhere with behavioral disturbance: Secondary | ICD-10-CM

## 2019-04-07 DIAGNOSIS — G301 Alzheimer's disease with late onset: Secondary | ICD-10-CM

## 2019-04-07 MED ORDER — ARIPIPRAZOLE 5 MG PO TABS: 5.0000 mg | ORAL_TABLET | Freq: Every day | ORAL | 3 refills | Status: DC

## 2019-04-07 MED ORDER — QUETIAPINE FUMARATE 25 MG PO TABS: ORAL_TABLET | ORAL | 3 refills | Status: DC

## 2019-04-07 NOTE — Progress Notes (Signed)
Virtual Visit via Video Note The purpose of this virtual visit is to provide medical care while limiting exposure to the novel coronavirus.    Consent was obtained for video visit:  Yes.   Answered questions that patient had about telehealth interaction:  Yes.   I discussed the limitations, risks, security and privacy concerns of performing an evaluation and management service by telemedicine. I also discussed with the patient that there may be a patient responsible charge related to this service. The patient expressed understanding and agreed to proceed.  Pt location: Home Physician Location: office Name of referring provider:  Myatt, Marissa Calamity, MD I connected with [name] at patients initiation/request on 04/07/2019 at  3:30 PM EDT by video enabled telemedicine application and verified that I am speaking with the correct person using two identifiers. Pt MRN:  [id number] Pt DOB:  [birth year] Video Participants:  [name];  Joanette Gula (son)   History of Present Illness:  The patient was last seen more than a year ago for moderate dementia likely secondary to Alzheimer's disease with behavioral disturbance. Her son is present during the e-visit but appears unable to speak freely as the patient gets visibly upset when he says anything bad. She states her year was "excellent," she states she is eating and sleeping well. She feels her memory is doing well. When Percell Miller reports that her equilibrium has been a little off, she gets upset and swats at him to be quiet. She lives with her other son [name], and Percell Miller comes very few days from [place] to help out. She is on Abilify 31m daily and Seroquel 12.531mqhs which help very well with behaviors according to [place] feeds herself 85% of the time. JoRoderic Palauelps with dressing and bathing, administers medications and manages finances.   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 She underwent Neuropsychological evaluation a few months ago, history will be briefly reviewed for our records. Cognitive and behavioral changes started around 3-4 years ago. She would think someone was taking items when she could not find them. She started getting more agitated and paranoid, thinking [place] son from [place] taking things from [place] house. Around 2 years ago, her son killed 2 snakes in her property and a small lizard got into the house. She got increasingly paranoid that there were multiple lizards in the house. She also started to think there were people outside her house and called the polices several times. She was seeing animals and people inside her house. At one point she thought the heating technician installed listening devices in her house. She was brought to the ER in 06/2017 for visual hallucinations and paranoid delusions. Bloodwork was unremarkable. I personally reviewed head CT without contrast which showed diffuse atrophy and chronic microvascular disease, remote left parieto-occipital infarct with encephalomalacia. She called EMS in 08/2017 and was brought to the ER for delusions of parasite infestation. She was IVC'd and admitted to [facility] for 6 days where Abilify was started. She was back in the ER in 09/2017 when she did not recognize her husband or herself in the mirror, thinking there was a women trying to get into the bed with her (it was her husband). Abilify dose was increased and Seroquel started. Her family reports that these 2 medications have helped a lot. They do not  want the word "psychosis" mentioned to the patient, and ask that they be calmed "calming medications." She was in the ER in 11/2017 for chest discomfort and weakness and diagnosed with Takatsubo cardiomyopathy. Seroquel and Abilify were stopped, however she was back a  few days later for agitation and medications were restarted.   Her neuropsychological evaluation indicated Moderate dementia most likely secondary to Alzheimer's disease, with behavioral disturbance. Due to moderate stage, it is more difficult to ascertain etiology of dementia due to global nature of impairment, however based on weaknesses on testing, Alzheimer's is most likely. Other etiologies such as LBD and FTD-bv were considered, but she does not have any signs of parkinsonism. There is no evidence of an underlying psychiatric disorder, delusions and hallucinations are considered neuropsychiatric features directly related to her moderate stage dementia.  She does not drive. Her sons help manage her and her husband's medications. Bills are on autopay. She is able to bathe and dress independently. She reports she is forgetful at times, forgetting where she put things. She has occasional dizziness with positional changes. She has occasional low back pain. Otherwise she denies any headaches, dizziness, diplopia, dysarthria/dysphagia, neck pain, focal numbness/tingling/weakness, bowel/bladder dysfunction, anosmia, or tremors.      Current Outpatient Medications on File Prior to Visit  Medication Sig Dispense Refill  . ARIPiprazole (ABILIFY) 5 MG tablet Take 1 tablet (5 mg total) by mouth daily. 30 tablet 0  . atorvastatin (LIPITOR) 40 MG tablet Take 1 tablet (40 mg total) by mouth daily at 6 PM. (Patient taking differently: Take 20 mg by mouth daily at 6 PM. ) 30 tablet 3  . carvedilol (COREG) 6.25 MG tablet Take 6.25 mg by mouth 2 (two) times daily with a meal.    . clopidogrel (PLAVIX) 75 MG tablet Take 1 tablet (75 mg total) by mouth daily. 30 tablet 3  . furosemide (LASIX) 20 MG tablet Take 20 mg by mouth daily.   3  . hydroxypropyl methylcellulose (ISOPTO TEARS) 2.5 % ophthalmic solution Place 1 drop into both eyes 4 (four) times daily as needed for dry eyes.    . Multiple Vitamins-Minerals  (MULTIVITAMIN ADULTS) TABS Take 0.5 tablets by mouth daily.    Marland Kitchen omeprazole (PRILOSEC) 20 MG capsule Take 20 mg by mouth daily.     . potassium chloride (K-DUR) 10 MEQ tablet Take 10 mEq by mouth daily.  3  . QUEtiapine (SEROQUEL) 25 MG tablet TAKE HALF TABLET BY MOUTH AT BEDTIME 45 tablet 0  . nitroGLYCERIN (NITROSTAT) 0.4 MG SL tablet Place 1 tablet (0.4 mg total) under the tongue every 5 (five) minutes x 3 doses as needed for chest pain. (Patient not taking: Reported on 04/07/2019) 25 tablet 12   No current facility-administered medications on file prior to visit.      Observations/Objective:   GEN:  The patient appears stated age and is in NAD.  Neurological examination: Patient is awake, alert, oriented to person, place. She answers favorably to all questions and visibly gets upset when something out of normal is brought up by son. She has difficulties following commands during EOM testing. She is able to lift arms above gravity, difficulty following command to touch nose. Unable to do MMSE due to difficulty following commands.   Assessment and Plan:   This is a pleasant 84 yo RH woman with a history of hypertension, hyperlipidemia, CAD s/p MI, with Moderate dementia likely due to Alzheimer's disease with behavioral disturbance consisting of paranoia and hallucinations. Her son  reports she is doing well with Abilify 59m daily and Seroquel 12.553mqhs, however he is noted to be unable to speak freely during the visit with the patient getting upset. Refills sent. Continue 24/7 care. She does not drive. Follow-up in 6 months, family knows to call for any changes.    Follow Up Instructions:    -I discussed the assessment and treatment plan with the patient/son. The patient/son were provided an opportunity to ask questions and all were answered. The patient/son agreed with the plan and demonstrated an understanding of the instructions.   The patient/son were advised to call back or seek an  in-person evaluation if the symptoms worsen or if the condition fails to improve as anticipated.    KaCameron SprangMD

## 2019-07-19 ENCOUNTER — Other Ambulatory Visit: Payer: Self-pay

## 2019-07-19 ENCOUNTER — Emergency Department (HOSPITAL_COMMUNITY)
Admission: EM | Admit: 2019-07-19 | Discharge: 2019-07-19 | Disposition: A | Discharge status: Home / Self Care | Payer: Medicare Other | Attending: Emergency Medicine | Admitting: Emergency Medicine

## 2019-07-19 ENCOUNTER — Encounter (HOSPITAL_COMMUNITY): Payer: Self-pay | Admitting: *Deleted

## 2019-07-19 ENCOUNTER — Emergency Department (HOSPITAL_COMMUNITY): Payer: Medicare Other

## 2019-07-19 DIAGNOSIS — R609 Edema, unspecified: Secondary | ICD-10-CM | POA: Diagnosis not present

## 2019-07-19 DIAGNOSIS — I129 Hypertensive chronic kidney disease with stage 1 through stage 4 chronic kidney disease, or unspecified chronic kidney disease: Secondary | ICD-10-CM | POA: Insufficient documentation

## 2019-07-19 DIAGNOSIS — Z20828 Contact with and (suspected) exposure to other viral communicable diseases: Secondary | ICD-10-CM | POA: Insufficient documentation

## 2019-07-19 DIAGNOSIS — Z91010 Allergy to peanuts: Secondary | ICD-10-CM | POA: Diagnosis not present

## 2019-07-19 DIAGNOSIS — F0281 Dementia in other diseases classified elsewhere with behavioral disturbance: Secondary | ICD-10-CM | POA: Diagnosis not present

## 2019-07-19 DIAGNOSIS — Z7901 Long term (current) use of anticoagulants: Secondary | ICD-10-CM | POA: Diagnosis not present

## 2019-07-19 DIAGNOSIS — G301 Alzheimer's disease with late onset: Secondary | ICD-10-CM | POA: Insufficient documentation

## 2019-07-19 DIAGNOSIS — M7989 Other specified soft tissue disorders: Secondary | ICD-10-CM | POA: Diagnosis present

## 2019-07-19 DIAGNOSIS — I252 Old myocardial infarction: Secondary | ICD-10-CM | POA: Insufficient documentation

## 2019-07-19 DIAGNOSIS — Z79899 Other long term (current) drug therapy: Secondary | ICD-10-CM | POA: Diagnosis not present

## 2019-07-19 DIAGNOSIS — Z8673 Personal history of transient ischemic attack (TIA), and cerebral infarction without residual deficits: Secondary | ICD-10-CM | POA: Diagnosis not present

## 2019-07-19 DIAGNOSIS — R600 Localized edema: Secondary | ICD-10-CM

## 2019-07-19 DIAGNOSIS — N184 Chronic kidney disease, stage 4 (severe): Secondary | ICD-10-CM | POA: Diagnosis not present

## 2019-07-19 LAB — CBC
HCT: 32.8 % — ABNORMAL LOW (ref 36.0–46.0)
Hemoglobin: 10.7 g/dL — ABNORMAL LOW (ref 12.0–15.0)
MCH: 32.7 pg (ref 26.0–34.0)
MCHC: 32.6 g/dL (ref 30.0–36.0)
MCV: 100.3 fL — ABNORMAL HIGH (ref 80.0–100.0)
Platelets: 121 10*3/uL — ABNORMAL LOW (ref 150–400)
RBC: 3.27 MIL/uL — ABNORMAL LOW (ref 3.87–5.11)
RDW: 15.9 % — ABNORMAL HIGH (ref 11.5–15.5)
WBC: 7 10*3/uL (ref 4.0–10.5)
nRBC: 0 % (ref 0.0–0.2)

## 2019-07-19 LAB — BRAIN NATRIURETIC PEPTIDE: B Natriuretic Peptide: 96 pg/mL (ref 0.0–100.0)

## 2019-07-19 LAB — BASIC METABOLIC PANEL
Anion gap: 10 (ref 5–15)
BUN: 21 mg/dL (ref 8–23)
CO2: 23 mmol/L (ref 22–32)
Calcium: 9.2 mg/dL (ref 8.9–10.3)
Chloride: 107 mmol/L (ref 98–111)
Creatinine, Ser: 1.29 mg/dL — ABNORMAL HIGH (ref 0.44–1.00)
GFR calc Af Amer: 45 mL/min — ABNORMAL LOW (ref >60)
GFR calc non Af Amer: 39 mL/min — ABNORMAL LOW (ref >60)
Glucose, Bld: 93 mg/dL (ref 70–99)
Potassium: 3.5 mmol/L (ref 3.5–5.1)
Sodium: 140 mmol/L (ref 135–145)

## 2019-07-19 MED ORDER — BACITRACIN ZINC 500 UNIT/GM EX OINT: 1.0000 "application " | TOPICAL_OINTMENT | Freq: Two times a day (BID) | CUTANEOUS | 0 refills | Status: AC

## 2019-07-19 MED ORDER — CEPHALEXIN 500 MG PO CAPS: 500.0000 mg | ORAL_CAPSULE | Freq: Four times a day (QID) | ORAL | 0 refills | Status: DC

## 2019-07-19 MED ORDER — CEPHALEXIN 500 MG PO CAPS: 500.0000 mg | ORAL_CAPSULE | Freq: Two times a day (BID) | ORAL | 0 refills | Status: AC

## 2019-07-19 NOTE — ED Provider Notes (Signed)
[facility] EMERGENCY DEPARTMENT Provider Note   CSN: [id number] Arrival date & time: 07/19/19  1618     History   Chief Complaint Chief Complaint  Patient presents with  . Leg Swelling    HPI [name] is a 84 y.o. female past medical history of Takotsubo cardiomyopathy, hypertension, hyperlipidemia, anemia, presenting to the emergency department with complaint of leg swelling.  Patient has had chronic issues of leg swelling her lower extremities.  Her son at the bedside reports the leg swelling worsened after they had her Lasix dose reduced from 40 mg to 20 mg daily.  He noted she developed some water blisters in her bilateral lower extremities 1 of which popped and drained clear serous fluid.  She reports a little bit more pain and discomfort in her legs near the blisters.  She denies any fevers or chills.  She denies any purulent drainage.  She denies any numbness in her feet.  Denies any redness streaking up her legs.  She otherwise denies any orthopnea, shortness of breath, dyspnea on exertion.  She denies any chest pain or palpitations  They report that their cardiologist increased her Lasix dose back to 40 mg once daily yesterday which they began.  They report they came to the ER today because Dr. Agapito Games cardiologist advised him to come in to get her kidney function checked, as well as"some blood work".     HPI  Past Medical History:  Diagnosis Date  . Acute lateral wall myocardial infarction ([place]) 11/28/2017  . Alzheimer disease ([place])   . Crohn disease ([place])   . History of kidney stones   . Hypertension   . TIA (transient ischemic attack) 01/2014   hx/notes 02/06/2014    Patient Active Problem List   Diagnosis Date Noted  . Late onset Alzheimer's disease with behavioral disturbance ([place]) 01/27/2018  . Acute MI, lateral wall ([place]) 11/28/2017  . Brief psychotic disorder ([place]) 09/06/2017  . Cognitive impairment 09/02/2017  . Psychosis ([place])  08/31/2017  . Paranoid delusion ([place]) 06/26/2017  . CKD (chronic kidney disease) stage 4, GFR 15-29 ml/min (HCC) 01/31/2017  . Palpitations 08/29/2016  . TIA (transient ischemic attack) 02/02/2014    Past Surgical History:  Procedure Laterality Date  . CARDIAC CATHETERIZATION  11/28/2017  . COLECTOMY    . DILATION AND CURETTAGE OF UTERUS    . ESOPHAGOGASTRODUODENOSCOPY  10/2008   Archie Endo 02/14/2011  . LAPAROSCOPIC RIGHT HEMI COLECTOMY  1970s   Archie Endo 5/2/012  . LEFT HEART CATH AND CORONARY ANGIOGRAPHY N/A 11/28/2017   Procedure: LEFT HEART CATH AND CORONARY ANGIOGRAPHY;  Surgeon: Charolette Forward, MD;  Location: [facility];  Service: Cardiovascular;  Laterality: N/A;  . TONSILLECTOMY       OB History   No obstetric history on file.      Home Medications    Prior to Admission medications   Medication Sig Start Date End Date Taking? Authorizing Provider  ARIPiprazole (ABILIFY) 5 MG tablet Take 1 tablet (5 mg total) by mouth daily. 04/07/19   Cameron Sprang, MD  atorvastatin (LIPITOR) 40 MG tablet Take 1 tablet (40 mg total) by mouth daily at 6 PM. Patient taking differently: Take 20 mg by mouth daily at 6 PM.  11/30/17   Charolette Forward, MD  bacitracin ointment Apply 1 application topically 2 (two) times daily for 7 days. 07/19/19 07/26/19  Wyvonnia Dusky, MD  carvedilol (COREG) 6.25 MG tablet Take 6.25 mg by mouth 2 (two) times  daily with a meal.    [provider]  cephALEXin (KEFLEX) 500 MG capsule Take 1 capsule (500 mg total) by mouth 2 (two) times daily for 5 days. 07/19/19 07/24/19  Wyvonnia Dusky, MD  clopidogrel (PLAVIX) 75 MG tablet Take 1 tablet (75 mg total) by mouth daily. 12/01/17   Charolette Forward, MD  furosemide (LASIX) 20 MG tablet Take 20 mg by mouth daily.  05/26/18   [provider]  hydroxypropyl methylcellulose (ISOPTO TEARS) 2.5 % ophthalmic solution Place 1 drop into both eyes 4 (four) times daily as needed for dry eyes.    [provider]  Multiple Vitamins-Minerals (MULTIVITAMIN ADULTS) TABS Take 0.5 tablets by mouth daily.    [provider]  nitroGLYCERIN (NITROSTAT) 0.4 MG SL tablet Place 1 tablet (0.4 mg total) under the tongue every 5 (five) minutes x 3 doses as needed for chest pain. Patient not taking: Reported on 04/07/2019 11/30/17   Charolette Forward, MD  omeprazole (PRILOSEC) 20 MG capsule Take 20 mg by mouth daily.     [provider]  potassium chloride (K-DUR) 10 MEQ tablet Take 10 mEq by mouth daily. 05/26/18   [provider]  QUEtiapine (SEROQUEL) 25 MG tablet TAKE HALF TABLET BY MOUTH AT BEDTIME 04/07/19   Cameron Sprang, MD    Family History No family history on file.  Social History Social History   Tobacco Use  . Smoking status: Never Smoker  . Smokeless tobacco: Never Used  Substance Use Topics  . Alcohol use: No  . Drug use: No     Allergies   Sulfa antibiotics, Codeine, and Peanut-containing drug products   Review of Systems Review of Systems  Constitutional: Negative for chills and fever.  Eyes: Negative for photophobia and visual disturbance.  Respiratory: Negative for cough and shortness of breath.   Gastrointestinal: Negative for nausea and vomiting.  Musculoskeletal: Positive for myalgias.  Skin: Negative for pallor and rash.  Neurological: Negative for syncope and weakness.  Psychiatric/Behavioral: Negative for agitation and confusion.  All other systems reviewed and are negative.    Physical Exam Updated Vital Signs BP (!) 176/87   Pulse 62   Temp 97.9 F (36.6 C) (Oral)   Resp 16   SpO2 100%   Physical Exam Vitals signs and nursing note reviewed.  Constitutional:      General: She is not in acute distress.    Appearance: She is well-developed.  HENT:     Head: Normocephalic and atraumatic.  Eyes:     Conjunctiva/sclera: Conjunctivae normal.  Neck:     Musculoskeletal: Neck supple.  Cardiovascular:     Rate and Rhythm:  Normal rate and regular rhythm.     Pulses: Normal pulses.  Pulmonary:     Effort: Pulmonary effort is normal. No respiratory distress.     Breath sounds: Normal breath sounds. No wheezing or rhonchi.  Abdominal:     General: Abdomen is flat.     Palpations: Abdomen is soft.     Tenderness: There is no abdominal tenderness.  Musculoskeletal:     Comments: Symmetrical brawny edema of the bilateral lower extremities Small water blisters noted on bilateral lower legs 2 cm circular lesion on left leg with popped blister, pink granulation tissue beneath No erythema or warmth of the lower extremities  Skin:    General: Skin is warm and dry.  Neurological:     Mental Status: She is alert.    ED Treatments / Results  Labs (all labs ordered are listed, but only abnormal results are displayed) Labs Reviewed  CBC - Abnormal; Notable for the following components:      Result Value   RBC 3.27 (*)    Hemoglobin 10.7 (*)    HCT 32.8 (*)    MCV 100.3 (*)    RDW 15.9 (*)    Platelets 121 (*)    All other components within normal limits  BASIC METABOLIC PANEL - Abnormal; Notable for the following components:   Creatinine, Ser 1.29 (*)    GFR calc non Af Amer 39 (*)    GFR calc Af Amer 45 (*)    All other components within normal limits  SARS CORONAVIRUS 2 (TAT 6-24 HRS)  BRAIN NATRIURETIC PEPTIDE    EKG None  Radiology Dg Chest Portable 1 View  Result Date: 07/19/2019 CLINICAL DATA:  CHF evaluation history of hypertension history of coronary artery disease EXAM: PORTABLE CHEST 1 VIEW COMPARISON:  10/04/2015, 09/26/2017 FINDINGS: Mild cardiomegaly. Normal vascularity. Negative for edema or pleural effusion. No focal airspace disease. Aortic atherosclerosis. No pneumothorax. IMPRESSION: No active disease.  Cardiomegaly. Electronically Signed   By: Donavan Foil M.D.   On: 07/19/2019 19:20    Procedures Procedures (including critical care time)  Medications Ordered in ED Medications  - No data to display   Initial Impression / Assessment and Plan / ED Course  I have reviewed the triage vital signs and the nursing notes.  Pertinent labs & imaging results that were available during my care of the patient were reviewed by me and considered in my medical decision making (see chart for details).  84 yo female here with bilateral brawny LE edema, water blisters on her lower leg, two popped, with clean pink granulation tissue beneath.  No sign of infection on my exam.  No sign of acute CHF exacerbation on my exam. The patient is very pleasant, appears comfortable.  Lungs CTAB.  Labs ordered at intake.  Cr okay compared to baseline.  She recently upped her lasix to 40 mg daily yesterday.  This may help with the leg swelling.  Will reach out to Dr. Dorna Mai as he did refer the patient into the ER today.   This note was dictated using dragon dictation software.  Please be aware that there may be minor translation errors as a result of this oral dictation    Clinical Course as of Jul 18 2042  [name] Jul 19, 2019  1852 I spoke to Dr.  Dorna Mai, who recommends that we add on a BNP as well as a chest x-ray.  He will come evaluate the patient in about 30 minutes.  He is asking for admission COVID swab as well.   [MT]  1925 FINDINGS: Mild cardiomegaly. Normal vascularity. Negative for edema or pleural effusion. No focal airspace disease. Aortic atherosclerosis. No pneumothorax.   [MT]  2007 Dr Dorna Mai has seen patient, okay'ed for discharge home, he recommends bacitracin and keflex to prevent skin infection.   [MT]    Clinical Course User Index [MT] Langston Masker Carola Rhine, MD     Final Clinical Impressions(s) / ED Diagnoses   Final diagnoses:  Leg edema    ED Discharge Orders         Ordered    cephALEXin (KEFLEX) 500 MG capsule  4 times daily,   Status:  Discontinued     07/19/19 2011    bacitracin ointment  2 times daily     07/19/19 2011  cephALEXin (KEFLEX) 500  MG capsule  2 times daily     07/19/19 2020           Wyvonnia Dusky, MD 07/19/19 2043

## 2019-07-19 NOTE — ED Notes (Signed)
Patient verbalizes understanding of discharge instructions. Opportunity for questioning and answers were provided. Armband removed by staff, pt discharged from ED.  

## 2019-07-19 NOTE — ED Triage Notes (Signed)
Pt here with c/o bilateral leg swelling and blisters "for several weeks" denies fevers. No sob or cp

## 2019-07-19 NOTE — Consult Note (Signed)
Referring Physician: [name] is an 84 y.o. female.                       Chief Complaint: Bilateral lower leg blisters andm swelling  HPI: 84 years old black female with PMH of HTN, TIA, Crohn dosease, CAD and Alzheimer disease has bilateral lower leg edema with blisters. No fever or shortness of breath. BNP and chest x-ray are unremarkable. DP and PT pulses are weak but heard easily with doppler.   Past Medical History:  Diagnosis Date  . Acute lateral wall myocardial infarction ([place]) 11/28/2017  . Alzheimer disease ([place])   . Crohn disease ([place])   . History of kidney stones   . Hypertension   . TIA (transient ischemic attack) 01/2014   hx/notes 02/06/2014      Past Surgical History:  Procedure Laterality Date  . CARDIAC CATHETERIZATION  11/28/2017  . COLECTOMY    . DILATION AND CURETTAGE OF UTERUS    . ESOPHAGOGASTRODUODENOSCOPY  10/2008   Archie Endo 02/14/2011  . LAPAROSCOPIC RIGHT HEMI COLECTOMY  1970s   Archie Endo 5/2/012  . LEFT HEART CATH AND CORONARY ANGIOGRAPHY N/A 11/28/2017   Procedure: LEFT HEART CATH AND CORONARY ANGIOGRAPHY;  Surgeon: Charolette Forward, MD;  Location: [facility];  Service: Cardiovascular;  Laterality: N/A;  . TONSILLECTOMY      No family history on file. Social History:  reports that she has never smoked. She has never used smokeless tobacco. She reports that she does not drink alcohol or use drugs.  Allergies:  Allergies  Allergen Reactions  . Sulfa Antibiotics Anaphylaxis and Rash  . Codeine Nausea And Vomiting  . Peanut-Containing Drug Products     (Not in a hospital admission)   Results for orders placed or performed during the hospital encounter of 07/19/19 (from the past 48 hour(s))  CBC     Status: Abnormal   Collection Time: 07/19/19  4:38 PM  Result Value Ref Range   WBC 7.0 4.0 - 10.5 K/uL   RBC 3.27 (L) 3.87 - 5.11 MIL/uL   Hemoglobin 10.7 (L) 12.0 - 15.0 g/dL   HCT 32.8 (L) 36.0 - 46.0 %   MCV 100.3 (H) 80.0 -  100.0 fL   MCH 32.7 26.0 - 34.0 pg   MCHC 32.6 30.0 - 36.0 g/dL   RDW 15.9 (H) 11.5 - 15.5 %   Platelets 121 (L) 150 - 400 K/uL   nRBC 0.0 0.0 - 0.2 %    Comment: Performed at [facility], [address] [place], [place] [address]  Basic metabolic panel     Status: Abnormal   Collection Time: 07/19/19  4:38 PM  Result Value Ref Range   Sodium 140 135 - 145 mmol/L   Potassium 3.5 3.5 - 5.1 mmol/L   Chloride 107 98 - 111 mmol/L   CO2 23 22 - 32 mmol/L   Glucose, Bld 93 70 - 99 mg/dL   BUN 21 8 - 23 mg/dL   Creatinine, Ser 1.29 (H) 0.44 - 1.00 mg/dL   Calcium 9.2 8.9 - 10.3 mg/dL   GFR calc non Af Amer 39 (L) >60 mL/min   GFR calc Af Amer 45 (L) >60 mL/min   Anion gap 10 5 - 15    Comment: Performed at [facility], [place] [address], [place], [place] [address]  Brain natriuretic peptide     Status: None   Collection Time: 07/19/19  4:38  PM  Result Value Ref Range   B Natriuretic Peptide 96.0 0.0 - 100.0 pg/mL    Comment: Performed at [facility] [address], [place], [place] [address]   Dg Chest Portable 1 View  Result Date: 07/19/2019 CLINICAL DATA:  CHF evaluation history of hypertension history of coronary artery disease EXAM: PORTABLE CHEST 1 VIEW COMPARISON:  10/04/2015, 09/26/2017 FINDINGS: Mild cardiomegaly. Normal vascularity. Negative for edema or pleural effusion. No focal airspace disease. Aortic atherosclerosis. No pneumothorax. IMPRESSION: No active disease.  Cardiomegaly. Electronically Signed   By: Donavan Foil M.D.   On: 07/19/2019 19:20    Review Of Systems Constitutional: No fever, chills, chronic weight loss. Eyes: Positive vision change, wears glasses. No discharge or pain. Ears: No hearing loss, No tinnitus. Respiratory: No asthma, COPD, pneumonias. No shortness of breath. No hemoptysis. Cardiovascular: No chest pain, palpitation, positive leg edema. Gastrointestinal: No nausea, vomiting, diarrhea, constipation. No GI bleed. No  hepatitis. Genitourinary: No dysuria, hematuria, kidney stone. No incontinance. Neurological: No headache, positive stroke, seizures.  Psychiatry: No psych facility admission for anxiety, depression, suicide. No detox. Skin: No rash. Musculoskeletal: Positive joint pain, fibromyalgia. No neck pain, back pain. Lymphadenopathy: No lymphadenopathy. Hematology: No anemia or easy bruising.   Blood pressure (!) 182/76, pulse 70, temperature 97.9 F (36.6 C), temperature source Oral, resp. rate 16, SpO2 98 %. There is no height or weight on file to calculate BMI. General appearance: alert, cooperative, appears stated age and no distress Head: Normocephalic, atraumatic. Eyes: Brown eyes, pink conjunctiva, corneas clear. Neck: No adenopathy, no carotid bruit, no JVD, supple, symmetrical, trachea midline and thyroid not enlarged. Chronic stiffness of neck Resp: Clear to auscultation bilaterally. Cardio: Regular rate and rhythm, S1, S2 normal, II/VI systolic murmur, no click, rub or gallop GI: Soft, non-tender; bowel sounds normal; no organomegaly. Extremities: 2 + edema, no cyanosis or clubbing. 2" blisters, perforated, non-tender Skin: cool and dry. DP and PT heard with doppler, bilateral  She also has chronic stiffneaa of joints of legs. Neurologic: Alert and oriented X 3, decreased strength.  Assessment/Plan Bilateral lower leg edema with early cellulitis PVD HTN S/P TIA Severe arthritis  Apply dressing, non-adherering. Keflex 500 mg. Tid x 1-2 weeks. See Primary care for referral to wound care and vascular doctor as needed..  Time spent: Review of old records, Lab, x-rays, EKG, other cardiac tests, examination, discussion with patient over 60 minutes.  Birdie Riddle, MD  07/19/2019, 8:08 PM

## 2019-07-19 NOTE — Discharge Instructions (Signed)
You were prescribed bacitracin ointment for your leg and a course of Keflex antibiotic to prevent skin infection.

## 2019-07-20 LAB — SARS CORONAVIRUS 2 (TAT 6-24 HRS): SARS Coronavirus 2: NEGATIVE

## 2019-10-26 ENCOUNTER — Encounter: Payer: Self-pay | Admitting: Neurology

## 2019-11-13 ENCOUNTER — Telehealth: Payer: Medicare Other | Admitting: Neurology

## 2019-12-02 ENCOUNTER — Other Ambulatory Visit: Payer: Self-pay | Admitting: Neurology

## 2020-01-06 ENCOUNTER — Other Ambulatory Visit: Payer: Self-pay | Admitting: Neurology

## 2020-02-04 ENCOUNTER — Other Ambulatory Visit: Payer: Self-pay | Admitting: Neurology

## 2020-02-29 ENCOUNTER — Other Ambulatory Visit: Payer: Self-pay | Admitting: Neurology

## 2020-03-01 ENCOUNTER — Telehealth: Payer: Self-pay | Admitting: Neurology

## 2020-03-01 ENCOUNTER — Other Ambulatory Visit: Payer: Self-pay

## 2020-03-01 MED ORDER — ARIPIPRAZOLE 5 MG PO TABS: 5.0000 mg | ORAL_TABLET | Freq: Every day | ORAL | 0 refills | Status: DC

## 2020-03-01 NOTE — Telephone Encounter (Signed)
Patient needs Abilify 89m refilled at [facility] on [address] in [place]. She has a follow up appt scheduled 04/12/20.

## 2020-03-01 NOTE — Telephone Encounter (Signed)
She will need a f/u before we send in refills, once we have f/u date, ok to send refills until then, thanks

## 2020-03-01 NOTE — Telephone Encounter (Signed)
Sorry [name], I meant we can send in refills until her next appointment, but will not send for a whole year without seeing her within the next few months. Thanks

## 2020-04-12 ENCOUNTER — Encounter: Payer: Self-pay | Admitting: Neurology

## 2020-04-12 ENCOUNTER — Other Ambulatory Visit: Payer: Self-pay

## 2020-04-12 ENCOUNTER — Telehealth (INDEPENDENT_AMBULATORY_CARE_PROVIDER_SITE_OTHER): Payer: Medicare Other | Admitting: Neurology

## 2020-04-12 VITALS — Ht 63.0 in | Wt 195.0 lb

## 2020-04-12 DIAGNOSIS — F02818 Dementia in other diseases classified elsewhere, unspecified severity, with other behavioral disturbance: Secondary | ICD-10-CM

## 2020-04-12 DIAGNOSIS — F0281 Dementia in other diseases classified elsewhere with behavioral disturbance: Secondary | ICD-10-CM

## 2020-04-12 DIAGNOSIS — G301 Alzheimer's disease with late onset: Secondary | ICD-10-CM | POA: Diagnosis not present

## 2020-04-12 MED ORDER — ARIPIPRAZOLE 5 MG PO TABS: 5.0000 mg | ORAL_TABLET | Freq: Every day | ORAL | 3 refills | Status: DC

## 2020-04-12 MED ORDER — QUETIAPINE FUMARATE 25 MG PO TABS: ORAL_TABLET | ORAL | 3 refills | Status: DC

## 2020-04-12 NOTE — Progress Notes (Signed)
Virtual Visit via Video Note The purpose of this virtual visit is to provide medical care while limiting exposure to the novel coronavirus.    Consent was obtained for video visit:  Yes.   Answered questions that patient had about telehealth interaction:  Yes.   I discussed the limitations, risks, security and privacy concerns of performing an evaluation and management service by telemedicine. I also discussed with the patient that there may be a patient responsible charge related to this service. The patient expressed understanding and agreed to proceed.  Pt location: Home Physician Location: office Name of referring provider:  Myatt, Rhonda Calamity, MD I connected with [name] at patients initiation/request on 04/12/2020 at 10:00 AM EDT by video enabled telemedicine application and verified that I am speaking with the correct person using two identifiers. Pt MRN:  [id number] Pt DOB:  25-Nov-1934 Video Participants:  [name];  [name] (son)   History of Present Illness:  The patient was seen as a virtual video visit on 04/12/2020. 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 Her son [name] is present during the visit to provide additional information. She states she is doing well. [name] reports she has been really quite stable with very slow progression. The hallucinations are well-controlled on Abilify 94m daily and Seroquel 12.568mqhs. She has 24/7 care with her son, daughter-in-law, and an aide coming 5 days a week. Appetite is good. She denies any headaches, dizziness, focal numbness/tingling/weakness. She has had 3 "terrible" falls, last fall was a few months ago with no injuries. JoRoderic Palaueports some days she walks better than others, she is stiff initially but does better once she gets moving. She has some leg swelling last October that improved with increase in Lasix dose. Sleep is overall  good, she takes occasional naps after lunch. She needs assistance with all ADLs, her family manages finances, medications, dressing/bathing.  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 She underwent Neuropsychological evaluation a few months ago, history will be briefly reviewed for our records. Cognitive and behavioral changes started around 3-4 years ago. She would think someone was taking items when she could not find them. She started getting more agitated and paranoid, thinking [place] son from [place] taking things from her house. Around 2 years ago, her son killed 2 snakes in her property and a small lizard got into the house. She got increasingly paranoid that there were multiple lizards in the house. She also started to think there were people outside her house and called the polices several times. She was seeing animals and people inside her house. At one point she thought the heating technician installed listening devices in her house. She was brought to the ER in 06/2017 for visual hallucinations and paranoid delusions. Bloodwork was unremarkable. I personally reviewed head CT without contrast which showed diffuse atrophy and chronic microvascular disease, remote left parieto-occipital infarct with encephalomalacia. She called EMS in 08/2017 and was brought to the ER for delusions of parasite infestation. She was IVC'd and admitted to ger[facility] behavioral health for 6 days where Abilify was started. She was back in the ER in 09/2017 when she did not recognize her husband or herself in the mirror, thinking there was a women trying to get into the bed with her (  it was her husband). Abilify dose was increased and Seroquel started. Her family reports that these 2 medications have helped a lot. They do not want the word "psychosis"  mentioned to the patient, and ask that they be calmed "calming medications." She was in the ER in 11/2017 for chest discomfort and weakness and diagnosed with Takatsubo cardiomyopathy. Seroquel and Abilify were stopped, however she was back a few days later for agitation and medications were restarted.   Her neuropsychological evaluation indicated Moderate dementia most likely secondary to Alzheimer's disease, with behavioral disturbance. Due to moderate stage, it is more difficult to ascertain etiology of dementia due to global nature of impairment, however based on weaknesses on testing, Alzheimer's is most likely. Other etiologies such as LBD and FTD-bv were considered, but she does not have any signs of parkinsonism. There is no evidence of an underlying psychiatric disorder, delusions and hallucinations are considered neuropsychiatric features directly related to her moderate stage dementia.  She does not drive. Her sons help manage her and her husband's medications. Bills are on autopay. She is able to bathe and dress independently. She reports she is forgetful at times, forgetting where she put things. She has occasional dizziness with positional changes. She has occasional low back pain. Otherwise she denies any headaches, dizziness, diplopia, dysarthria/dysphagia, neck pain, focal numbness/tingling/weakness, bowel/bladder dysfunction, anosmia, or tremors.      Current Outpatient Medications on File Prior to Visit  Medication Sig Dispense Refill  . ARIPiprazole (ABILIFY) 5 MG tablet Take 1 tablet (5 mg total) by mouth daily. 30 tablet 0  . atorvastatin (LIPITOR) 40 MG tablet Take 1 tablet (40 mg total) by mouth daily at 6 PM. (Patient taking differently: Take 20 mg by mouth daily at 6 PM. ) 30 tablet 3  . carvedilol (COREG) 6.25 MG tablet Take 6.25 mg by mouth 2 (two) times daily with a meal.    . clopidogrel (PLAVIX) 75 MG tablet Take 1 tablet (75 mg total) by mouth daily. 30 tablet 3  .  furosemide (LASIX) 20 MG tablet Take 20 mg by mouth daily.   3  . hydroxypropyl methylcellulose (ISOPTO TEARS) 2.5 % ophthalmic solution Place 1 drop into both eyes 4 (four) times daily as needed for dry eyes.    . Multiple Vitamins-Minerals (MULTIVITAMIN ADULTS) TABS Take 0.5 tablets by mouth daily.    Marland Kitchen omeprazole (PRILOSEC) 20 MG capsule Take 20 mg by mouth daily.     . potassium chloride (K-DUR) 10 MEQ tablet Take 10 mEq by mouth daily.  3  . QUEtiapine (SEROQUEL) 25 MG tablet TAKE HALF TABLET BY MOUTH AT BEDTIME 45 tablet 3  . nitroGLYCERIN (NITROSTAT) 0.4 MG SL tablet Place 1 tablet (0.4 mg total) under the tongue every 5 (five) minutes x 3 doses as needed for chest pain. (Patient not taking: Reported on 04/12/2020) 25 tablet 12   No current facility-administered medications on file prior to visit.     Observations/Objective:   Vitals:   04/12/20 0808  Weight: 195 lb (88.5 kg)  Height: 5' 3"  (1.6 m)   GEN:  The patient appears stated age and is in NAD.  Neurological examination: Patient is awake, alert, oriented to person. No aphasia or dysarthria. Intact fluency, able to follow simple commands. Remote and recent memory impaired, a few minutes into the visit she needs to be reminded about the video visit and waves and says hello again. Cranial nerves: Extraocular movements intact with no nystagmus. No facial asymmetry. Motor: moves all  extremities symmetrically, at least anti-gravity x 4. No incoordination on finger to nose testing. Gait: slow and cautious, hunched posture, uses cane.    Assessment and Plan:   This is a pleasant 85 yo RH woman with a history of hypertension, hyperlipidemia, CAD s/p MI, with moderate dementia likely due to Alzheimer's disease with behavioral disturbance consisting of paranoia and hallucinations. Her son reports paranoia and hallucinations are very well controlled with current regimen of Abilify 5m daily and Seroquel 12.534mqhs, no side effects. She has  24/7 care. Continue supportive care, follow-up in 1 year, they know to call for any changes.    Follow Up Instructions:   -I discussed the assessment and treatment plan with the patient. The patient was provided an opportunity to ask questions and all were answered. The patient agreed with the plan and demonstrated an understanding of the instructions.   The patient was advised to call back or seek an in-person evaluation if the symptoms worsen or if the condition fails to improve as anticipated.    KaCameron SprangMD

## 2020-05-18 ENCOUNTER — Encounter: Payer: Medicare Other | Admitting: Counselor

## 2020-05-25 ENCOUNTER — Encounter: Payer: Medicare Other | Admitting: Counselor

## 2021-02-07 ENCOUNTER — Other Ambulatory Visit: Payer: Self-pay | Admitting: Neurology

## 2021-02-20 ENCOUNTER — Encounter (HOSPITAL_BASED_OUTPATIENT_CLINIC_OR_DEPARTMENT_OTHER): Payer: Medicare Other | Attending: Internal Medicine | Admitting: Internal Medicine

## 2021-02-20 ENCOUNTER — Other Ambulatory Visit: Payer: Self-pay

## 2021-02-20 ENCOUNTER — Other Ambulatory Visit (HOSPITAL_COMMUNITY)
Admission: RE | Admit: 2021-02-20 | Discharge: 2021-02-20 | Disposition: A | Discharge status: Home / Self Care | Payer: Medicare Other | Attending: Internal Medicine | Admitting: Internal Medicine

## 2021-02-20 DIAGNOSIS — F0280 Dementia in other diseases classified elsewhere without behavioral disturbance: Secondary | ICD-10-CM | POA: Diagnosis not present

## 2021-02-20 DIAGNOSIS — L89229 Pressure ulcer of left hip, unspecified stage: Secondary | ICD-10-CM | POA: Insufficient documentation

## 2021-02-20 DIAGNOSIS — I129 Hypertensive chronic kidney disease with stage 1 through stage 4 chronic kidney disease, or unspecified chronic kidney disease: Secondary | ICD-10-CM | POA: Diagnosis not present

## 2021-02-20 DIAGNOSIS — I10 Essential (primary) hypertension: Secondary | ICD-10-CM

## 2021-02-20 DIAGNOSIS — N189 Chronic kidney disease, unspecified: Secondary | ICD-10-CM

## 2021-02-20 DIAGNOSIS — L89223 Pressure ulcer of left hip, stage 3: Secondary | ICD-10-CM | POA: Insufficient documentation

## 2021-02-20 DIAGNOSIS — N184 Chronic kidney disease, stage 4 (severe): Secondary | ICD-10-CM | POA: Diagnosis not present

## 2021-02-20 DIAGNOSIS — G309 Alzheimer's disease, unspecified: Secondary | ICD-10-CM | POA: Insufficient documentation

## 2021-02-22 NOTE — Progress Notes (Signed)
JODIE, CAVEY ([id number]) Visit Report for 02/20/2021 Allergy List Details Patient Name: Date of Service: [name], [place] [name] 02/20/2021 7:30 A M Medical Record Number: [id number] Patient Account Number: [id number] Date of Birth/Sex: Treating RN: [birth year] (86 y.o. Female) Rhae Hammock Primary Care Peta Peachey: Su Monks Other Clinician: Referring Jeris Easterly: Treating Alta Shober/Extender: Kalman Shan MYA TT, JENNIFER Weeks in Treatment: 0 Allergies Active Allergies Sulfa (Sulfonamide Antibiotics) Allergy Notes Electronic Signature(s) Signed: 02/22/2021 5:14:45 PM By: Rhae Hammock RN Entered By: Rhae Hammock on 02/20/2021 08:04:27 -------------------------------------------------------------------------------- Arrival Information Details Patient Name: Date of Service: [name], [name] 02/20/2021 7:30 A M Medical Record Number: [id number] Patient Account Number: [id number] Date of Birth/Sex: Treating RN: October 03, 1935 (85 y.o. Female) Rhae Hammock Primary Care Peja Allender: Su Monks Other Clinician: Referring Rei Medlen: Treating Ahlivia Salahuddin/Extender: Kalman Shan MYA TT, JENNIFER Weeks in Treatment: 0 Visit Information Patient Arrived: Wheel Chair Arrival Time: 08:01 Accompanied By: son Transfer Assistance: None Patient Identification Verified: Yes Secondary Verification Process Completed: Yes Patient Requires Transmission-Based Precautions: No Patient Has Alerts: No Electronic Signature(s) Signed: 02/22/2021 5:14:45 PM By: Rhae Hammock RN Entered By: Rhae Hammock on 02/20/2021 08:02:06 -------------------------------------------------------------------------------- Clinic Level of Care Assessment Details Patient Name: Date of Service: [name], [name] 02/20/2021 7:30 A M Medical Record Number: [id number] Patient Account Number: [id number] Date of Birth/Sex: Treating RN: 1934/10/22 (86 y.o. Female) Levan Hurst Primary Care Emmi Wertheim:  Su Monks Other Clinician: Referring Sharmane Dame: Treating Akemi Overholser/Extender: Kalman Shan MYA TT, JENNIFER Weeks in Treatment: 0 Clinic Level of Care Assessment Items TOOL 1 Quantity Score X- 1 0 Use when EandM and Procedure is performed on INITIAL visit ASSESSMENTS - Nursing Assessment / Reassessment X- 1 20 General Physical Exam (combine w/ comprehensive assessment (listed just below) when performed on new pt. evals) X- 1 25 Comprehensive Assessment (HX, ROS, Risk Assessments, Wounds Hx, etc.) ASSESSMENTS - Wound and Skin Assessment / Reassessment []  - 0 Dermatologic / Skin Assessment (not related to wound area) ASSESSMENTS - Ostomy and/or Continence Assessment and Care []  - 0 Incontinence Assessment and Management []  - 0 Ostomy Care Assessment and Management (repouching, etc.) PROCESS - Coordination of Care X - Simple Patient / Family Education for ongoing care 1 15 []  - 0 Complex (extensive) Patient / Family Education for ongoing care X- 1 10 Staff obtains Programmer, systems, Records, T Results / Process Orders est X- 1 10 Staff telephones HHA, Nursing Homes / Clarify orders / etc []  - 0 Routine Transfer to another Facility (non-emergent condition) []  - 0 Routine Hospital Admission (non-emergent condition) X- 1 15 New Admissions / Biomedical engineer / Ordering NPWT Apligraf, etc. , []  - 0 Emergency Hospital Admission (emergent condition) PROCESS - Special Needs []  - 0 Pediatric / Minor Patient Management []  - 0 Isolation Patient Management []  - 0 Hearing / Language / Visual special needs []  - 0 Assessment of Community assistance (transportation, D/C planning, etc.) []  - 0 Additional assistance / Altered mentation []  - 0 Support Surface(s) Assessment (bed, cushion, seat, etc.) INTERVENTIONS - Miscellaneous []  - 0 External ear exam []  - 0 Patient Transfer (multiple staff / Civil Service fast streamer / Similar devices) []  - 0 Simple Staple / Suture removal (25 or  less) []  - 0 Complex Staple / Suture removal (26 or more) []  - 0 Hypo/Hyperglycemic Management (do not check if billed separately) []  - 0 Ankle / Brachial Index (ABI) - do not check if billed separately Has the patient been seen at the hospital within the last three  years: Yes Total Score: 95 Level Of Care: New/Established - Level 3 Electronic Signature(s) Signed: 02/20/2021 5:39:20 PM By: Levan Hurst RN, BSN Entered By: Levan Hurst on 02/20/2021 10:24:11 -------------------------------------------------------------------------------- Lower Extremity Assessment Details Patient Name: Date of Service: [name], [name] 02/20/2021 7:30 A M Medical Record Number: [id number] Patient Account Number: [id number] Date of Birth/Sex: Treating RN: 1935-06-01 (86 y.o. Female) Rhae Hammock Primary Care Evo Aderman: Su Monks Other Clinician: Referring Birtie Fellman: Treating Anasophia Pecor/Extender: Kalman Shan MYA TT, JENNIFER Weeks in Treatment: 0 Electronic Signature(s) Signed: 02/22/2021 5:14:45 PM By: Rhae Hammock RN Entered By: Rhae Hammock on 02/20/2021 08:12:13 -------------------------------------------------------------------------------- Multi Wound Chart Details Patient Name: Date of Service: [name], [name] 02/20/2021 7:30 A M Medical Record Number: [id number] Patient Account Number: [id number] Date of Birth/Sex: Treating RN: 1935/05/21 (86 y.o. Female) Levan Hurst Primary Care Dajanay Northrup: Su Monks Other Clinician: Referring Melani Brisbane: Treating Lorane Cousar/Extender: Kalman Shan MYA TT, JENNIFER Weeks in Treatment: 0 Vital Signs Height(in): 63 Pulse(bpm): 74 Weight(lbs): 80 Blood Pressure(mmHg): 147/74 Body Mass Index(BMI): 14 Temperature(F): 97.4 Respiratory Rate(breaths/min): 17 Photos: [1:No Photos Left Trochanter] [N/A:N/A N/A] Wound Location: [1:Gradually Appeared] [N/A:N/A] Wounding Event: [1:Pressure Ulcer] [N/A:N/A] Primary  Etiology: [1:Hypertension, Myocardial Infarction, N/A] Comorbid History: [1:Crohns, Dementia 02/05/2021] [N/A:N/A] Date Acquired: [1:0] [N/A:N/A] Weeks of Treatment: [1:Open] [N/A:N/A] Wound Status: [1:5x3x2] [N/A:N/A] Measurements L x W x D (cm) [1:11.781] [N/A:N/A] A (cm) : rea [1:23.562] [N/A:N/A] Volume (cm) : [1:12] Position 1 (o'clock): [1:4.5] Maximum Distance 1 (cm): [1:Yes] [N/A:N/A] Tunneling: [1:Category/Stage III] [N/A:N/A] Classification: [1:Large] [N/A:N/A] Exudate A mount: [1:Serosanguineous] [N/A:N/A] Exudate Type: [1:red, brown] [N/A:N/A] Exudate Color: [1:Distinct, outline attached] [N/A:N/A] Wound Margin: [1:Medium (34-66%)] [N/A:N/A] Granulation A mount: [1:Red, Pink] [N/A:N/A] Granulation Quality: [1:Medium (34-66%)] [N/A:N/A] Necrotic A mount: [1:Fat Layer (Subcutaneous Tissue): Yes N/A] Exposed Structures: [1:Fascia: No Tendon: No Muscle: No Joint: No Bone: No Small (1-33%)] [N/A:N/A] Epithelialization: [1:Chemical/Enzymatic/Mechanical] [N/A:N/A] Debridement: Pre-procedure Verification/Time Out 08:50 [N/A:N/A] Taken: [1:N/A] [N/A:N/A] Instrument: [1:None] [N/A:N/A] Bleeding: [1:0] [N/A:N/A] Procedural Pain: [1:0] [N/A:N/A] Post Procedural Pain: [1:Procedure was tolerated well] [N/A:N/A] Debridement Treatment Response: [1:5x3x2] [N/A:N/A] Post Debridement Measurements L x W x D (cm) [1:23.562] [N/A:N/A] Post Debridement Volume: (cm) [1:Category/Stage III] [N/A:N/A] Post Debridement Stage: [1:Debridement] [N/A:N/A] Treatment Notes Electronic Signature(s) Signed: 02/20/2021 10:57:46 AM By: Kalman Shan DO Signed: 02/20/2021 5:39:20 PM By: Levan Hurst RN, BSN Entered By: Kalman Shan on 02/20/2021 08:56:48 -------------------------------------------------------------------------------- Multi-Disciplinary Care Plan Details Patient Name: Date of Service: [name], [name] 02/20/2021 7:30 A M Medical Record Number: [id number] Patient Account  Number: [id number] Date of Birth/Sex: Treating RN: 12/21/1934 (86 y.o. Female) Levan Hurst Primary Care Zaylin Runco: Su Monks Other Clinician: Referring Torina Ey: Treating Belinda Schlichting/Extender: Kalman Shan MYA TT, JENNIFER Weeks in Treatment: 0 Multidisciplinary Care Plan reviewed with physician Active Inactive Abuse / Safety / Falls / Self Care Management Nursing Diagnoses: Potential for falls Potential for injury related to falls Goals: Patient/caregiver will verbalize/demonstrate measures taken to improve the patient's personal safety Date Initiated: 02/20/2021 Target Resolution Date: 03/24/2021 Goal Status: Active Patient/caregiver will verbalize/demonstrate measures taken to prevent injury and/or falls Date Initiated: 02/20/2021 Target Resolution Date: 03/24/2021 Goal Status: Active Interventions: Assess Activities of Daily Living upon admission and as needed Assess fall risk on admission and as needed Assess: immobility, friction, shearing, incontinence upon admission and as needed Assess impairment of mobility on admission and as needed per policy Assess personal safety and home safety (as indicated) on admission and as needed Assess self care needs on admission and as needed Provide education  on fall prevention Provide education on personal and home safety Notes: Nutrition Nursing Diagnoses: Potential for alteratiion in Nutrition/Potential for imbalanced nutrition Goals: Patient/caregiver agrees to and verbalizes understanding of need to use nutritional supplements and/or vitamins as prescribed Date Initiated: 02/20/2021 Target Resolution Date: 03/24/2021 Goal Status: Active Interventions: Assess patient nutrition upon admission and as needed per policy Provide education on nutrition Notes: Pressure Nursing Diagnoses: Knowledge deficit related to causes and risk factors for pressure ulcer development Knowledge deficit related to management of pressures  ulcers Potential for impaired tissue integrity related to pressure, friction, moisture, and shear Goals: Patient/caregiver will verbalize risk factors for pressure ulcer development Date Initiated: 02/20/2021 Target Resolution Date: 03/24/2021 Goal Status: Active Patient/caregiver will verbalize understanding of pressure ulcer management Date Initiated: 02/20/2021 Target Resolution Date: 03/24/2021 Goal Status: Active Interventions: Assess: immobility, friction, shearing, incontinence upon admission and as needed Assess offloading mechanisms upon admission and as needed Assess potential for pressure ulcer upon admission and as needed Provide education on pressure ulcers Notes: Wound/Skin Impairment Nursing Diagnoses: Impaired tissue integrity Knowledge deficit related to ulceration/compromised skin integrity Goals: Patient/caregiver will verbalize understanding of skin care regimen Date Initiated: 02/20/2021 Target Resolution Date: 03/24/2021 Goal Status: Active Ulcer/skin breakdown will have a volume reduction of 30% by week 4 Date Initiated: 02/20/2021 Target Resolution Date: 03/24/2021 Goal Status: Active Interventions: Assess patient/caregiver ability to obtain necessary supplies Assess patient/caregiver ability to perform ulcer/skin care regimen upon admission and as needed Assess ulceration(s) every visit Provide education on ulcer and skin care Notes: Electronic Signature(s) Signed: 02/20/2021 5:39:20 PM By: Levan Hurst RN, BSN Entered By: Levan Hurst on 02/20/2021 10:23:12 -------------------------------------------------------------------------------- Pain Assessment Details Patient Name: Date of Service: [name], [name] 02/20/2021 7:30 A M Medical Record Number: [id number] Patient Account Number: [id number] Date of Birth/Sex: Treating RN: [birth year] (86 y.o. Female) Rhae Hammock Primary Care Monserath Neff: Su Monks Other Clinician: Referring  Shaana Acocella: Treating Daison Braxton/Extender: Kalman Shan MYA TT, JENNIFER Weeks in Treatment: 0 Active Problems Location of Pain Severity and Description of Pain Patient Has Paino No Site Locations Pain Management and Medication Current Pain Management: Electronic Signature(s) Signed: 02/22/2021 5:14:45 PM By: Rhae Hammock RN Entered By: Rhae Hammock on 02/20/2021 08:12:20 -------------------------------------------------------------------------------- Patient/Caregiver Education Details Patient Name: Date of Service: MA Shelbie Proctor DIE [place] Level 5/9/2022andnbsp7:30 [place] Record Number: [id number] Patient Account Number: [id number] Date of Birth/Gender: Treating RN: 1935/02/12 (86 y.o. Female) Levan Hurst Primary Care Physician: Su Monks Other Clinician: Referring Physician: Treating Physician/Extender: Kalman Shan MYA TT, JENNIFER Weeks in Treatment: 0 Education Assessment Education Provided To: Patient Education Topics Provided Nutrition: Methods: Explain/Verbal Responses: State content correctly Pressure: Methods: Explain/Verbal Responses: State content correctly Safety: Methods: Explain/Verbal Responses: State content correctly Wound/Skin Impairment: Methods: Explain/Verbal Responses: State content correctly Electronic Signature(s) Signed: 02/20/2021 5:39:20 PM By: Levan Hurst RN, BSN Entered By: Levan Hurst on 02/20/2021 10:23:27 -------------------------------------------------------------------------------- Wound Assessment Details Patient Name: Date of Service: [name], [name] 02/20/2021 7:30 A M Medical Record Number: [id number] Patient Account Number: [id number] Date of Birth/Sex: Treating RN: 05/18/1935 (86 y.o. Female) Rhae Hammock Primary Care Bernardino Dowell: Su Monks Other Clinician: Referring Ruthanna Macchia: Treating Shakisha Abend/Extender: Kalman Shan MYA TT, JENNIFER Weeks in Treatment: 0 Wound Status Wound Number:  1 Primary Etiology: Pressure Ulcer Wound Location: Left Trochanter Wound Status: Open Wounding Event: Gradually Appeared Comorbid History: Hypertension, Myocardial Infarction, Crohns, Dementia Date Acquired: 02/05/2021 Weeks Of Treatment: 0 Clustered Wound: No Photos Wound Measurements Length: (cm) 5 Width: (cm) 3 Depth: (cm) 2  Area: (cm) 11.781 Volume: (cm) 23.562 % Reduction in Area: 0% % Reduction in Volume: 0% Epithelialization: Small (1-33%) Tunneling: Yes Position (o'clock): 12 Maximum Distance: (cm) 4.5 Undermining: No Wound Description Classification: Category/Stage III Wound Margin: Distinct, outline attached Exudate Amount: Large Exudate Type: Serosanguineous Exudate Color: red, brown Foul Odor After Cleansing: No Slough/Fibrino Yes Wound Bed Granulation Amount: Medium (34-66%) Exposed Structure Granulation Quality: Red, Pink Fascia Exposed: No Necrotic Amount: Medium (34-66%) Fat Layer (Subcutaneous Tissue) Exposed: Yes Necrotic Quality: Adherent Slough Tendon Exposed: No Muscle Exposed: No Joint Exposed: No Bone Exposed: No Electronic Signature(s) Signed: 02/22/2021 9:46:23 AM By: Sandre Kitty Signed: 02/22/2021 5:14:45 PM By: Rhae Hammock RN Entered By: Sandre Kitty on 02/21/2021 16:06:42 -------------------------------------------------------------------------------- Vitals Details Patient Name: Date of Service: [name], [name] 02/20/2021 7:30 A M Medical Record Number: [id number] Patient Account Number: [id number] Date of Birth/Sex: Treating RN: 1935-09-17 (86 y.o. Female) Rhae Hammock Primary Care Xylah Early: Su Monks Other Clinician: Referring Vencent Hauschild: Treating Loucinda Croy/Extender: Kalman Shan MYA TT, JENNIFER Weeks in Treatment: 0 Vital Signs Time Taken: 08:02 Temperature (F): 97.4 Height (in): 63 Pulse (bpm): 74 Source: Stated Respiratory Rate (breaths/min): 17 Weight (lbs): 80 Blood Pressure (mmHg):  147/74 Source: Stated Reference Range: 80 - 120 mg / dl Body Mass Index (BMI): 14.2 Electronic Signature(s) Signed: 02/22/2021 5:14:45 PM By: Rhae Hammock RN Entered By: Rhae Hammock on 02/20/2021 08:04:04

## 2021-02-22 NOTE — Progress Notes (Signed)
CATHIE, BONNELL ([id number]) Visit Report for 02/20/2021 Abuse/Suicide Risk Screen Details Patient Name: Date of Service: [name], [place] [name] 02/20/2021 7:30 A M Medical Record Number: [id number] Patient Account Number: [id number] Date of Birth/Sex: Treating RN: October 31, 1934 (86 y.o. Female) Rhae Hammock Primary Care Braeson Rupe: Su Monks Other Clinician: Referring Kimyata Milich: Treating Tyffani Foglesong/Extender: Kalman Shan MYA TT, JENNIFER Weeks in Treatment: 0 Abuse/Suicide Risk Screen Items Answer ABUSE RISK SCREEN: Has anyone close to you tried to hurt or harm you recentlyo No Do you feel uncomfortable with anyone in your familyo No Has anyone forced you do things that you didnt want to doo No Electronic Signature(s) Signed: 02/22/2021 5:14:45 PM By: Rhae Hammock RN Entered By: Rhae Hammock on 02/20/2021 08:10:33 -------------------------------------------------------------------------------- Activities of Daily Living Details Patient Name: Date of Service: [name], [place] [name] 02/20/2021 7:30 A M Medical Record Number: [id number] Patient Account Number: [id number] Date of Birth/Sex: Treating RN: 1935/01/19 (86 y.o. Female) Rhae Hammock Primary Care Corin Tilly: Su Monks Other Clinician: Referring Murad Staples: Treating Janilah Hojnacki/Extender: Kalman Shan MYA TT, JENNIFER Weeks in Treatment: 0 Activities of Daily Living Items Answer Activities of Daily Living (Please select one for each item) Drive Automobile Not Able T Medications ake Need Assistance Use T elephone Need Assistance Care for Appearance Need Assistance Use T oilet Need Assistance Bath / Shower Need Assistance Dress Self Need Assistance Feed Self Need Assistance Walk Need Assistance Get In / Out Bed Need Assistance Housework Need Assistance Prepare Meals Need Assistance Handle Money Need Assistance Shop for Self Need Assistance Electronic Signature(s) Signed: 02/22/2021 5:14:45 PM By:  Rhae Hammock RN Entered By: Rhae Hammock on 02/20/2021 08:11:10 -------------------------------------------------------------------------------- Education Screening Details Patient Name: Date of Service: [name] 02/20/2021 7:30 A M Medical Record Number: [id number] Patient Account Number: [id number] Date of Birth/Sex: Treating RN: 08-16-1935 (86 y.o. Female) Rhae Hammock Primary Care Anielle Headrick: Su Monks Other Clinician: Referring Annalycia Done: Treating Aubryana Vittorio/Extender: Kalman Shan MYA TT, JENNIFER Weeks in Treatment: 0 Primary Learner Assessed: Patient Learning Preferences/Education Level/Primary Language Learning Preference: Explanation, Demonstration, Communication Board, Printed Material Highest Education Level: College or Above Preferred Language: English Cognitive Barrier Language Barrier: No Translator Needed: No Memory Deficit: No Emotional Barrier: No Cultural/Religious Beliefs Affecting Medical Care: No Physical Barrier Impaired Vision: No Impaired Hearing: No Decreased Hand dexterity: No Knowledge/Comprehension Knowledge Level: High Comprehension Level: High Ability to understand written instructions: High Ability to understand verbal instructions: High Motivation Anxiety Level: Calm Cooperation: Cooperative Education Importance: Denies Need Interest in Health Problems: Asks Questions Perception: Coherent Willingness to Engage in Self-Management High Activities: Readiness to Engage in Self-Management High Activities: Electronic Signature(s) Signed: 02/22/2021 5:14:45 PM By: Rhae Hammock RN Entered By: Rhae Hammock on 02/20/2021 08:11:37 -------------------------------------------------------------------------------- Fall Risk Assessment Details Patient Name: Date of Service: [name] 02/20/2021 7:30 A M Medical Record Number: [id number] Patient Account Number: [id number] Date of Birth/Sex: Treating  RN: 1935-02-06 (86 y.o. Female) Rhae Hammock Primary Care Brailyn Killion: Su Monks Other Clinician: Referring Melissaann Dizdarevic: Treating Tirzah Fross/Extender: Kalman Shan MYA TT, JENNIFER Weeks in Treatment: 0 Fall Risk Assessment Items Have you had 2 or more falls in the last 12 monthso 0 No Have you had any fall that resulted in injury in the last 12 monthso 0 No FALLS RISK SCREEN History of falling - immediate or within 3 months 0 No Secondary diagnosis (Do you have 2 or more medical diagnoseso) 0 No Ambulatory aid None/bed rest/wheelchair/nurse 0 No Crutches/cane/walker 0 No Furniture 0 No Intravenous  therapy Access/Saline/Heparin Lock 0 No Gait/Transferring Normal/ bed rest/ wheelchair 0 No Weak (short steps with or without shuffle, stooped but able to lift head while walking, may seek 0 No support from furniture) Impaired (short steps with shuffle, may have difficulty arising from chair, head down, impaired 0 No balance) Mental Status Oriented to own ability 0 No Electronic Signature(s) Signed: 02/22/2021 5:14:45 PM By: Rhae Hammock RN Entered By: Rhae Hammock on 02/20/2021 08:11:49 -------------------------------------------------------------------------------- Foot Assessment Details Patient Name: Date of Service: [name] 02/20/2021 7:30 A M Medical Record Number: [id number] Patient Account Number: [id number] Date of Birth/Sex: Treating RN: 02-27-1935 (86 y.o. Female) Rhae Hammock Primary Care Gweneth Fredlund: Su Monks Other Clinician: Referring Larna Capelle: Treating Jaydeen Odor/Extender: Kalman Shan MYA TT, JENNIFER Weeks in Treatment: 0 Foot Assessment Items Site Locations + = Sensation present, - = Sensation absent, C = Callus, U = Ulcer R = Redness, W = Warmth, M = Maceration, PU = Pre-ulcerative lesion F = Fissure, S = Swelling, D = Dryness Assessment Right: Left: Other Deformity: No No Prior Foot Ulcer: No No Prior Amputation:  No No Charcot Joint: No No Ambulatory Status: Gait: Notes pt. not diabetic and no LE wounds Electronic Signature(s) Signed: 02/22/2021 5:14:45 PM By: Rhae Hammock RN Entered By: Rhae Hammock on 02/20/2021 08:12:07 -------------------------------------------------------------------------------- Nutrition Risk Screening Details Patient Name: Date of Service: [name] 02/20/2021 7:30 [place] Record Number: [id number] Patient Account Number: [id number] Date of Birth/Sex: Treating RN: 1935/07/01 (86 y.o. Female) Rhae Hammock Primary Care Jeanni Allshouse: Su Monks Other Clinician: Referring Zakery Normington: Treating Sheana Bir/Extender: Kalman Shan MYA TT, JENNIFER Weeks in Treatment: 0 Height (in): 63 Weight (lbs): 80 Body Mass Index (BMI): 14.2 Nutrition Risk Screening Items Score Screening NUTRITION RISK SCREEN: I have an illness or condition that made me change the kind and/or amount of food I eat 0 No I eat fewer than two meals per day 0 No I eat few fruits and vegetables, or milk products 0 No I have three or more drinks of beer, liquor or wine almost every day 0 No I have tooth or mouth problems that make it hard for me to eat 0 No I don't always have enough money to buy the food I need 0 No I eat alone most of the time 0 No I take three or more different prescribed or over-the-counter drugs a day 0 No Without wanting to, I have lost or gained 10 pounds in the last six months 0 No I am not always physically able to shop, cook and/or feed myself 0 No Nutrition Protocols Good Risk Protocol 0 No interventions needed Moderate Risk Protocol High Risk Proctocol Risk Level: Good Risk Score: 0 Electronic Signature(s) Signed: 02/22/2021 5:14:45 PM By: Rhae Hammock RN Entered By: Rhae Hammock on 02/20/2021 08:11:55

## 2021-02-22 NOTE — Progress Notes (Signed)
[name], ZIOLKOWSKI ([id number]) Visit Report for 02/20/2021 Chief Complaint Document Details Patient Name: Date of Service: [name], [place] [name]. 02/20/2021 7:30 A M Medical Record Number: [id number] Patient Account Number: [id number] Date of Birth/Sex: Treating RN: 05-Jun-1935 (86 y.o. Female) Levan Hurst Primary Care Provider: Su Monks Other Clinician: Referring Provider: Treating Provider/Extender: Kalman Shan MYA TT, JENNIFER Weeks in Treatment: 0 Information Obtained from: Patient Chief Complaint Left hip wound Electronic Signature(s) Signed: 02/20/2021 10:57:46 AM By: Kalman Shan DO Entered By: Kalman Shan on 02/20/2021 08:59:12 -------------------------------------------------------------------------------- Debridement Details Patient Name: Date of Service: [name], [name]. 02/20/2021 7:30 A M Medical Record Number: [id number] Patient Account Number: [id number] Date of Birth/Sex: Treating RN: 1935/09/02 (85 y.o. Female) Levan Hurst Primary Care Provider: Su Monks Other Clinician: Referring Provider: Treating Provider/Extender: Kalman Shan MYA TT, JENNIFER Weeks in Treatment: 0 Debridement Performed for Assessment: Wound #1 Left Trochanter Performed By: Clinician Levan Hurst, RN Debridement Type: Chemical/Enzymatic/Mechanical Agent Used: Santyl Level of Consciousness (Pre-procedure): Awake and Alert Pre-procedure Verification/Time Out Yes - 08:50 Taken: Start Time: 08:50 Bleeding: None Procedural Pain: 0 Post Procedural Pain: 0 Response to Treatment: Procedure was tolerated well Level of Consciousness (Post- Awake and Alert procedure): Post Debridement Measurements of Total Wound Length: (cm) 5 Stage: Category/Stage III Width: (cm) 3 Depth: (cm) 2 Volume: (cm) 23.562 Character of Wound/Ulcer Post Debridement: Requires Further Debridement Post Procedure Diagnosis Same as Pre-procedure Electronic Signature(s) Signed: 02/20/2021  10:57:46 AM By: Kalman Shan DO Signed: 02/20/2021 5:39:20 PM By: Levan Hurst RN, BSN Signed: 02/20/2021 5:39:20 PM By: Levan Hurst RN, BSN Entered By: Levan Hurst on 02/20/2021 08:48:54 -------------------------------------------------------------------------------- HPI Details Patient Name: Date of Service: [name], [name]. 02/20/2021 7:30 A M Medical Record Number: [id number] Patient Account Number: [id number] Date of Birth/Sex: Treating RN: Feb 21, 1935 (86 y.o. Female) Levan Hurst Primary Care Provider: Su Monks Other Clinician: Referring Provider: Treating Provider/Extender: Kalman Shan MYA TT, JENNIFER Weeks in Treatment: 0 History of Present Illness HPI Description: Admission 5/9 Ms. Foglio is an 86 year old female with a past medical history of hypertension, hyperlipidemia, CAD s/p MI, and Alzheimer's Disease that presents to the clinic today for A left hip wound. The son is present and helps provide the history. This was noticed about 2 weeks ago and there was a scab to the area. When this was removed there was depth to the wound. It has gotten slightly larger over the past 3 days. Patient denies any fever/chills, nausea/vomiting and states she has been her usual state of health. Per son she has ample at home care . She has an aide that comes in 5 days a week. She is able to walk with support for short distances. This is limited due to her advanced dementia. She usually stays in a chair for most of the day and then transfers to a bed at night. Electronic Signature(s) Signed: 02/20/2021 10:57:46 AM By: Kalman Shan DO Entered By: Kalman Shan on 02/20/2021 09:02:41 -------------------------------------------------------------------------------- Physical Exam Details Patient Name: Date of Service: Rhonda [name], [name]. 02/20/2021 7:30 A M Medical Record Number: [id number] Patient Account Number: [id number] Date of Birth/Sex: Treating RN: 21-Aug-1935 (86  y.o. Female) Levan Hurst Primary Care Provider: Su Monks Other Clinician: Referring Provider: Treating Provider/Extender: Kalman Shan MYA TT, JENNIFER Weeks in Treatment: 0 Constitutional respirations regular, non-labored and within target range for patient.[name] Psychiatric pleasant and cooperative. Notes Left hip: Open wound with necrotic tissue throughout. There are some granulation tissue present.  The area is slightly warm to the touch. No purulent drainage however there is odor with some yellow/green-tinged drainage. There is undermining to the medial half. Electronic Signature(s) Signed: 02/20/2021 10:57:46 AM By: Kalman Shan DO Entered By: Kalman Shan on 02/20/2021 09:03:10 -------------------------------------------------------------------------------- Physician Orders Details Patient Name: Date of Service: [name], [name]. 02/20/2021 7:30 A M Medical Record Number: [id number] Patient Account Number: [id number] Date of Birth/Sex: Treating RN: 12-21-1934 (86 y.o. Female) Levan Hurst Primary Care Provider: Su Monks Other Clinician: Referring Provider: Treating Provider/Extender: Kalman Shan MYA TT, JENNIFER Weeks in Treatment: 0 Verbal / Phone Orders: No Diagnosis Coding ICD-10 Coding Code Description [id number] Pressure ulcer of left hip, stage 3 G30.9 Alzheimer's disease, unspecified I10 Essential (primary) hypertension N18.9 Chronic kidney disease, unspecified Follow-up Appointments ppointment in 1 week. - with Dr. Heber  Return A Bathing/ Shower/ Hygiene May shower and wash wound with soap and water. Off-Loading Gel mattress overlay (Group 1) Roho cushion for wheelchair Turn and reposition every 2 hours Home Health dmit to [facility] for wound care. May utilize formulary equivalent dressing for wound treatment orders unless otherwise specified. - A for wound care 2-3 times a week, pt has cg in home Wound Treatment Wound  #1 - Trochanter Wound Laterality: Left Cleanser: Soap and Water 1 x Per Day/7 Days Discharge Instructions: May shower and wash wound with dial antibacterial soap and water prior to dressing change. Cleanser: Wound Cleanser (Home Health) 1 x Per Day/7 Days Discharge Instructions: Cleanse the wound with wound cleanser or normal saline prior to applying a clean dressing using gauze sponges, not tissue or cotton balls. Peri-Wound Care: Skin Prep (Home Health) 1 x Per Day/7 Days Discharge Instructions: Use skin prep as directed Prim Dressing: Santyl Ointment ([place]) 1 x Per Day/7 Days ary Discharge Instructions: Apply nickel thick amount to wound bed as instructed Prim Dressing: Saline moistened gauze ([place]) 1 x Per Day/7 Days ary Discharge Instructions: lightly pack with saline moistened gauze, over Santyl Secondary Dressing: Bordered Gauze, 4x4 in ([place]) 1 x Per Day/7 Days Discharge Instructions: Apply over primary dressing as directed. May also use ABD pad and tape. Laboratory naerobe culture (MICRO) - Left hip Bacteria identified in Unspecified specimen by A LOINC Code: 836-6 Convenience Name: Anerobic culture Patient Medications llergies: Sulfa (Sulfonamide Antibiotics) A Notifications Medication Indication Start End 02/20/2021 Santyl DOSE 1 - topical 250 unit/gram ointment - 1 application daily Electronic Signature(s) Signed: 02/20/2021 9:04:53 AM By: Kalman Shan DO Signed: 02/20/2021 9:04:53 AM By: Kalman Shan DO Entered By: Kalman Shan on 02/20/2021 09:04:53 -------------------------------------------------------------------------------- Problem List Details Patient Name: Date of Service: [name], [name]. 02/20/2021 7:30 A M Medical Record Number: [id number] Patient Account Number: [id number] Date of Birth/Sex: Treating RN: December 06, 1934 (86 y.o. Female) Levan Hurst Primary Care Provider: Su Monks Other Clinician: Referring  Provider: Treating Provider/Extender: Kalman Shan MYA TT, JENNIFER Weeks in Treatment: 0 Active Problems ICD-10 Encounter Code Description Active Date MDM Diagnosis L89.223 Pressure ulcer of left hip, stage 3 02/20/2021 No Yes G30.9 Alzheimer's disease, unspecified 02/20/2021 No Yes I10 Essential (primary) hypertension 02/20/2021 No Yes N18.9 Chronic kidney disease, unspecified 02/20/2021 No Yes Inactive Problems Resolved Problems Electronic Signature(s) Signed: 02/20/2021 10:57:46 AM By: Kalman Shan DO Entered By: Kalman Shan on 02/20/2021 08:56:24 -------------------------------------------------------------------------------- Progress Note Details Patient Name: Date of Service: [name], [name]. 02/20/2021 7:30 A M Medical Record Number: [id number] Patient Account Number: [id number] Date of Birth/Sex: Treating RN: [birth year] (86 y.o. Female)  Levan Hurst Primary Care Provider: Su Monks Other Clinician: Referring Provider: Treating Provider/Extender: Kalman Shan MYA TT, JENNIFER Weeks in Treatment: 0 Subjective Chief Complaint Information obtained from Patient Left hip wound History of Present Illness (HPI) Admission 5/9 Ms. Deyarmin is an 86 year old female with a past medical history of hypertension, hyperlipidemia, CAD s/p MI, and Alzheimer's Disease that presents to the clinic today for A left hip wound. The son is present and helps provide the history. This was noticed about 2 weeks ago and there was a scab to the area. When this was removed there was depth to the wound. It has gotten slightly larger over the past 3 days. Patient denies any fever/chills, nausea/vomiting and states she has been her usual state of health. Per son she has ample at home care . She has an aide that comes in 5 days a week. She is able to walk with support for short distances. This is limited due to her advanced dementia. She usually stays in a chair for most of the day and  then transfers to a bed at night. Patient History Unable to Obtain Patient History due to Dementia. Information obtained from Patient. Allergies Sulfa (Sulfonamide Antibiotics) Family History Unknown History. Social History Never smoker, Marital Status - Married, Alcohol Use - Never, Drug Use - No History, Caffeine Use - Never. Medical History Eyes Denies history of Cataracts, Glaucoma, Optic Neuritis Ear/Nose/Mouth/Throat Denies history of Chronic sinus problems/congestion, Middle ear problems Hematologic/Lymphatic Denies history of Anemia, Hemophilia, Human Immunodeficiency Virus, Lymphedema, Sickle Cell Disease Respiratory Denies history of Aspiration, Asthma, Chronic Obstructive Pulmonary Disease (COPD), Pneumothorax, Sleep Apnea, Tuberculosis Cardiovascular Patient has history of Hypertension, Myocardial Infarction - 2019 Denies history of Angina, Arrhythmia, Congestive Heart Failure, Coronary Artery Disease, Deep Vein Thrombosis, Hypotension, Peripheral Arterial Disease, Peripheral Venous Disease, Phlebitis, Vasculitis Gastrointestinal Patient has history of Crohnoos Denies history of Cirrhosis , Colitis, Hepatitis A, Hepatitis B, Hepatitis C Endocrine Denies history of Type I Diabetes, Type II Diabetes Genitourinary Denies history of End Stage Renal Disease Immunological Denies history of Lupus Erythematosus, Raynaudoos, Scleroderma Integumentary (Skin) Denies history of History of Burn Musculoskeletal Denies history of Gout, Rheumatoid Arthritis, Osteoarthritis, Osteomyelitis Neurologic Patient has history of Dementia Denies history of Neuropathy, Quadriplegia, Paraplegia, Seizure Disorder Oncologic Denies history of Received Chemotherapy, Received Radiation Psychiatric Denies history of Anorexia/bulimia, Confinement Anxiety Hospitalization/Surgery History - left heart cath and coronary angiography. - cardiac cath. - EGD. - colectomy. - dilation and curettage of  uterus. - lap right hemicolectomy. Medical A Surgical History Notes nd Gastrointestinal incontienent Genitourinary incontinent; CKD stage 4 Neurologic hx TIA; alzheimer's Review of Systems (ROS) Constitutional Symptoms (General Health) Denies complaints or symptoms of Fatigue, Fever, Chills, Marked Weight Change. Eyes Denies complaints or symptoms of Dry Eyes, Vision Changes, Glasses / Contacts. Ear/Nose/Mouth/Throat Denies complaints or symptoms of Chronic sinus problems or rhinitis. Respiratory Denies complaints or symptoms of Chronic or frequent coughs, Shortness of Breath. Cardiovascular Denies complaints or symptoms of Chest pain. Gastrointestinal Denies complaints or symptoms of Frequent diarrhea, Nausea, Vomiting. Endocrine Denies complaints or symptoms of Heat/cold intolerance. Genitourinary Denies complaints or symptoms of Frequent urination. Musculoskeletal Denies complaints or symptoms of Muscle Pain, Muscle Weakness. Neurologic Denies complaints or symptoms of Numbness/parasthesias. Psychiatric Denies complaints or symptoms of Claustrophobia, Suicidal. Objective Constitutional respirations regular, non-labored and within target range for patient.. Vitals Time Taken: 8:02 AM, Height: 63 in, Source: Stated, Weight: 80 lbs, Source: Stated, BMI: 14.2, Temperature: 97.4 F, Pulse: 74 bpm, Respiratory Rate: 17  breaths/min, Blood Pressure: 147/74 mmHg. Psychiatric pleasant and cooperative. General Notes: Left hip: Open wound with necrotic tissue throughout. There are some granulation tissue present. The area is slightly warm to the touch. No purulent drainage however there is odor with some yellow/green-tinged drainage. There is undermining to the medial half. Integumentary (Hair, Skin) Wound #1 status is Open. Original cause of wound was Gradually Appeared. The date acquired was: 02/05/2021. The wound is located on the Left Trochanter. The wound measures 5cm length x  3cm width x 2cm depth; 11.781cm^2 area and 23.562cm^3 volume. There is Fat Layer (Subcutaneous Tissue) exposed. There is no undermining noted, however, there is tunneling at 12:00 with a maximum distance of 4.5cm. There is a large amount of serosanguineous drainage noted. The wound margin is distinct with the outline attached to the wound base. There is medium (34-66%) red, pink granulation within the wound bed. There is a medium (34-66%) amount of necrotic tissue within the wound bed including Adherent Slough. Assessment Active Problems ICD-10 Pressure ulcer of left hip, stage 3 Alzheimer's disease, unspecified Essential (primary) hypertension Chronic kidney disease, unspecified Patient presents for a worsening wound to her left hip over the past 2 weeks. I do suspect this has been there longer due to the size of the undermining. She cannot tolerate debridement. I would like to use an enzymatic debridement throughout the necrotic tissue. I did obtain a culture as I suspect this is infected. Her vitals are stable today. I will see her back in one week. Procedures Wound #1 Pre-procedure diagnosis of Wound #1 is a Pressure Ulcer located on the Left Trochanter . There was a Chemical/Enzymatic/Mechanical debridement performed by Levan Hurst, RN.[name] Agent used was [employer]. A time out was conducted at 08:50, prior to the start of the procedure. There was no bleeding. The procedure was tolerated well with a pain level of 0 throughout and a pain level of 0 following the procedure. Post Debridement Measurements: 5cm length x 3cm width x 2cm depth; 23.562cm^3 volume. Post debridement Stage noted as Category/Stage III. Character of Wound/Ulcer Post Debridement requires further debridement. Post procedure Diagnosis Wound #1: Same as Pre-Procedure Plan Follow-up Appointments: Return Appointment in 1 week. - with Dr. Heber [place] Bathing/ Shower/ Hygiene: May shower and wash wound with soap and  water. Off-Loading: Gel mattress overlay (Group 1) Roho cushion for wheelchair Turn and reposition every 2 hours Home Health: Admit to [facility] for wound care. May utilize formulary equivalent dressing for wound treatment orders unless otherwise specified. - for wound care 2-3 times a week, pt has cg in home Laboratory ordered were: Anerobic culture - Left hip The following medication(s) was prescribed: Santyl topical 250 unit/gram ointment 1 1 application daily starting 02/20/2021 WOUND #1: - Trochanter Wound Laterality: Left Cleanser: Soap and Water 1 x Per Day/7 Days Discharge Instructions: May shower and wash wound with dial antibacterial soap and water prior to dressing change. Cleanser: Wound Cleanser (Home Health) 1 x Per Day/7 Days Discharge Instructions: Cleanse the wound with wound cleanser or normal saline prior to applying a clean dressing using gauze sponges, not tissue or cotton balls. Peri-Wound Care: Skin Prep (Home Health) 1 x Per Day/7 Days Discharge Instructions: Use skin prep as directed Prim Dressing: Santyl Ointment ([name]) 1 x Per Day/7 Days ary Discharge Instructions: Apply nickel thick amount to wound bed as instructed Prim Dressing: Saline moistened gauze ([name]) 1 x Per Day/7 Days ary Discharge Instructions: lightly pack with saline moistened gauze, over Santyl Secondary Dressing:  Bordered Gauze, 4x4 in (Home Health) 1 x Per Day/7 Days Discharge Instructions: Apply over primary dressing as directed. May also use ABD pad and tape. 1. Santyl daily. 2. Culture 3. Follow-up in one week Electronic Signature(s) Signed: 02/20/2021 10:57:46 AM By: Kalman Shan DO Entered By: Kalman Shan on 02/20/2021 09:16:44 -------------------------------------------------------------------------------- HxROS Details Patient Name: Date of Service: [name], [name]. 02/20/2021 7:30 A M Medical Record Number: [id number] Patient Account Number:  [id number] Date of Birth/Sex: Treating RN: 08-23-1935 (86 y.o. Female) Rhae Hammock Primary Care Provider: Su Monks Other Clinician: Referring Provider: Treating Provider/Extender: Kalman Shan MYA TT, JENNIFER Weeks in Treatment: 0 Unable to Obtain Patient History due to Dementia Information Obtained From Patient Constitutional Symptoms (General Health) Complaints and Symptoms: Negative for: Fatigue; Fever; Chills; Marked Weight Change Eyes Complaints and Symptoms: Negative for: Dry Eyes; Vision Changes; Glasses / Contacts Medical History: Negative for: Cataracts; Glaucoma; Optic Neuritis Ear/Nose/Mouth/Throat Complaints and Symptoms: Negative for: Chronic sinus problems or rhinitis Medical History: Negative for: Chronic sinus problems/congestion; Middle ear problems Respiratory Complaints and Symptoms: Negative for: Chronic or frequent coughs; Shortness of Breath Medical History: Negative for: Aspiration; Asthma; Chronic Obstructive Pulmonary Disease (COPD); Pneumothorax; Sleep Apnea; Tuberculosis Cardiovascular Complaints and Symptoms: Negative for: Chest pain Medical History: Positive for: Hypertension; Myocardial Infarction - 2019 Negative for: Angina; Arrhythmia; Congestive Heart Failure; Coronary Artery Disease; Deep Vein Thrombosis; Hypotension; Peripheral Arterial Disease; Peripheral Venous Disease; Phlebitis; Vasculitis Gastrointestinal Complaints and Symptoms: Negative for: Frequent diarrhea; Nausea; Vomiting Medical History: Positive for: Crohns Negative for: Cirrhosis ; Colitis; Hepatitis A; Hepatitis B; Hepatitis C Past Medical History Notes: incontienent Endocrine Complaints and Symptoms: Negative for: Heat/cold intolerance Medical History: Negative for: Type I Diabetes; Type II Diabetes Genitourinary Complaints and Symptoms: Negative for: Frequent urination Medical History: Negative for: End Stage Renal Disease Past Medical  History Notes: incontinent; CKD stage 4 Musculoskeletal Complaints and Symptoms: Negative for: Muscle Pain; Muscle Weakness Medical History: Negative for: Gout; Rheumatoid Arthritis; Osteoarthritis; Osteomyelitis Neurologic Complaints and Symptoms: Negative for: Numbness/parasthesias Medical History: Positive for: Dementia Negative for: Neuropathy; Quadriplegia; Paraplegia; Seizure Disorder Past Medical History Notes: hx TIA; alzheimer's Psychiatric Complaints and Symptoms: Negative for: Claustrophobia; Suicidal Medical History: Negative for: Anorexia/bulimia; Confinement Anxiety Hematologic/Lymphatic Medical History: Negative for: Anemia; Hemophilia; Human Immunodeficiency Virus; Lymphedema; Sickle Cell Disease Immunological Medical History: Negative for: Lupus Erythematosus; Raynauds; Scleroderma Integumentary (Skin) Medical History: Negative for: History of Burn Oncologic Medical History: Negative for: Received Chemotherapy; Received Radiation Immunizations Pneumococcal Vaccine: Received Pneumococcal Vaccination: No Implantable Devices None Hospitalization / Surgery History Type of Hospitalization/Surgery left heart cath and coronary angiography cardiac cath EGD colectomy dilation and curettage of uterus lap right hemicolectomy Family and Social History Unknown History: Yes; Never smoker; Marital Status - Married; Alcohol Use: Never; Drug Use: No History; Caffeine Use: Never; Financial Concerns: No; Food, Clothing or Shelter Needs: No; Support System Lacking: No; Transportation Concerns: No Electronic Signature(s) Signed: 02/20/2021 10:57:46 AM By: Kalman Shan DO Signed: 02/22/2021 5:14:45 PM By: Rhae Hammock RN Entered By: Rhae Hammock on 02/20/2021 08:09:39 -------------------------------------------------------------------------------- SuperBill Details Patient Name: Date of Service: [name], [name]. 02/20/2021 Medical Record Number:  [id number] Patient Account Number: [id number] Date of Birth/Sex: Treating RN: [birth year] (86 y.o. Female) Levan Hurst Primary Care Provider: Su Monks Other Clinician: Referring Provider: Treating Provider/Extender: Kalman Shan MYA TT, JENNIFER Weeks in Treatment: 0 Diagnosis Coding ICD-10 Codes Code Description [phone] Pressure ulcer of left hip, stage 3 G30.9 Alzheimer's disease, unspecified I10 Essential (primary) hypertension N18.9 Chronic  kidney disease, unspecified Facility Procedures CPT4 Code: [id number] Description: [place] VISIT-LEV 3 EST PT Modifier: 25 Quantity: 1 CPT4 Code: [id number] Description: 7602 - DEBRIDE W/O ANES NON SELECT ICD-10 Diagnosis Description L89.223 Pressure ulcer of left hip, stage 3 Modifier: Quantity: 1 Physician Procedures Electronic Signature(s) Signed: 02/20/2021 10:57:46 AM By: Kalman Shan DO Signed: 02/20/2021 5:39:20 PM By: Levan Hurst RN, BSN Entered By: Levan Hurst on 02/20/2021 10:24:24

## 2021-02-24 LAB — AEROBIC CULTURE W GRAM STAIN (SUPERFICIAL SPECIMEN): Gram Stain: NONE SEEN

## 2021-03-02 ENCOUNTER — Encounter (HOSPITAL_BASED_OUTPATIENT_CLINIC_OR_DEPARTMENT_OTHER): Payer: Medicare Other | Admitting: Internal Medicine

## 2021-03-02 ENCOUNTER — Other Ambulatory Visit: Payer: Self-pay

## 2021-03-02 DIAGNOSIS — N189 Chronic kidney disease, unspecified: Secondary | ICD-10-CM | POA: Diagnosis not present

## 2021-03-02 DIAGNOSIS — G309 Alzheimer's disease, unspecified: Secondary | ICD-10-CM | POA: Diagnosis not present

## 2021-03-02 DIAGNOSIS — L89223 Pressure ulcer of left hip, stage 3: Secondary | ICD-10-CM

## 2021-03-02 DIAGNOSIS — I10 Essential (primary) hypertension: Secondary | ICD-10-CM

## 2021-03-02 NOTE — Progress Notes (Signed)
LOXLEY, CIBRIAN ([id number]) Visit Report for 03/02/2021 Chief Complaint Document Details Patient Name: Date of Service: [name], [place] [name] 03/02/2021 3:45 PM Medical Record Number: [id number] Patient Account Number: [id number] Date of Birth/Sex: Treating RN: 1934/12/13 (86 y.o. Debby Bud Primary Care Provider: Su Monks Other Clinician: Referring Provider: Treating Provider/Extender: Kalman Shan MYA TT, JENNIFER Weeks in Treatment: 1 Information Obtained from: Patient Chief Complaint Left hip wound Electronic Signature(s) Signed: 03/02/2021 5:04:36 PM By: Kalman Shan DO Entered By: Kalman Shan on 03/02/2021 16:52:23 -------------------------------------------------------------------------------- Debridement Details Patient Name: Date of Service: [name] 03/02/2021 3:45 PM Medical Record Number: [id number] Patient Account Number: [id number] Date of Birth/Sex: Treating RN: 06-07-1935 (85 y.o. [name], Meta.Reding Primary Care Provider: Su Monks Other Clinician: Referring Provider: Treating Provider/Extender: Kalman Shan MYA TT, JENNIFER Weeks in Treatment: 1 Debridement Performed for Assessment: Wound #1 Left Trochanter Performed By: Physician Kalman Shan, DO Debridement Type: Debridement Level of Consciousness (Pre-procedure): Awake and Alert Pre-procedure Verification/Time Out Yes - 16:30 Taken: Start Time: 16:31 Pain Control: Lidocaine 4% T opical Solution T Area Debrided (L x W): otal 1 (cm) x 1 (cm) = 1 (cm) Tissue and other material debrided: Non-Viable, Slough, Slough Level: Non-Viable Tissue Debridement Description: Selective/Open Wound Instrument: Forceps, Scissors Bleeding: Minimum Hemostasis Achieved: Pressure End Time: 16:35 Procedural Pain: 0 Post Procedural Pain: 0 Response to Treatment: Procedure was tolerated well Level of Consciousness (Post- Awake and Alert procedure): Post Debridement Measurements  of Total Wound Length: (cm) 5 Stage: Category/Stage III Width: (cm) 2.8 Depth: (cm) 1 Volume: (cm) 10.996 Character of Wound/Ulcer Post Debridement: Requires Further Debridement Post Procedure Diagnosis Same as Pre-procedure Electronic Signature(s) Signed: 03/02/2021 5:04:36 PM By: Kalman Shan DO Signed: 03/02/2021 6:00:49 PM By: Deon Pilling Entered By: Deon Pilling on 03/02/2021 16:36:04 -------------------------------------------------------------------------------- HPI Details Patient Name: Date of Service: [name], [name] 03/02/2021 3:45 PM Medical Record Number: [id number] Patient Account Number: [id number] Date of Birth/Sex: Treating RN: [birth year] (86 y.o. Debby Bud Primary Care Provider: Su Monks Other Clinician: Referring Provider: Treating Provider/Extender: Kalman Shan MYA TT, JENNIFER Weeks in Treatment: 1 History of Present Illness HPI Description: Admission 5/9 Ms. Kronick is an 86 year old female with a past medical history of hypertension, hyperlipidemia, CAD s/p MI, and Alzheimer's Disease that presents to the clinic today for A left hip wound. The son is present and helps provide the history. This was noticed about 2 weeks ago and there was a scab to the area. When this was removed there was depth to the wound. It has gotten slightly larger over the past 3 days. Patient denies any fever/chills, nausea/vomiting and states she has been her usual state of health. Per son she has ample at home care . She has an aide that comes in 5 days a week. She is able to walk with support for short distances. This is limited due to her advanced dementia. She usually stays in a chair for most of the day and then transfers to a bed at night. 5/19; patient presents for 1 week follow-up. Son was present and helped provide the history. She has been taking Augmentin without any issues. They have been applying Santyl daily to the wound. No complaints  today. Electronic Signature(s) Signed: 03/02/2021 5:04:36 PM By: Kalman Shan DO Entered By: Kalman Shan on 03/02/2021 16:53:49 -------------------------------------------------------------------------------- Physical Exam Details Patient Name: Date of Service: [name], [name] 03/02/2021 3:45 PM Medical Record Number: [id number] Patient Account  Number: [id number] Date of Birth/Sex: Treating RN: 25-Dec-1934 (86 y.o. Debby Bud Primary Care Provider: Su Monks Other Clinician: Referring Provider: Treating Provider/Extender: Kalman Shan MYA TT, JENNIFER Weeks in Treatment: 1 Constitutional respirations regular, non-labored and within target range for patient.. Notes Left hip: There is an open wound with healthy granulation tissue present. There is undermining and when palpating the area malodorous drainage was present. There is also necrotic debris in the undermining area. Some of this was able to be removed. Electronic Signature(s) Signed: 03/02/2021 5:04:36 PM By: Kalman Shan DO Entered By: Kalman Shan on 03/02/2021 16:55:08 -------------------------------------------------------------------------------- Physician Orders Details Patient Name: Date of Service: [name], [name] 03/02/2021 3:45 PM Medical Record Number: [id number] Patient Account Number: [id number] Date of Birth/Sex: Treating RN: 06/08/1935 (86 y.o. Debby Bud Primary Care Provider: Su Monks Other Clinician: Referring Provider: Treating Provider/Extender: Kalman Shan MYA TT, JENNIFER Weeks in Treatment: 1 Verbal / Phone Orders: No Diagnosis Coding ICD-10 Coding Code Description [phone] Pressure ulcer of left hip, stage 3 G30.9 Alzheimer's disease, unspecified I10 Essential (primary) hypertension N18.9 Chronic kidney disease, unspecified Follow-up Appointments ppointment in 1 week. - with Dr. Heber [place] Return A Bathing/ Shower/ Hygiene May shower and  wash wound with soap and water. Off-Loading Gel mattress overlay (Group 1) Roho cushion for wheelchair Turn and reposition every 2 hours [place] wound care orders this week; continue Home Health for wound care. May utilize formulary equivalent dressing for wound treatment orders unless otherwise specified. [facility] home health for wound care 2-3 times a week, pt has cg in home. Wound Treatment Wound #1 - Trochanter Wound Laterality: Left Cleanser: Soap and Water Other:twice a day/7 Days Discharge Instructions: May shower and wash wound with dial antibacterial soap and water prior to dressing change. Cleanser: Wound Cleanser [facility]) Other:twice a day/7 Days Discharge Instructions: Cleanse the wound with wound cleanser or normal saline prior to applying a clean dressing using gauze sponges, not tissue or cotton balls. Peri-Wound Care: Skin Prep [facility]) Other:twice a day/7 Days Discharge Instructions: Use skin prep as directed Prim Dressing: Dakin's solution wet to dry [facility]) Other:twice a day/7 Days ary Discharge Instructions: lightly pack with Dakin's wet to dry dressing. In clinic to use anascept wet to dry Secondary Dressing: Bordered Gauze, 4x4 in [facility]) Other:twice a day/7 Days Discharge Instructions: Apply over primary dressing as directed. May also use ABD pad and tape. Patient Medications llergies: Sulfa (Sulfonamide Antibiotics) A Notifications Medication Indication Start End 03/02/2021 Dakin's Solution DOSE 1 - miscellaneous 0.125 % solution - 1 application 2 times daily Electronic Signature(s) Signed: 03/02/2021 5:04:36 PM By: Kalman Shan DO Previous Signature: 03/02/2021 4:50:41 PM Version By: Kalman Shan DO Entered By: Kalman Shan on 03/02/2021 16:55:25 -------------------------------------------------------------------------------- Problem List Details Patient Name: Date of Service: [name], [name] 03/02/2021 3:45  PM Medical Record Number: [id number] Patient Account Number: [id number] Date of Birth/Sex: Treating RN: 09/23/1935 (86 y.o. Debby Bud Primary Care Provider: Su Monks Other Clinician: Referring Provider: Treating Provider/Extender: Kalman Shan MYA TT, JENNIFER Weeks in Treatment: 1 Active Problems ICD-10 Encounter Code Description Active Date MDM Diagnosis L89.223 Pressure ulcer of left hip, stage 3 02/20/2021 No Yes G30.9 Alzheimer's disease, unspecified 02/20/2021 No Yes I10 Essential (primary) hypertension 02/20/2021 No Yes N18.9 Chronic kidney disease, unspecified 02/20/2021 No Yes Inactive Problems Resolved Problems Electronic Signature(s) Signed: 03/02/2021 5:04:36 PM By: Kalman Shan DO Entered By: Kalman Shan on 03/02/2021 16:51:52 -------------------------------------------------------------------------------- Progress Note Details  Patient Name: Date of Service: [name], [place] [name] 03/02/2021 3:45 PM Medical Record Number: [id number] Patient Account Number: [id number] Date of Birth/Sex: Treating RN: 1935-08-05 (86 y.o. Debby Bud Primary Care Provider: Su Monks Other Clinician: Referring Provider: Treating Provider/Extender: Kalman Shan MYA TT, JENNIFER Weeks in Treatment: 1 Subjective Chief Complaint Information obtained from Patient Left hip wound History of Present Illness (HPI) Admission 5/9 Ms. Gerstel is an 86 year old female with a past medical history of hypertension, hyperlipidemia, CAD s/p MI, and Alzheimer's Disease that presents to the clinic today for A left hip wound. The son is present and helps provide the history. This was noticed about 2 weeks ago and there was a scab to the area. When this was removed there was depth to the wound. It has gotten slightly larger over the past 3 days. Patient denies any fever/chills, nausea/vomiting and states she has been her usual state of health. Per son she has ample at  home care . She has an aide that comes in 5 days a week. She is able to walk with support for short distances. This is limited due to her advanced dementia. She usually stays in a chair for most of the day and then transfers to a bed at night. 5/19; patient presents for 1 week follow-up. Son was present and helped provide the history. She has been taking Augmentin without any issues. They have been applying Santyl daily to the wound. No complaints today. Patient History Unable to Obtain Patient History due to Dementia. Information obtained from Patient. Family History Unknown History. Social History Never smoker, Marital Status - Married, Alcohol Use - Never, Drug Use - No History, Caffeine Use - Never. Medical History Eyes Denies history of Cataracts, Glaucoma, Optic Neuritis Ear/Nose/Mouth/Throat Denies history of Chronic sinus problems/congestion, Middle ear problems Hematologic/Lymphatic Denies history of Anemia, Hemophilia, Human Immunodeficiency Virus, Lymphedema, Sickle Cell Disease Respiratory Denies history of Aspiration, Asthma, Chronic Obstructive Pulmonary Disease (COPD), Pneumothorax, Sleep Apnea, Tuberculosis Cardiovascular Patient has history of Hypertension, Myocardial Infarction - 2019 Denies history of Angina, Arrhythmia, Congestive Heart Failure, Coronary Artery Disease, Deep Vein Thrombosis, Hypotension, Peripheral Arterial Disease, Peripheral Venous Disease, Phlebitis, Vasculitis Gastrointestinal Patient has history of Crohnoos Denies history of Cirrhosis , Colitis, Hepatitis A, Hepatitis B, Hepatitis C Endocrine Denies history of Type I Diabetes, Type II Diabetes Genitourinary Denies history of End Stage Renal Disease Immunological Denies history of Lupus Erythematosus, Raynaudoos, Scleroderma Integumentary (Skin) Denies history of History of Burn Musculoskeletal Denies history of Gout, Rheumatoid Arthritis, Osteoarthritis,  Osteomyelitis Neurologic Patient has history of Dementia Denies history of Neuropathy, Quadriplegia, Paraplegia, Seizure Disorder Oncologic Denies history of Received Chemotherapy, Received Radiation Psychiatric Denies history of Anorexia/bulimia, Confinement Anxiety Hospitalization/Surgery History - left heart cath and coronary angiography. - cardiac cath. - EGD. - colectomy. - dilation and curettage of uterus. - lap right hemicolectomy. Medical A Surgical History Notes nd Gastrointestinal incontienent Genitourinary incontinent; CKD stage 4 Neurologic hx TIA; alzheimer's Objective Constitutional respirations regular, non-labored and within target range for patient.. Vitals Time Taken: 4:17 PM, Height: 63 in, Weight: 80 lbs, BMI: 14.2, Pulse: 89 bpm, Respiratory Rate: 18 breaths/min, Blood Pressure: 111/76 mmHg. General Notes: Left hip: There is an open wound with healthy granulation tissue present. There is undermining and when palpating the area malodorous drainage was present. There is also necrotic debris in the undermining area. Some of this was able to be removed. Integumentary (Hair, Skin) Wound #1 status is Open. Original cause of wound was  Gradually Appeared. The date acquired was: 02/05/2021. The wound has been in treatment 1 weeks. The wound is located on the Left Trochanter. The wound measures 5cm length x 2.8cm width x 1cm depth; 10.996cm^2 area and 10.996cm^3 volume. There is Fat Layer (Subcutaneous Tissue) exposed. There is no undermining noted, however, there is tunneling at 9:00 with a maximum distance of 4cm. There is a large amount of serosanguineous drainage noted. The wound margin is distinct with the outline attached to the wound base. There is large (67-100%) red, pink granulation within the wound bed. There is a small (1-33%) amount of necrotic tissue within the wound bed including Adherent Slough. Assessment Active Problems ICD-10 Pressure ulcer of left hip,  stage 3 Alzheimer's disease, unspecified Essential (primary) hypertension Chronic kidney disease, unspecified Patient presents for 1 week follow-up. The open wound that is visible has good granulation tissue present. I think the Santyl worked well. However there continues to be yellow and green drainage that is malodorous. There is also necrotic debris in the undermining and I was able to debride some of this today. I think she would actually benefit from a week of Dakin's solution to see if we can remove the rest of the necrotic debris with wet-to-dry dressings and see if this will help with odor and bacteria control. She is currently taking the antibiotics prescribed based on the culture results last week. She is tolerating this well. I recommended she finish the course. Overall she feels well. Procedures Wound #1 Pre-procedure diagnosis of Wound #1 is a Pressure Ulcer located on the Left Trochanter . There was a Selective/Open Wound Non-Viable Tissue Debridement with a total area of 1 sq cm performed by Kalman Shan, DO. With the following instrument(s): Forceps, and Scissors to remove Non-Viable tissue/material. Material removed includes Slough after achieving pain control using Lidocaine 4% Topical Solution. A time out was conducted at 16:30, prior to the start of the procedure. A Minimum amount of bleeding was controlled with Pressure. The procedure was tolerated well with a pain level of 0 throughout and a pain level of 0 following the procedure. Post Debridement Measurements: 5cm length x 2.8cm width x 1cm depth; 10.996cm^3 volume. Post debridement Stage noted as Category/Stage III. Character of Wound/Ulcer Post Debridement requires further debridement. Post procedure Diagnosis Wound #1: Same as Pre-Procedure Plan Follow-up Appointments: Return Appointment in 1 week. - with Dr. Heber [place] Bathing/ Shower/ Hygiene: May shower and wash wound with soap and water. Off-Loading: Gel  mattress overlay (Group 1) Roho cushion for wheelchair Turn and reposition every 2 hours Home Health: New wound care orders this week; continue Home Health for wound care. May utilize formulary equivalent dressing for wound treatment orders unless otherwise specified. [facility] home health for wound care 2-3 times a week, pt has cg in home. The following medication(s) was prescribed: Dakin's Solution miscellaneous 0.125 % solution 1 1 application 2 times daily starting 03/02/2021 WOUND #1: - Trochanter Wound Laterality: Left Cleanser: Soap and Water Other:twice a day/7 Days Discharge Instructions: May shower and wash wound with dial antibacterial soap and water prior to dressing change. Cleanser: Wound Cleanser [facility]) Other:twice a day/7 Days Discharge Instructions: Cleanse the wound with wound cleanser or normal saline prior to applying a clean dressing using gauze sponges, not tissue or cotton balls. Peri-Wound Care: Skin Prep [facility]) Other:twice a day/7 Days Discharge Instructions: Use skin prep as directed Prim Dressing: Dakin's solution wet to dry [facility]) Other:twice a day/7 Days ary Discharge Instructions: lightly pack  with Dakin's wet to dry dressing. In clinic to use anascept wet to dry Secondary Dressing: Bordered Gauze, 4x4 in [facility]) Other:twice a day/7 Days Discharge Instructions: Apply over primary dressing as directed. May also use ABD pad and tape. 1. Stop Santyl 2. Dakin's solution quarter strength twice daily with wet-to-dry dressings 3. Follow-up in 1 week 4. Finish Augmentin Electronic Signature(s) Signed: 03/02/2021 5:04:36 PM By: Kalman Shan DO Entered By: Kalman Shan on 03/02/2021 17:03:48 -------------------------------------------------------------------------------- HxROS Details Patient Name: Date of Service: [name], [name] 03/02/2021 3:45 PM Medical Record Number: [id number] Patient Account Number: [id number] Date of  Birth/Sex: Treating RN: 03-17-1935 (86 y.o. Debby Bud Primary Care Provider: Su Monks Other Clinician: Referring Provider: Treating Provider/Extender: Kalman Shan MYA TT, JENNIFER Weeks in Treatment: 1 Unable to Obtain Patient History due to Dementia Information Obtained From Patient Eyes Medical History: Negative for: Cataracts; Glaucoma; Optic Neuritis Ear/Nose/Mouth/Throat Medical History: Negative for: Chronic sinus problems/congestion; Middle ear problems Hematologic/Lymphatic Medical History: Negative for: Anemia; Hemophilia; Human Immunodeficiency Virus; Lymphedema; Sickle Cell Disease Respiratory Medical History: Negative for: Aspiration; Asthma; Chronic Obstructive Pulmonary Disease (COPD); Pneumothorax; Sleep Apnea; Tuberculosis Cardiovascular Medical History: Positive for: Hypertension; Myocardial Infarction - 2019 Negative for: Angina; Arrhythmia; Congestive Heart Failure; Coronary Artery Disease; Deep Vein Thrombosis; Hypotension; Peripheral Arterial Disease; Peripheral Venous Disease; Phlebitis; Vasculitis Gastrointestinal Medical History: Positive for: Crohns Negative for: Cirrhosis ; Colitis; Hepatitis A; Hepatitis B; Hepatitis C Past Medical History Notes: incontienent Endocrine Medical History: Negative for: Type I Diabetes; Type II Diabetes Genitourinary Medical History: Negative for: End Stage Renal Disease Past Medical History Notes: incontinent; CKD stage 4 Immunological Medical History: Negative for: Lupus Erythematosus; Raynauds; Scleroderma Integumentary (Skin) Medical History: Negative for: History of Burn Musculoskeletal Medical History: Negative for: Gout; Rheumatoid Arthritis; Osteoarthritis; Osteomyelitis Neurologic Medical History: Positive for: Dementia Negative for: Neuropathy; Quadriplegia; Paraplegia; Seizure Disorder Past Medical History Notes: hx TIA; alzheimer's Oncologic Medical History: Negative  for: Received Chemotherapy; Received Radiation Psychiatric Medical History: Negative for: Anorexia/bulimia; Confinement Anxiety Immunizations Pneumococcal Vaccine: Received Pneumococcal Vaccination: No Implantable Devices None Hospitalization / Surgery History Type of Hospitalization/Surgery left heart cath and coronary angiography cardiac cath EGD colectomy dilation and curettage of uterus lap right hemicolectomy Family and Social History Unknown History: Yes; Never smoker; Marital Status - Married; Alcohol Use: Never; Drug Use: No History; Caffeine Use: Never; Financial Concerns: No; Food, Clothing or Shelter Needs: No; Support System Lacking: No; Transportation Concerns: No Electronic Signature(s) Signed: 03/02/2021 5:04:36 PM By: Kalman Shan DO Signed: 03/02/2021 6:00:49 PM By: Deon Pilling Entered By: Kalman Shan on 03/02/2021 16:53:56 -------------------------------------------------------------------------------- SuperBill Details Patient Name: Date of Service: [name], [name] 03/02/2021 Medical Record Number: [id number] Patient Account Number: [id number] Date of Birth/Sex: Treating RN: 10-24-1934 (86 y.o. Debby Bud Primary Care Provider: Su Monks Other Clinician: Referring Provider: Treating Provider/Extender: Kalman Shan MYA TT, JENNIFER Weeks in Treatment: 1 Diagnosis Coding ICD-10 Codes Code Description [phone] Pressure ulcer of left hip, stage 3 G30.9 Alzheimer's disease, unspecified I10 Essential (primary) hypertension N18.9 Chronic kidney disease, unspecified Facility Procedures CPT4 Code: [id number] Description: [phone] - DEBRIDE WOUND 1ST 20 SQ CM OR < ICD-10 Diagnosis Description L89.223 Pressure ulcer of left hip, stage 3 Modifier: Quantity: 1 Physician Procedures : CPT4 Code Description Modifier [id number] - WC PHYS LEVEL 3 - EST PT ICD-10 Diagnosis Description L89.223 Pressure ulcer of left hip, stage 3 G30.9  Alzheimer's disease, unspecified I10 Essential (primary) hypertension N18.9 Chronic kidney disease,  unspecified Quantity: 1 :  [id number] - WC PHYS DEBR WO ANESTH 20 SQ CM ICD-10 Diagnosis Description L89.223 Pressure ulcer of left hip, stage 3 Quantity: 1 Electronic Signature(s) Signed: 03/02/2021 5:04:36 PM By: Kalman Shan DO Entered By: Kalman Shan on 03/02/2021 17:04:06

## 2021-03-02 NOTE — Progress Notes (Addendum)
CAMIAH, HUMM ([id number]) Visit Report for 03/02/2021 Arrival Information Details Patient Name: Date of Service: [name], Rhonda [place] [name] 03/02/2021 3:45 PM Medical Record Number: [id number] Patient Account Number: [id number] Date of Birth/Sex: Treating RN: [birth year] (86 y.o. [name] Primary Care Dorr Perrot: Su Monks Other Clinician: Referring Rober Skeels: Treating Falon Huesca/Extender: Kalman Shan MYA TT, JENNIFER Weeks in Treatment: 1 Visit Information History Since Last Visit Added or deleted any medications: No Patient Arrived: Wheel Chair Any new allergies or adverse reactions: No Arrival Time: 16:16 Had a fall or experienced change in No Accompanied By: son activities of daily living that may affect Transfer Assistance: Manual risk of falls: Patient Identification Verified: Yes Signs or symptoms of abuse/neglect since last visito No Secondary Verification Process Completed: Yes Hospitalized since last visit: No Patient Requires Transmission-Based Precautions: No Implantable device outside of the clinic excluding No Patient Has Alerts: No cellular tissue based products placed in the center since last visit: Has Dressing in Place as Prescribed: Yes Pain Present Now: No Electronic Signature(s) Signed: 03/02/2021 5:39:50 PM By: Lorrin Jackson Entered By: Lorrin Jackson on 03/02/2021 16:17:45 -------------------------------------------------------------------------------- Lower Extremity Assessment Details Patient Name: Date of Service: [name], Rhonda [name] 03/02/2021 3:45 PM Medical Record Number: [id number] Patient Account Number: [id number] Date of Birth/Sex: Treating RN: May 07, 1935 (86 y.o. [name] Primary Care Princesa Willig: Su Monks Other Clinician: Referring Rayette Mogg: Treating Byrdie Miyazaki/Extender: Kalman Shan MYA TT, JENNIFER Weeks in Treatment: 1 Electronic Signature(s) Signed: 03/02/2021 5:39:50 PM By: Lorrin Jackson Entered By:  Lorrin Jackson on 03/02/2021 16:18:40 -------------------------------------------------------------------------------- Multi Wound Chart Details Patient Name: Date of Service: [name], Rhonda [name] 03/02/2021 3:45 PM Medical Record Number: [id number] Patient Account Number: [id number] Date of Birth/Sex: Treating RN: April 05, 1935 (85 y.o. Debby Bud Primary Care Javonn Gauger: Su Monks Other Clinician: Referring Kodie Pick: Treating Genny Caulder/Extender: Kalman Shan MYA TT, JENNIFER Weeks in Treatment: 1 Vital Signs Height(in): 63 Pulse(bpm): 89 Weight(lbs): 80 Blood Pressure(mmHg): 111/76 Body Mass Index(BMI): 14 Temperature(F): Respiratory Rate(breaths/min): 18 Photos: [1:No Photos Left Trochanter] [N/A:N/A N/A] Wound Location: [1:Gradually Appeared] [N/A:N/A] Wounding Event: [1:Pressure Ulcer] [N/A:N/A] Primary Etiology: [1:Hypertension, Myocardial Infarction, N/A] Comorbid History: [1:Crohns, Dementia 02/05/2021] [N/A:N/A] Date Acquired: [1:1] [N/A:N/A] Weeks of Treatment: [1:Open] [N/A:N/A] Wound Status: [1:5x2.8x1] [N/A:N/A] Measurements L x W x D (cm) [1:10.996] [N/A:N/A] A (cm) : rea [1:10.996] [N/A:N/A] Volume (cm) : [1:6.70%] [N/A:N/A] % Reduction in A [1:rea: 53.30%] [N/A:N/A] % Reduction in Volume: [1:9] Position 1 (o'clock): [1:4] Maximum Distance 1 (cm): [1:Yes] [N/A:N/A] Tunneling: [1:Category/Stage III] [N/A:N/A] Classification: [1:Large] [N/A:N/A] Exudate A mount: [1:Serosanguineous] [N/A:N/A] Exudate Type: [1:red, brown] [N/A:N/A] Exudate Color: [1:Distinct, outline attached] [N/A:N/A] Wound Margin: [1:Large (67-100%)] [N/A:N/A] Granulation A mount: [1:Red, Pink] [N/A:N/A] Granulation Quality: [1:Small (1-33%)] [N/A:N/A] Necrotic A mount: [1:Fat Layer (Subcutaneous Tissue): Yes N/A] Exposed Structures: [1:Fascia: No Tendon: No Muscle: No Joint: No Bone: No Small (1-33%)] [N/A:N/A] Epithelialization: [1:Debridement - Selective/Open Wound  N/A] Debridement: Pre-procedure Verification/Time Out 16:30 [N/A:N/A] Taken: [1:Lidocaine 4% Topical Solution] [N/A:N/A] Pain Control: [1:Slough] [N/A:N/A] Tissue Debrided: [1:Non-Viable Tissue] [N/A:N/A] Level: [1:1] [N/A:N/A] Debridement A (sq cm): [1:rea Forceps, Scissors] [N/A:N/A] Instrument: [1:Minimum] [N/A:N/A] Bleeding: [1:Pressure] [N/A:N/A] Hemostasis A chieved: [1:0] [N/A:N/A] Procedural Pain: [1:0] [N/A:N/A] Post Procedural Pain: [1:Procedure was tolerated well] [N/A:N/A] Debridement Treatment Response: [1:5x2.8x1] [N/A:N/A] Post Debridement Measurements L x W x D (cm) [1:10.996] [N/A:N/A] Post Debridement Volume: (cm) [1:Category/Stage III] [N/A:N/A] Post Debridement Stage: [1:Debridement] [N/A:N/A] Treatment Notes Electronic Signature(s) Signed: 03/02/2021 5:04:36 PM By: Kalman Shan DO Signed: 03/02/2021 6:00:49 PM By: Deon Pilling  Entered By: Kalman Shan on 03/02/2021 16:52:14 -------------------------------------------------------------------------------- Multi-Disciplinary Care Plan Details Patient Name: Date of Service: [name], Rhonda [name] 03/02/2021 3:45 PM Medical Record Number: [id number] Patient Account Number: [id number] Date of Birth/Sex: Treating RN: Mar 03, 1935 (86 y.o. Debby Bud Primary Care Levie Owensby: Su Monks Other Clinician: Referring Yalonda Sample: Treating Ivelisse Culverhouse/Extender: Kalman Shan MYA TT, JENNIFER Weeks in Treatment: 1 Multidisciplinary Care Plan reviewed with physician Active Inactive Abuse / Safety / Falls / Self Care Management Nursing Diagnoses: Potential for falls Potential for injury related to falls Goals: Patient/caregiver will verbalize/demonstrate measures taken to improve the patient's personal safety Date Initiated: 02/20/2021 Target Resolution Date: 03/24/2021 Goal Status: Active Patient/caregiver will verbalize/demonstrate measures taken to prevent injury and/or falls Date Initiated:  02/20/2021 Target Resolution Date: 03/24/2021 Goal Status: Active Interventions: Assess Activities of Daily Living upon admission and as needed Assess fall risk on admission and as needed Assess: immobility, friction, shearing, incontinence upon admission and as needed Assess impairment of mobility on admission and as needed per policy Assess personal safety and home safety (as indicated) on admission and as needed Assess self care needs on admission and as needed Provide education on fall prevention Provide education on personal and home safety Notes: Nutrition Nursing Diagnoses: Potential for alteratiion in Nutrition/Potential for imbalanced nutrition Goals: Patient/caregiver agrees to and verbalizes understanding of need to use nutritional supplements and/or vitamins as prescribed Date Initiated: 02/20/2021 Target Resolution Date: 03/24/2021 Goal Status: Active Interventions: Assess patient nutrition upon admission and as needed per policy Provide education on nutrition Treatment Activities: Education provided on Nutrition : 02/20/2021 Notes: Pressure Nursing Diagnoses: Knowledge deficit related to causes and risk factors for pressure ulcer development Knowledge deficit related to management of pressures ulcers Potential for impaired tissue integrity related to pressure, friction, moisture, and shear Goals: Patient/caregiver will verbalize risk factors for pressure ulcer development Date Initiated: 02/20/2021 Target Resolution Date: 03/24/2021 Goal Status: Active Patient/caregiver will verbalize understanding of pressure ulcer management Date Initiated: 02/20/2021 Target Resolution Date: 03/24/2021 Goal Status: Active Interventions: Assess: immobility, friction, shearing, incontinence upon admission and as needed Assess offloading mechanisms upon admission and as needed Assess potential for pressure ulcer upon admission and as needed Provide education on pressure  ulcers Notes: Wound/Skin Impairment Nursing Diagnoses: Impaired tissue integrity Knowledge deficit related to ulceration/compromised skin integrity Goals: Patient/caregiver will verbalize understanding of skin care regimen Date Initiated: 02/20/2021 Target Resolution Date: 03/24/2021 Goal Status: Active Ulcer/skin breakdown will have a volume reduction of 30% by week 4 Date Initiated: 02/20/2021 Target Resolution Date: 03/24/2021 Goal Status: Active Interventions: Assess patient/caregiver ability to obtain necessary supplies Assess patient/caregiver ability to perform ulcer/skin care regimen upon admission and as needed Assess ulceration(s) every visit Provide education on ulcer and skin care Notes: Electronic Signature(s) Signed: 03/02/2021 6:00:49 PM By: Deon Pilling Entered By: Deon Pilling on 03/02/2021 16:33:09 -------------------------------------------------------------------------------- Pain Assessment Details Patient Name: Date of Service: [name], Rhonda [name] 03/02/2021 3:45 PM Medical Record Number: [id number] Patient Account Number: [id number] Date of Birth/Sex: Treating RN: 07-28-1935 (86 y.o. [name] Primary Care Ayane Delancey: Su Monks Other Clinician: Referring Haileyann Staiger: Treating Ayomikun Starling/Extender: Kalman Shan MYA TT, JENNIFER Weeks in Treatment: 1 Active Problems Location of Pain Severity and Description of Pain Patient Has Paino No Site Locations Pain Management and Medication Current Pain Management: Electronic Signature(s) Signed: 03/02/2021 5:39:50 PM By: Lorrin Jackson Entered By: Lorrin Jackson on 03/02/2021 16:18:34 -------------------------------------------------------------------------------- Patient/Caregiver Education Details Patient Name: Date of Service: [name], Rhonda [name] 5/19/2022andnbsp3:45 PM Medical Record  Number: [id number] Patient Account Number: [id number] Date of Birth/Gender: Treating RN: 1935-01-29 (86 y.o. Debby Bud Primary Care Physician: Su Monks Other Clinician: Referring Physician: Treating Physician/Extender: Kalman Shan MYA TT, JENNIFER Weeks in Treatment: 1 Education Assessment Education Provided To: Caregiver son Education Topics Provided Nutrition: Handouts: Nutrition Methods: Explain/Verbal Responses: Reinforcements needed Pressure: Handouts: Pressure Ulcers: Care and Offloading, Pressure Ulcers: Care and Offloading 2 Methods: Explain/Verbal Responses: Reinforcements needed Electronic Signature(s) Signed: 03/02/2021 6:00:49 PM By: Deon Pilling Entered By: Deon Pilling on 03/02/2021 16:33:29 -------------------------------------------------------------------------------- Wound Assessment Details Patient Name: Date of Service: [name], Rhonda [name] 03/02/2021 3:45 PM Medical Record Number: [id number] Patient Account Number: [id number] Date of Birth/Sex: Treating RN: Sep 08, 1935 (86 y.o. [name] Primary Care Brienne Liguori: Su Monks Other Clinician: Referring Jaciel Diem: Treating Eastyn Dattilo/Extender: Kalman Shan MYA TT, JENNIFER Weeks in Treatment: 1 Wound Status Wound Number: 1 Primary Etiology: Pressure Ulcer Wound Location: Left Trochanter Wound Status: Open Wounding Event: Gradually Appeared Comorbid History: Hypertension, Myocardial Infarction, Crohns, Dementia Date Acquired: 02/05/2021 Weeks Of Treatment: 1 Clustered Wound: No Photos Wound Measurements Length: (cm) 5 Width: (cm) 2.8 Depth: (cm) 1 Area: (cm) 10.996 Volume: (cm) 10.996 % Reduction in Area: 6.7% % Reduction in Volume: 53.3% Epithelialization: Small (1-33%) Tunneling: Yes Position (o'clock): 9 Maximum Distance: (cm) 4 Undermining: No Wound Description Classification: Category/Stage III Wound Margin: Distinct, outline attached Exudate Amount: Large Exudate Type: Serosanguineous Exudate Color: red, brown Foul Odor After Cleansing: No Slough/Fibrino  Yes Wound Bed Granulation Amount: Large (67-100%) Exposed Structure Granulation Quality: Red, Pink Fascia Exposed: No Necrotic Amount: Small (1-33%) Fat Layer (Subcutaneous Tissue) Exposed: Yes Necrotic Quality: Adherent Slough Tendon Exposed: No Muscle Exposed: No Joint Exposed: No Bone Exposed: No Treatment Notes Wound #1 (Trochanter) Wound Laterality: Left Cleanser Wound Cleanser Discharge Instruction: Cleanse the wound with wound cleanser or normal saline prior to applying a clean dressing using gauze sponges, not tissue or cotton balls. Soap and Water Discharge Instruction: May shower and wash wound with dial antibacterial soap and water prior to dressing change. Peri-Wound Care Skin Prep Discharge Instruction: Use skin prep as directed Topical Primary Dressing Dakin's solution wet to dry Discharge Instruction: lightly pack with Dakin's wet to dry dressing. In clinic to use anascept wet to dry Secondary Dressing Bordered Gauze, 4x4 in Discharge Instruction: Apply over primary dressing as directed. May also use ABD pad and tape. Secured With Compression Wrap Compression Stockings Environmental education officer) Signed: 03/06/2021 3:41:13 PM By: Sandre Kitty Signed: 03/06/2021 6:04:09 PM By: Lorrin Jackson Signed: 03/06/2021 6:04:09 PM By: Lorrin Jackson Previous Signature: 03/02/2021 5:39:50 PM Version By: Lorrin Jackson Entered By: Sandre Kitty on 03/06/2021 09:04:20 -------------------------------------------------------------------------------- Vitals Details Patient Name: Date of Service: [name], Rhonda [name] 03/02/2021 3:45 PM Medical Record Number: [id number] Patient Account Number: [id number] Date of Birth/Sex: Treating RN: 1935/08/09 (86 y.o. [name] Primary Care Magdalyn Arenivas: Su Monks Other Clinician: Referring Zaira Iacovelli: Treating Jennavie Martinek/Extender: Kalman Shan MYA TT, JENNIFER Weeks in Treatment: 1 Vital Signs Time Taken:  16:17 Pulse (bpm): 89 Height (in): 63 Respiratory Rate (breaths/min): 18 Weight (lbs): 80 Blood Pressure (mmHg): 111/76 Body Mass Index (BMI): 14.2 Reference Range: 80 - 120 mg / dl Electronic Signature(s) Signed: 03/02/2021 5:39:50 PM By: Lorrin Jackson Entered By: Lorrin Jackson on 03/02/2021 16:18:15

## 2021-03-08 ENCOUNTER — Other Ambulatory Visit: Payer: Self-pay | Admitting: Neurology

## 2021-03-09 ENCOUNTER — Encounter (HOSPITAL_BASED_OUTPATIENT_CLINIC_OR_DEPARTMENT_OTHER): Payer: Medicare Other | Admitting: Internal Medicine

## 2021-03-16 ENCOUNTER — Encounter (HOSPITAL_BASED_OUTPATIENT_CLINIC_OR_DEPARTMENT_OTHER): Payer: Medicare Other | Attending: Internal Medicine | Admitting: Internal Medicine

## 2021-03-16 ENCOUNTER — Other Ambulatory Visit: Payer: Self-pay

## 2021-03-16 DIAGNOSIS — L89223 Pressure ulcer of left hip, stage 3: Secondary | ICD-10-CM | POA: Insufficient documentation

## 2021-03-16 DIAGNOSIS — K5090 Crohn's disease, unspecified, without complications: Secondary | ICD-10-CM | POA: Insufficient documentation

## 2021-03-16 DIAGNOSIS — I129 Hypertensive chronic kidney disease with stage 1 through stage 4 chronic kidney disease, or unspecified chronic kidney disease: Secondary | ICD-10-CM | POA: Insufficient documentation

## 2021-03-16 DIAGNOSIS — N189 Chronic kidney disease, unspecified: Secondary | ICD-10-CM | POA: Insufficient documentation

## 2021-03-16 NOTE — Progress Notes (Signed)
Rhonda, Murillo ([id number]) Visit Report for 03/16/2021 Chief Complaint Document Details Patient Name: Date of Service: Rhonda, Murillo [name] 03/16/2021 2:30 PM Medical Record Number: [id number] Patient Account Number: [id number] Date of Birth/Sex: Treating RN: Feb 18, 1935 (86 y.o. [name] Primary Care Provider: Su Monks Other Clinician: Referring Provider: Treating Provider/Extender: Kalman Shan MYA TT, JENNIFER Weeks in Treatment: 3 Information Obtained from: Patient Chief Complaint Left hip wound Electronic Signature(s) Signed: 03/16/2021 4:32:04 PM By: Kalman Shan DO Entered By: Kalman Shan on 03/16/2021 16:27:34 -------------------------------------------------------------------------------- HPI Details Patient Name: Date of Service: Rhonda [name], [name] 03/16/2021 2:30 PM Medical Record Number: [id number] Patient Account Number: [id number] Date of Birth/Sex: Treating RN: 12-Jun-1935 (85 y.o. [name] Primary Care Provider: Su Monks Other Clinician: Referring Provider: Treating Provider/Extender: Kalman Shan MYA TT, JENNIFER Weeks in Treatment: 3 History of Present Illness HPI Description: Admission 5/9 [name] is an 86 year old female with a past medical history of hypertension, hyperlipidemia, CAD s/p MI, and Alzheimer's Disease that presents to the clinic today for A left hip wound. The son is present and helps provide the history. This was noticed about 2 weeks ago and there was a scab to the area. When this was removed there was depth to the wound. It has gotten slightly larger over the past 3 days. Patient denies any fever/chills, nausea/vomiting and states she has been her usual state of health. Per son she has ample at home care . She has an aide that comes in 5 days a week. She is able to walk with support for short distances. This is limited due to her advanced dementia. She usually stays in a chair for most of the day and  then transfers to a bed at night. 5/19; patient presents for 1 week follow-up. Son was present and helped provide the history. She has been taking Augmentin without any issues. They have been applying Santyl daily to the wound. No complaints today. 6/2; patient presents for 1 week follow-up. Son is present and helps provide the history. She has been using Dakin's wet-to-dry. She states she feels well and denies any signs of infection. Electronic Signature(s) Signed: 03/16/2021 4:32:04 PM By: Kalman Shan DO Entered By: Kalman Shan on 03/16/2021 16:27:47 -------------------------------------------------------------------------------- Physical Exam Details Patient Name: Date of Service: [name], [name] 03/16/2021 2:30 PM Medical Record Number: [id number] Patient Account Number: [id number] Date of Birth/Sex: Treating RN: October 15, 1935 (86 y.o. [name] Primary Care Provider: Su Monks Other Clinician: Referring Provider: Treating Provider/Extender: Kalman Shan MYA TT, JENNIFER Weeks in Treatment: 3 Constitutional respirations regular, non-labored and within target range for patient.. Cardiovascular 2+ dorsalis pedis/posterior tibialis pulses. Psychiatric pleasant and cooperative. Notes Left hip: There is an open wound with healthy granulation tissue present. There is significant undermining circumferentially and there is still some necrotic debris. There is improvement in the odor and drainage from last clinic visit. No obvious signs of infection. Electronic Signature(s) Signed: 03/16/2021 4:32:04 PM By: Kalman Shan DO Entered By: Kalman Shan on 03/16/2021 16:28:37 -------------------------------------------------------------------------------- Physician Orders Details Patient Name: Date of Service: Rhonda [name], [name] 03/16/2021 2:30 PM Medical Record Number: [id number] Patient Account Number: [id number] Date of Birth/Sex: Treating RN: 08-31-1935 (86  y.o. [name] Primary Care Provider: Su Monks Other Clinician: Referring Provider: Treating Provider/Extender: Kalman Shan MYA TT, JENNIFER Weeks in Treatment: 3 Verbal / Phone Orders: No Diagnosis Coding ICD-10 Coding Code Description [phone] Pressure ulcer of left hip,  stage 3 G30.9 Alzheimer's disease, unspecified I10 Essential (primary) hypertension N18.9 Chronic kidney disease, unspecified Follow-up Appointments ppointment in 1 week. - with Dr. Heber [place] Return A Bathing/ Shower/ Hygiene May shower and wash wound with soap and water. Off-Loading Gel mattress overlay (Group 1) Roho cushion for wheelchair Turn and reposition every 2 hours Home Health No change in wound care orders this week; continue Home Health for wound care. May utilize formulary equivalent dressing for wound treatment orders unless otherwise specified. Other Home Health Orders/Instructions: - [facility] Wound Treatment Wound #1 - Trochanter Wound Laterality: Left Cleanser: Soap and Water Other:twice a day/7 Days Discharge Instructions: May shower and wash wound with dial antibacterial soap and water prior to dressing change. Cleanser: Wound Cleanser [facility]) Other:twice a day/7 Days Discharge Instructions: Cleanse the wound with wound cleanser or normal saline prior to applying a clean dressing using gauze sponges, not tissue or cotton balls. Peri-Wound Care: Skin Prep [facility]) Other:twice a day/7 Days Discharge Instructions: Use skin prep as directed Prim Dressing: Dakin's solution [facility]) Other:twice a day/7 Days ary Discharge Instructions: lightly pack with Dakin's wet to dry dressing. Use Anasept gel in clinic. Secondary Dressing: Bordered Gauze, 4x4 in [facility]) Other:twice a day/7 Days Discharge Instructions: Apply over primary dressing as directed. May also use ABD pad and tape. Electronic Signature(s) Signed: 03/16/2021 4:32:04 PM By: Kalman Shan  DO Entered By: Kalman Shan on 03/16/2021 16:29:02 -------------------------------------------------------------------------------- Problem List Details Patient Name: Date of Service: Rhonda [name], [name] 03/16/2021 2:30 PM Medical Record Number: [id number] Patient Account Number: [id number] Date of Birth/Sex: Treating RN: January 02, 1935 (86 y.o. [name] Primary Care Provider: Su Monks Other Clinician: Referring Provider: Treating Provider/Extender: Kalman Shan MYA TT, JENNIFER Weeks in Treatment: 3 Active Problems ICD-10 Encounter Code Description Active Date MDM Diagnosis L89.223 Pressure ulcer of left hip, stage 3 02/20/2021 No Yes G30.9 Alzheimer's disease, unspecified 02/20/2021 No Yes I10 Essential (primary) hypertension 02/20/2021 No Yes N18.9 Chronic kidney disease, unspecified 02/20/2021 No Yes Inactive Problems Resolved Problems Electronic Signature(s) Signed: 03/16/2021 4:32:04 PM By: Kalman Shan DO Entered By: Kalman Shan on 03/16/2021 16:27:23 -------------------------------------------------------------------------------- Progress Note Details Patient Name: Date of Service: Rhonda [name], [name] 03/16/2021 2:30 PM Medical Record Number: [id number] Patient Account Number: [id number] Date of Birth/Sex: Treating RN: 1935/02/03 (86 y.o. [name] Primary Care Provider: Su Monks Other Clinician: Referring Provider: Treating Provider/Extender: Kalman Shan MYA TT, JENNIFER Weeks in Treatment: 3 Subjective Chief Complaint Information obtained from Patient Left hip wound History of Present Illness (HPI) Admission 5/9 Ms. Linney is an 86 year old female with a past medical history of hypertension, hyperlipidemia, CAD s/p MI, and Alzheimer's Disease that presents to the clinic today for A left hip wound. The son is present and helps provide the history. This was noticed about 2 weeks ago and there was a scab to the area. When this  was removed there was depth to the wound. It has gotten slightly larger over the past 3 days. Patient denies any fever/chills, nausea/vomiting and states she has been her usual state of health. Per son she has ample at home care . She has an aide that comes in 5 days a week. She is able to walk with support for short distances. This is limited due to her advanced dementia. She usually stays in a chair for most of the day and then transfers to a bed at night. 5/19; patient presents for 1 week follow-up. Son was present  and helped provide the history. She has been taking Augmentin without any issues. They have been applying Santyl daily to the wound. No complaints today. 6/2; patient presents for 1 week follow-up. Son is present and helps provide the history. She has been using Dakin's wet-to-dry. She states she feels well and denies any signs of infection. Patient History Unable to Obtain Patient History due to Dementia. Information obtained from Patient. Family History Unknown History. Social History Never smoker, Marital Status - Married, Alcohol Use - Never, Drug Use - No History, Caffeine Use - Never. Medical History Eyes Denies history of Cataracts, Glaucoma, Optic Neuritis Ear/Nose/Mouth/Throat Denies history of Chronic sinus problems/congestion, Middle ear problems Hematologic/Lymphatic Denies history of Anemia, Hemophilia, Human Immunodeficiency Virus, Lymphedema, Sickle Cell Disease Respiratory Denies history of Aspiration, Asthma, Chronic Obstructive Pulmonary Disease (COPD), Pneumothorax, Sleep Apnea, Tuberculosis Cardiovascular Patient has history of Hypertension, Myocardial Infarction - 2019 Denies history of Angina, Arrhythmia, Congestive Heart Failure, Coronary Artery Disease, Deep Vein Thrombosis, Hypotension, Peripheral Arterial Disease, Peripheral Venous Disease, Phlebitis, Vasculitis Gastrointestinal Patient has history of Crohnoos Denies history of Cirrhosis ,  Colitis, Hepatitis A, Hepatitis B, Hepatitis C Endocrine Denies history of Type I Diabetes, Type II Diabetes Genitourinary Denies history of End Stage Renal Disease Immunological Denies history of Lupus Erythematosus, Raynaudoos, Scleroderma Integumentary (Skin) Denies history of History of Burn Musculoskeletal Denies history of Gout, Rheumatoid Arthritis, Osteoarthritis, Osteomyelitis Neurologic Patient has history of Dementia Denies history of Neuropathy, Quadriplegia, Paraplegia, Seizure Disorder Oncologic Denies history of Received Chemotherapy, Received Radiation Psychiatric Denies history of Anorexia/bulimia, Confinement Anxiety Hospitalization/Surgery History - left heart cath and coronary angiography. - cardiac cath. - EGD. - colectomy. - dilation and curettage of uterus. - lap right hemicolectomy. Medical A Surgical History Notes nd Gastrointestinal incontienent Genitourinary incontinent; CKD stage 4 Neurologic hx TIA; alzheimer's Objective Constitutional respirations regular, non-labored and within target range for patient.. Vitals Time Taken: 3:47 PM, Height: 63 in, Weight: 80 lbs, BMI: 14.2, Temperature: 98.4 F, Pulse: 84 bpm, Respiratory Rate: 20 breaths/min, Blood Pressure: 118/72 mmHg. Cardiovascular 2+ dorsalis pedis/posterior tibialis pulses. Psychiatric pleasant and cooperative. General Notes: Left hip: There is an open wound with healthy granulation tissue present. There is significant undermining circumferentially and there is still some necrotic debris. There is improvement in the odor and drainage from last clinic visit. No obvious signs of infection. Integumentary (Hair, Skin) Wound #1 status is Open. Original cause of wound was Gradually Appeared. The date acquired was: 02/05/2021. The wound has been in treatment 3 weeks. The wound is located on the Left Trochanter. The wound measures 1.5cm length x 1.6cm width x 1.1cm depth; 1.885cm^2 area and  2.073cm^3 volume. There is Fat Layer (Subcutaneous Tissue) exposed. There is no undermining noted, however, there is tunneling at 9:00 with a maximum distance of 2.5cm. There is a large amount of serosanguineous drainage noted. The wound margin is distinct with the outline attached to the wound base. There is large (67-100%) red, pink granulation within the wound bed. There is a small (1-33%) amount of necrotic tissue within the wound bed including Adherent Slough. Assessment Active Problems ICD-10 Pressure ulcer of left hip, stage 3 Alzheimer's disease, unspecified Essential (primary) hypertension Chronic kidney disease, unspecified There is improvement in appearance and drainage to the wound. However, there is still significant undermining. I still was able to see some necrotic debris in the undermining although improved. I think she could still benefit from another week of Dakin's wet to dry. There is a small area of nonviable  tissue on the surface that could benefit from Heil. She still has the original tube and I recommended just putting a little bit on it every day until it comes off. Next week I will likely switch to silver alginate packing to the area. There were no signs of infection on exam. Plan Follow-up Appointments: Return Appointment in 1 week. - with Dr. Heber [place] Bathing/ Shower/ Hygiene: May shower and wash wound with soap and water. Off-Loading: Gel mattress overlay (Group 1) Roho cushion for wheelchair Turn and reposition every 2 hours Home Health: No change in wound care orders this week; continue Home Health for wound care. May utilize formulary equivalent dressing for wound treatment orders unless otherwise specified. Other Home Health Orders/Instructions: [facility] WOUND #1: - Trochanter Wound Laterality: Left Cleanser: Soap and Water Other:twice a day/7 Days Discharge Instructions: May shower and wash wound with dial antibacterial soap and water prior to dressing  change. Cleanser: Wound Cleanser [facility]) Other:twice a day/7 Days Discharge Instructions: Cleanse the wound with wound cleanser or normal saline prior to applying a clean dressing using gauze sponges, not tissue or cotton balls. Peri-Wound Care: Skin Prep [facility]) Other:twice a day/7 Days Discharge Instructions: Use skin prep as directed Prim Dressing: Dakin's solution [facility]) Other:twice a day/7 Days ary Discharge Instructions: lightly pack with Dakin's wet to dry dressing. Use Anasept gel in clinic. Secondary Dressing: Bordered Gauze, 4x4 in [facility]) Other:twice a day/7 Days Discharge Instructions: Apply over primary dressing as directed. May also use ABD pad and tape. 1. Dakin's wet-to-dry 2. Santyl to a small area of nonviable tissue 3. Follow-up in 1 week Electronic Signature(s) Signed: 03/16/2021 4:32:04 PM By: Kalman Shan DO Entered By: Kalman Shan on 03/16/2021 16:31:29 -------------------------------------------------------------------------------- HxROS Details Patient Name: Date of Service: Rhonda [name], [name] 03/16/2021 2:30 PM Medical Record Number: [id number] Patient Account Number: [id number] Date of Birth/Sex: Treating RN: [birth year] (86 y.o. [name] Primary Care Provider: Su Monks Other Clinician: Referring Provider: Treating Provider/Extender: Kalman Shan MYA TT, JENNIFER Weeks in Treatment: 3 Unable to Obtain Patient History due to Dementia Information Obtained From Patient Eyes Medical History: Negative for: Cataracts; Glaucoma; Optic Neuritis Ear/Nose/Mouth/Throat Medical History: Negative for: Chronic sinus problems/congestion; Middle ear problems Hematologic/Lymphatic Medical History: Negative for: Anemia; Hemophilia; Human Immunodeficiency Virus; Lymphedema; Sickle Cell Disease Respiratory Medical History: Negative for: Aspiration; Asthma; Chronic Obstructive Pulmonary Disease (COPD); Pneumothorax;  Sleep Apnea; Tuberculosis Cardiovascular Medical History: Positive for: Hypertension; Myocardial Infarction - 2019 Negative for: Angina; Arrhythmia; Congestive Heart Failure; Coronary Artery Disease; Deep Vein Thrombosis; Hypotension; Peripheral Arterial Disease; Peripheral Venous Disease; Phlebitis; Vasculitis Gastrointestinal Medical History: Positive for: Crohns Negative for: Cirrhosis ; Colitis; Hepatitis A; Hepatitis B; Hepatitis C Past Medical History Notes: incontienent Endocrine Medical History: Negative for: Type I Diabetes; Type II Diabetes Genitourinary Medical History: Negative for: End Stage Renal Disease Past Medical History Notes: incontinent; CKD stage 4 Immunological Medical History: Negative for: Lupus Erythematosus; Raynauds; Scleroderma Integumentary (Skin) Medical History: Negative for: History of Burn Musculoskeletal Medical History: Negative for: Gout; Rheumatoid Arthritis; Osteoarthritis; Osteomyelitis Neurologic Medical History: Positive for: Dementia Negative for: Neuropathy; Quadriplegia; Paraplegia; Seizure Disorder Past Medical History Notes: hx TIA; alzheimer's Oncologic Medical History: Negative for: Received Chemotherapy; Received Radiation Psychiatric Medical History: Negative for: Anorexia/bulimia; Confinement Anxiety Immunizations Pneumococcal Vaccine: Received Pneumococcal Vaccination: No Implantable Devices None Hospitalization / Surgery History Type of Hospitalization/Surgery left heart cath and coronary angiography cardiac cath EGD colectomy dilation and curettage of uterus lap right hemicolectomy Family and  Social History Unknown History: Yes; Never smoker; Marital Status - Married; Alcohol Use: Never; Drug Use: No History; Caffeine Use: Never; Financial Concerns: No; Food, Clothing or Shelter Needs: No; Support System Lacking: No; Transportation Concerns: No Electronic Signature(s) Signed: 03/16/2021 4:32:04 PM By:  Kalman Shan DO Signed: 03/16/2021 5:16:10 PM By: Deon Pilling Entered By: Kalman Shan on 03/16/2021 16:27:52 -------------------------------------------------------------------------------- SuperBill Details Patient Name: Date of Service: Rhonda [name], [name] 03/16/2021 Medical Record Number: [id number] Patient Account Number: [id number] Date of Birth/Sex: Treating RN: November 05, 1934 (86 y.o. [name] Primary Care Provider: Su Monks Other Clinician: Referring Provider: Treating Provider/Extender: Kalman Shan MYA TT, JENNIFER Weeks in Treatment: 3 Diagnosis Coding ICD-10 Codes Code Description [phone] Pressure ulcer of left hip, stage 3 G30.9 Alzheimer's disease, unspecified I10 Essential (primary) hypertension N18.9 Chronic kidney disease, unspecified Physician Procedures : CPT4 Code Description Modifier [id number] - WC PHYS LEVEL 3 - EST PT ICD-10 Diagnosis Description L89.223 Pressure ulcer of left hip, stage 3 G30.9 Alzheimer's disease, unspecified Quantity: 1 Electronic Signature(s) Signed: 03/16/2021 4:32:04 PM By: Kalman Shan DO Entered By: Kalman Shan on 03/16/2021 16:31:43

## 2021-03-22 NOTE — Progress Notes (Signed)
[name], [name] ([id number]) Visit Report for 03/16/2021 Arrival Information Details Patient Name: Date of Service: [name], [name] G. 03/16/2021 2:30 PM Medical Record Number: [id number] Patient Account Number: [id number] Date of Birth/Sex: Treating RN: Jan 01, 1935 (86 y.o. [name] Primary Care Annamaria Salah: Su Monks Other Clinician: Referring Alvan Culpepper: Treating Gleb Mcguire/Extender: Kalman Shan [name] Weeks in Treatment: 3 Visit Information History Since Last Visit Added or deleted any medications: No Patient Arrived: Wheel Chair Any new allergies or adverse reactions: No Arrival Time: 15:46 Had a fall or experienced change in No Accompanied By: Son activities of daily living that may affect Transfer Assistance: Manual risk of falls: Patient Requires Transmission-Based Precautions: No Signs or symptoms of abuse/neglect since last visito No Patient Has Alerts: No Hospitalized since last visit: No Implantable device outside of the clinic excluding No cellular tissue based products placed in the center since last visit: Has Dressing in Place as Prescribed: Yes Pain Present Now: No Electronic Signature(s) Signed: 03/16/2021 5:54:05 PM By: Lorrin Jackson Entered By: Lorrin Jackson on 03/16/2021 15:47:36 -------------------------------------------------------------------------------- Encounter Discharge Information Details Patient Name: Date of Service: Rhonda Rhonda Dolly, SA Murillo G. 03/16/2021 2:30 PM Medical Record Number: [id number] Patient Account Number: [id number] Date of Birth/Sex: Treating RN: 08/27/1935 (85 y.o. [name] Primary Care Annarae Macnair: Su Monks Other Clinician: Referring Marcquis Ridlon: Treating Amayrany Cafaro/Extender: Kalman Shan [name] Weeks in Treatment: 3 Encounter Discharge Information Items Discharge Condition: Stable Ambulatory Status: Wheelchair Discharge Destination: Home Transportation: Private Auto Accompanied By:  son Schedule Follow-up Appointment: Yes Clinical Summary of Care: Patient Declined Electronic Signature(s) Signed: 03/22/2021 5:57:05 PM By: Levan Hurst RN, BSN Entered By: Levan Hurst on 03/16/2021 17:06:19 -------------------------------------------------------------------------------- Lower Extremity Assessment Details Patient Name: Date of Service: Rhonda [name], SA Murillo G. 03/16/2021 2:30 PM Medical Record Number: [id number] Patient Account Number: [id number] Date of Birth/Sex: Treating RN: 29-Jun-1935 (86 y.o. [name] Primary Care Mahesh Sizemore: Su Monks Other Clinician: Referring Tarri Guilfoil: Treating Donjuan Robison/Extender: Kalman Shan [name] Weeks in Treatment: 3 Electronic Signature(s) Signed: 03/16/2021 5:54:05 PM By: Lorrin Jackson Entered By: Lorrin Jackson on 03/16/2021 15:48:27 -------------------------------------------------------------------------------- Multi Wound Chart Details Patient Name: Date of Service: Rhonda Rhonda Dolly, SA Murillo G. 03/16/2021 2:30 PM Medical Record Number: [id number] Patient Account Number: [id number] Date of Birth/Sex: Treating RN: [birth year] (85 y.o. [name] Primary Care Jayveon Convey: Su Monks Other Clinician: Referring Arsalan Brisbin: Treating Devyne Hauger/Extender: Kalman Shan [name] Weeks in Treatment: 3 Vital Signs Height(in): 63 Pulse(bpm): 84 Weight(lbs): 80 Blood Pressure(mmHg): 118/72 Body Mass Index(BMI): 14 Temperature(F): 98.4 Respiratory Rate(breaths/min): 20 Photos: [1:No Photos Left Trochanter] [N/A:N/A N/A] Wound Location: [1:Gradually Appeared] [N/A:N/A] Wounding Event: [1:Pressure Ulcer] [N/A:N/A] Primary Etiology: [1:Hypertension, Myocardial Infarction, N/A] Comorbid History: [1:Crohns, Dementia 02/05/2021] [N/A:N/A] Date Acquired: [1:3] [N/A:N/A] Weeks of Treatment: [1:Open] [N/A:N/A] Wound Status: [1:1.5x1.6x1.1] [N/A:N/A] Measurements L x W x D (cm) [1:1.885] [N/A:N/A] A (cm)  : rea [1:2.073] [N/A:N/A] Volume (cm) : [1:84.00%] [N/A:N/A] % Reduction in A rea: [1:91.20%] [N/A:N/A] % Reduction in Volume: [1:9] Position 1 (o'clock): [1:2.5] Maximum Distance 1 (cm): [1:Yes] [N/A:N/A] Tunneling: [1:Category/Stage III] [N/A:N/A] Classification: [1:Large] [N/A:N/A] Exudate A mount: [1:Serosanguineous] [N/A:N/A] Exudate Type: [1:red, brown] [N/A:N/A] Exudate Color: [1:Distinct, outline attached] [N/A:N/A] Wound Margin: [1:Large (67-100%)] [N/A:N/A] Granulation A mount: [1:Red, Pink] [N/A:N/A] Granulation Quality: [1:Small (1-33%)] [N/A:N/A] Necrotic A mount: [1:Fat Layer (Subcutaneous Tissue): Yes N/A] Exposed Structures: [1:Fascia: No Tendon: No Muscle: No Joint: No Bone: No Small (1-33%)] [N/A:N/A] Epithelialization: Treatment Notes Electronic Signature(s) Signed: 03/16/2021 4:32:04 PM By: [place],  Janett Billow DO Signed: 03/16/2021 5:16:10 PM By: Deon Pilling Entered By: Kalman Shan on 03/16/2021 16:27:28 -------------------------------------------------------------------------------- Pain Assessment Details Patient Name: Date of Service: Rhonda Abide, SA Murillo G. 03/16/2021 2:30 PM Medical Record Number: [id number] Patient Account Number: [id number] Date of Birth/Sex: Treating RN: September 13, 1935 (86 y.o. [name] Primary Care Ryler Laskowski: Su Monks Other Clinician: Referring Jackelin Correia: Treating Ji Feldner/Extender: Kalman Shan [name] Weeks in Treatment: 3 Active Problems Location of Pain Severity and Description of Pain Patient Has Paino No Site Locations Pain Management and Medication Current Pain Management: Electronic Signature(s) Signed: 03/16/2021 5:54:05 PM By: Lorrin Jackson Entered By: Lorrin Jackson on 03/16/2021 15:48:19 -------------------------------------------------------------------------------- Wound Assessment Details Patient Name: Date of Service: Rhonda Abide, SA Murillo G. 03/16/2021 2:30 PM Medical Record Number:  [id number] Patient Account Number: [id number] Date of Birth/Sex: Treating RN: [birth year] (85 y.o. [name] Primary Care Jelisha Weed: Su Monks Other Clinician: Referring Ayris Carano: Treating Caliann Leckrone/Extender: Kalman Shan [name] Weeks in Treatment: 3 Wound Status Wound Number: 1 Primary Etiology: Pressure Ulcer Wound Location: Left Trochanter Wound Status: Open Wounding Event: Gradually Appeared Comorbid History: Hypertension, Myocardial Infarction, Crohns, Dementia Date Acquired: 02/05/2021 Weeks Of Treatment: 3 Clustered Wound: No Photos Wound Measurements Length: (cm) 1.5 Width: (cm) 1.6 Depth: (cm) 1.1 Area: (cm) 1.885 Volume: (cm) 2.073 % Reduction in Area: 84% % Reduction in Volume: 91.2% Epithelialization: Small (1-33%) Tunneling: Yes Position (o'clock): 9 Maximum Distance: (cm) 2.5 Undermining: No Wound Description Classification: Category/Stage III Wound Margin: Distinct, outline attached Exudate Amount: Large Exudate Type: Serosanguineous Exudate Color: red, brown Foul Odor After Cleansing: No Slough/Fibrino Yes Wound Bed Granulation Amount: Large (67-100%) Exposed Structure Granulation Quality: Red, Pink Fascia Exposed: No Necrotic Amount: Small (1-33%) Fat Layer (Subcutaneous Tissue) Exposed: Yes Necrotic Quality: Adherent Slough Tendon Exposed: No Muscle Exposed: No Joint Exposed: No Bone Exposed: No Treatment Notes Wound #1 (Trochanter) Wound Laterality: Left Cleanser Wound Cleanser Discharge Instruction: Cleanse the wound with wound cleanser or normal saline prior to applying a clean dressing using gauze sponges, not tissue or cotton balls. Soap and Water Discharge Instruction: May shower and wash wound with dial antibacterial soap and water prior to dressing change. Peri-Wound Care Skin Prep Discharge Instruction: Use skin prep as directed Topical Primary Dressing Dakin's solution Discharge Instruction:  lightly pack with Dakin's wet to dry dressing. Use Anasept gel in clinic. Secondary Dressing Bordered Gauze, 4x4 in Discharge Instruction: Apply over primary dressing as directed. May also use ABD pad and tape. Secured With Compression Wrap Compression Stockings Environmental education officer) Signed: 03/16/2021 5:54:05 PM By: Lorrin Jackson Signed: 03/17/2021 5:48:41 PM By: Sandre Kitty Entered By: Sandre Kitty on 03/16/2021 16:58:10 -------------------------------------------------------------------------------- Vitals Details Patient Name: Date of Service: Rhonda [name], SA Murillo G. 03/16/2021 2:30 PM Medical Record Number: [id number] Patient Account Number: [id number] Date of Birth/Sex: Treating RN: Jun 13, 1935 (86 y.o. [name] Primary Care Jobani Sabado: Su Monks Other Clinician: Referring Clayton Bosserman: Treating Adela Esteban/Extender: Kalman Shan [name] Weeks in Treatment: 3 Vital Signs Time Taken: 15:47 Temperature (F): 98.4 Height (in): 63 Pulse (bpm): 84 Weight (lbs): 80 Respiratory Rate (breaths/min): 20 Body Mass Index (BMI): 14.2 Blood Pressure (mmHg): 118/72 Reference Range: 80 - 120 mg / dl Electronic Signature(s) Signed: 03/16/2021 5:54:05 PM By: Lorrin Jackson Entered By: Lorrin Jackson on 03/16/2021 15:48:01

## 2021-03-23 ENCOUNTER — Other Ambulatory Visit: Payer: Self-pay

## 2021-03-23 ENCOUNTER — Encounter (HOSPITAL_BASED_OUTPATIENT_CLINIC_OR_DEPARTMENT_OTHER): Payer: Medicare Other | Admitting: Internal Medicine

## 2021-03-23 DIAGNOSIS — I129 Hypertensive chronic kidney disease with stage 1 through stage 4 chronic kidney disease, or unspecified chronic kidney disease: Secondary | ICD-10-CM | POA: Diagnosis not present

## 2021-03-23 DIAGNOSIS — K5090 Crohn's disease, unspecified, without complications: Secondary | ICD-10-CM | POA: Diagnosis not present

## 2021-03-23 DIAGNOSIS — L89223 Pressure ulcer of left hip, stage 3: Secondary | ICD-10-CM

## 2021-03-23 DIAGNOSIS — N189 Chronic kidney disease, unspecified: Secondary | ICD-10-CM | POA: Diagnosis not present

## 2021-03-23 NOTE — Progress Notes (Signed)
[name] ([id number]) Visit Report for 03/23/2021 Chief Complaint Document Details Patient Name: Date of Service: [name], [place] Rhonda G. 03/23/2021 2:15 PM Medical Record Number: [id number] Patient Account Number: [id number] Date of Birth/Sex: Treating RN: 25-Nov-1934 (86 y.o. [name] Primary Care Provider: Su Monks Other Clinician: Referring Provider: Treating Provider/Extender: Kalman Shan MYA TT, JENNIFER Weeks in Treatment: 4 Information Obtained from: Patient Chief Complaint Left hip wound Electronic Signature(s) Signed: 03/23/2021 4:32:37 PM By: Kalman Shan DO Entered By: Kalman Shan on 03/23/2021 15:39:41 -------------------------------------------------------------------------------- Debridement Details Patient Name: Date of Service: MA [name], Rhonda Rhonda G. 03/23/2021 2:15 PM Medical Record Number: [id number] Patient Account Number: [id number] Date of Birth/Sex: Treating RN: 01-17-1935 (85 y.o. [name], Meta.Reding Primary Care Provider: Su Monks Other Clinician: Referring Provider: Treating Provider/Extender: Kalman Shan MYA TT, JENNIFER Weeks in Treatment: 4 Debridement Performed for Assessment: Wound #1 Left Trochanter Performed By: Physician Kalman Shan, DO Debridement Type: Debridement Level of Consciousness (Pre-procedure): Awake and Alert Pre-procedure Verification/Time Out Yes - 14:45 Taken: Start Time: 14:46 Pain Control: Lidocaine 4% T opical Solution T Area Debrided (L x W): otal 1.5 (cm) x 1.2 (cm) = 1.8 (cm) Tissue and other material debrided: Viable, Non-Viable, Slough, Subcutaneous, Skin: Dermis , Skin: Epidermis, Fibrin/Exudate, Slough Level: Skin/Subcutaneous Tissue Debridement Description: Excisional Instrument: Curette, Forceps, Scissors Bleeding: Minimum Hemostasis Achieved: Pressure End Time: 14:52 Procedural Pain: 0 Post Procedural Pain: 0 Response to Treatment: Procedure was tolerated well Level of  Consciousness (Post- Awake and Alert procedure): Post Debridement Measurements of Total Wound Length: (cm) 1.5 Stage: Category/Stage III Width: (cm) 1.2 Depth: (cm) 1.9 Volume: (cm) 2.686 Character of Wound/Ulcer Post Debridement: Requires Further Debridement Post Procedure Diagnosis Same as Pre-procedure Electronic Signature(s) Signed: 03/23/2021 4:32:37 PM By: Kalman Shan DO Signed: 03/23/2021 6:02:04 PM By: Deon Pilling Entered By: Deon Pilling on 03/23/2021 14:52:23 -------------------------------------------------------------------------------- HPI Details Patient Name: Date of Service: [name], Rhonda Rhonda G. 03/23/2021 2:15 PM Medical Record Number: [id number] Patient Account Number: [id number] Date of Birth/Sex: Treating RN: 1935-04-15 (86 y.o. [name] Primary Care Provider: Su Monks Other Clinician: Referring Provider: Treating Provider/Extender: Kalman Shan MYA TT, JENNIFER Weeks in Treatment: 4 History of Present Illness HPI Description: Admission 5/9 [name] is an 86 year old female with a past medical history of hypertension, hyperlipidemia, CAD s/p MI, and Alzheimer's Disease that presents to the clinic today for A left hip wound. The son is present and helps provide the history. This was noticed about 2 weeks ago and there was a scab to the area. When this was removed there was depth to the wound. It has gotten slightly larger over the past 3 days. Patient denies any fever/chills, nausea/vomiting and states she has been her usual state of health. Per son she has ample at home care . She has an aide that comes in 5 days a week. She is able to walk with support for short distances. This is limited due to her advanced dementia. She usually stays in a chair for most of the day and then transfers to a bed at night. 5/19; patient presents for 1 week follow-up. Son was present and helped provide the history. She has been taking Augmentin without any  issues. They have been applying Santyl daily to the wound. No complaints today. 6/2; patient presents for 1 week follow-up. Son is present and helps provide the history. She has been using Dakin's wet-to-dry. She states she feels well and denies any signs of infection.  6/9; patient presents for 1 week follow-up. Son is present and helps provide the history. He continues to do Dakin's wet-to-dry. Overall patient feels well and denies signs of infection. She is using her airflow mattress for about 12 hours a day. Electronic Signature(s) Signed: 03/23/2021 4:32:37 PM By: Kalman Shan DO Entered By: Kalman Shan on 03/23/2021 15:41:00 -------------------------------------------------------------------------------- Physical Exam Details Patient Name: Date of Service: MA [name], Rhonda Rhonda G. 03/23/2021 2:15 PM Medical Record Number: [id number] Patient Account Number: [id number] Date of Birth/Sex: Treating RN: 1935/04/26 (86 y.o. [name] Primary Care Provider: Su Monks Other Clinician: Referring Provider: Treating Provider/Extender: Kalman Shan MYA TT, JENNIFER Weeks in Treatment: 4 Constitutional respirations regular, non-labored and within target range for patient.. Notes Hip: There is an open wound with some granulation tissue present and significant undermining circumferentially with necrotic debris. There is still some yellowish drainage with mild odor. No obvious signs of infection. Electronic Signature(s) Signed: 03/23/2021 4:32:37 PM By: Kalman Shan DO Entered By: Kalman Shan on 03/23/2021 16:05:20 -------------------------------------------------------------------------------- Physician Orders Details Patient Name: Date of Service: [name], Rhonda Rhonda G. 03/23/2021 2:15 PM Medical Record Number: [id number] Patient Account Number: [id number] Date of Birth/Sex: Treating RN: Jan 06, 1935 (86 y.o. [name] Primary Care Provider: Su Monks Other  Clinician: Referring Provider: Treating Provider/Extender: Kalman Shan MYA TT, JENNIFER Weeks in Treatment: 4 Verbal / Phone Orders: No Diagnosis Coding ICD-10 Coding Code Description [phone] Pressure ulcer of left hip, stage 3 G30.9 Alzheimer's disease, unspecified I10 Essential (primary) hypertension N18.9 Chronic kidney disease, unspecified Follow-up Appointments ppointment in 2 weeks. - Dr. Heber [place] Return A Bathing/ Shower/ Hygiene May shower and wash wound with soap and water. Off-Loading Gel mattress overlay (Group 1) Roho cushion for wheelchair Turn and reposition every 2 hours Home Health No change in wound care orders this week; continue Home Health for wound care. May utilize formulary equivalent dressing for wound treatment orders unless otherwise specified. - Twice a week by home health and all other days by family member. Other Home Health Orders/Instructions: - [facility] Wound Treatment Wound #1 - Trochanter Wound Laterality: Left Cleanser: Soap and Water 1 x Per Day/7 Days Discharge Instructions: May shower and wash wound with dial antibacterial soap and water prior to dressing change. Cleanser: Wound Cleanser (Home Health) 1 x Per Day/7 Days Discharge Instructions: Cleanse the wound with wound cleanser or normal saline prior to applying a clean dressing using gauze sponges, not tissue or cotton balls. Peri-Wound Care: Skin Prep (Home Health) 1 x Per Day/7 Days Discharge Instructions: Use skin prep as directed Prim Dressing: Dakin's solution ([place]) 1 x Per Day/7 Days ary Discharge Instructions: lightly pack with Dakin's wet to dry dressing. Use Anasept gel in clinic. Secondary Dressing: Bordered Gauze, 4x4 in (Home Health) 1 x Per Day/7 Days Discharge Instructions: Apply over primary dressing as directed. May also use ABD pad and tape. [facility] x-ray left trochanter/hip area - x-ray of left trochanter/hip area related to nonhealing pressure  ulcer to left trochanter looking for infection. - (ICD10 R15.400 - Pressure ulcer of left hip, stage 3) Patient Medications llergies: Sulfa (Sulfonamide Antibiotics) A Notifications Medication Indication Start End 03/23/2021 Dakin's Solution DOSE 1 - miscellaneous 0.25 % solution - wet to dry twice daily Electronic Signature(s) Signed: 03/23/2021 4:37:36 PM By: Kalman Shan DO Previous Signature: 03/23/2021 4:32:37 PM Version By: Kalman Shan DO Entered By: Kalman Shan on 03/23/2021 16:37:36 Prescription 03/23/2021 -------------------------------------------------------------------------------- Rush Barer Kalman Shan DO Patient Name: Provider: January 08, 1935 [id number]  Date of Birth: NPI#Wanda Plump [id number] Sex: DEA #: [phone] Phone #: License #: [place] Patient Address: [address] [place] [address] , [place], [place] [address] [phone] Allergies Sulfa (Sulfonamide Antibiotics) Provider's Orders x-ray left trochanter/hip area - ICD10: L89.223 - x-ray of left trochanter/hip area related to nonhealing pressure ulcer to left trochanter looking for infection. Hand Signature: Date(s): Electronic Signature(s) Signed: 03/23/2021 5:07:33 PM By: Kalman Shan DO Previous Signature: 03/23/2021 4:32:37 PM Version By: Kalman Shan DO Entered By: Kalman Shan on 03/23/2021 16:37:36 -------------------------------------------------------------------------------- Problem List Details Patient Name: Date of Service: [name], Rhonda Rhonda G. 03/23/2021 2:15 PM Medical Record Number: [id number] Patient Account Number: [id number] Date of Birth/Sex: Treating RN: 07/14/1935 (86 y.o. [name] Primary Care Provider: Su Monks Other Clinician: Referring Provider: Treating Provider/Extender: Kalman Shan MYA TT, JENNIFER Weeks in Treatment: 4 Active  Problems ICD-10 Encounter Code Description Active Date MDM Diagnosis L89.223 Pressure ulcer of left hip, stage 3 02/20/2021 No Yes G30.9 Alzheimer's disease, unspecified 02/20/2021 No Yes I10 Essential (primary) hypertension 02/20/2021 No Yes N18.9 Chronic kidney disease, unspecified 02/20/2021 No Yes Inactive Problems Resolved Problems Electronic Signature(s) Signed: 03/23/2021 4:32:37 PM By: Kalman Shan DO Entered By: Kalman Shan on 03/23/2021 15:39:27 -------------------------------------------------------------------------------- Progress Note Details Patient Name: Date of Service: [name], Rhonda Rhonda G. 03/23/2021 2:15 PM Medical Record Number: [id number] Patient Account Number: [id number] Date of Birth/Sex: Treating RN: [birth year] (86 y.o. [name] Primary Care Provider: Su Monks Other Clinician: Referring Provider: Treating Provider/Extender: Kalman Shan MYA TT, JENNIFER Weeks in Treatment: 4 Subjective Chief Complaint Information obtained from Patient Left hip wound History of Present Illness (HPI) Admission 5/9 Ms. Knisley is an 86 year old female with a past medical history of hypertension, hyperlipidemia, CAD s/p MI, and Alzheimer's Disease that presents to the clinic today for A left hip wound. The son is present and helps provide the history. This was noticed about 2 weeks ago and there was a scab to the area. When this was removed there was depth to the wound. It has gotten slightly larger over the past 3 days. Patient denies any fever/chills, nausea/vomiting and states she has been her usual state of health. Per son she has ample at home care . She has an aide that comes in 5 days a week. She is able to walk with support for short distances. This is limited due to her advanced dementia. She usually stays in a chair for most of the day and then transfers to a bed at night. 5/19; patient presents for 1 week follow-up. Son was present and helped provide  the history. She has been taking Augmentin without any issues. They have been applying Santyl daily to the wound. No complaints today. 6/2; patient presents for 1 week follow-up. Son is present and helps provide the history. She has been using Dakin's wet-to-dry. She states she feels well and denies any signs of infection. 6/9; patient presents for 1 week follow-up. Son is present and helps provide the history. He continues to do Dakin's wet-to-dry. Overall patient feels well and denies signs of infection. She is using her airflow mattress for about 12 hours a day. Patient History Unable to Obtain Patient History due to Dementia. Information obtained from Patient. Family History Unknown History. Social History Never smoker, Marital Status - Married, Alcohol Use - Never, Drug Use - No History, Caffeine Use - Never. Medical  History Eyes Denies history of Cataracts, Glaucoma, Optic Neuritis Ear/Nose/Mouth/Throat Denies history of Chronic sinus problems/congestion, Middle ear problems Hematologic/Lymphatic Denies history of Anemia, Hemophilia, Human Immunodeficiency Virus, Lymphedema, Sickle Cell Disease Respiratory Denies history of Aspiration, Asthma, Chronic Obstructive Pulmonary Disease (COPD), Pneumothorax, Sleep Apnea, Tuberculosis Cardiovascular Patient has history of Hypertension, Myocardial Infarction - 2019 Denies history of Angina, Arrhythmia, Congestive Heart Failure, Coronary Artery Disease, Deep Vein Thrombosis, Hypotension, Peripheral Arterial Disease, Peripheral Venous Disease, Phlebitis, Vasculitis Gastrointestinal Patient has history of Crohnoos Denies history of Cirrhosis , Colitis, Hepatitis A, Hepatitis B, Hepatitis C Endocrine Denies history of Type I Diabetes, Type II Diabetes Genitourinary Denies history of End Stage Renal Disease Immunological Denies history of Lupus Erythematosus, Raynaudoos, Scleroderma Integumentary (Skin) Denies history of History of  Burn Musculoskeletal Denies history of Gout, Rheumatoid Arthritis, Osteoarthritis, Osteomyelitis Neurologic Patient has history of Dementia Denies history of Neuropathy, Quadriplegia, Paraplegia, Seizure Disorder Oncologic Denies history of Received Chemotherapy, Received Radiation Psychiatric Denies history of Anorexia/bulimia, Confinement Anxiety Hospitalization/Surgery History - left heart cath and coronary angiography. - cardiac cath. - EGD. - colectomy. - dilation and curettage of uterus. - lap right hemicolectomy. Medical A Surgical History Notes nd Gastrointestinal incontienent Genitourinary incontinent; CKD stage 4 Neurologic hx TIA; alzheimer's Objective Constitutional respirations regular, non-labored and within target range for patient.. Vitals Time Taken: 2:30 PM, Height: 63 in, Weight: 80 lbs, BMI: 14.2, Temperature: 97.8 F, Pulse: 77 bpm, Respiratory Rate: 18 breaths/min, Blood Pressure: 115/73 mmHg. General Notes: Hip: There is an open wound with some granulation tissue present and significant undermining circumferentially with necrotic debris. There is still some yellowish drainage with mild odor. No obvious signs of infection. Integumentary (Hair, Skin) Wound #1 status is Open. Original cause of wound was Gradually Appeared. The date acquired was: 02/05/2021. The wound has been in treatment 4 weeks. The wound is located on the Left Trochanter. The wound measures 1.5cm length x 1.2cm width x 1.9cm depth; 1.414cm^2 area and 2.686cm^3 volume. There is Fat Layer (Subcutaneous Tissue) exposed. There is no undermining noted, however, there is tunneling at 9:00 with a maximum distance of 2.5cm. There is a large amount of serosanguineous drainage noted. The wound margin is well defined and not attached to the wound base. There is large (67-100%) red, pink granulation within the wound bed. There is a small (1-33%) amount of necrotic tissue within the wound bed including  Adherent Slough. Assessment Active Problems ICD-10 Pressure ulcer of left hip, stage 3 Alzheimer's disease, unspecified Essential (primary) hypertension Chronic kidney disease, unspecified Patient's wound is overall stable. I recommended continue with Dakin's solution wet-to-dry dressings twice daily. Since there has been minimal improvement I like to obtain an x-ray of this area. Currently there are no obvious signs of infection. We went over increasing protein intake and aggressive offloading. We will see her at follow-up in 2 weeks. Procedures Wound #1 Pre-procedure diagnosis of Wound #1 is a Pressure Ulcer located on the Left Trochanter . There was a Excisional Skin/Subcutaneous Tissue Debridement with a total area of 1.8 sq cm performed by Kalman Shan, DO. With the following instrument(s): Curette, Forceps, and Scissors to remove Viable and Non- Viable tissue/material. Material removed includes Subcutaneous Tissue, Slough, Skin: Dermis, Skin: Epidermis, and Fibrin/Exudate after achieving pain control using Lidocaine 4% T opical Solution. A time out was conducted at 14:45, prior to the start of the procedure. A Minimum amount of bleeding was controlled with Pressure. The procedure was tolerated well with a pain level of 0 throughout and  a pain level of 0 following the procedure. Post Debridement Measurements: 1.5cm length x 1.2cm width x 1.9cm depth; 2.686cm^3 volume. Post debridement Stage noted as Category/Stage III. Character of Wound/Ulcer Post Debridement requires further debridement. Post procedure Diagnosis Wound #1: Same as Pre-Procedure Plan Follow-up Appointments: Return Appointment in 2 weeks. - Dr. Heber [place] Bathing/ Shower/ Hygiene: May shower and wash wound with soap and water. Off-Loading: Gel mattress overlay (Group 1) Roho cushion for wheelchair Turn and reposition every 2 hours Home Health: No change in wound care orders this week; continue Home Health for  wound care. May utilize formulary equivalent dressing for wound treatment orders unless otherwise specified. - Twice a week by home health and all other days by family member. Other Home Health Orders/Instructions: Alvis Lemmings ordered were: x-ray left trochanter/hip area - x-ray of left trochanter/hip area related to nonhealing pressure ulcer to left trochanter looking for infection. The following medication(s) was prescribed: Dakin's Solution miscellaneous 0.25 % solution 1 wet to dry twice daily starting 03/23/2021 WOUND #1: - Trochanter Wound Laterality: Left Cleanser: Soap and Water 1 x Per Day/7 Days Discharge Instructions: May shower and wash wound with dial antibacterial soap and water prior to dressing change. Cleanser: Wound Cleanser (Home Health) 1 x Per Day/7 Days Discharge Instructions: Cleanse the wound with wound cleanser or normal saline prior to applying a clean dressing using gauze sponges, not tissue or cotton balls. Peri-Wound Care: Skin Prep (Home Health) 1 x Per Day/7 Days Discharge Instructions: Use skin prep as directed Prim Dressing: Dakin's solution ([place]) 1 x Per Day/7 Days ary Discharge Instructions: lightly pack with Dakin's wet to dry dressing. Use Anasept gel in clinic. Secondary Dressing: Bordered Gauze, 4x4 in (Home Health) 1 x Per Day/7 Days Discharge Instructions: Apply over primary dressing as directed. May also use ABD pad and tape. 1. Dakin's wet-to-dry dressings 2. Left trochanter x-ray/Hip 3. Aggressive offloading Electronic Signature(s) Signed: 03/23/2021 5:07:33 PM By: Kalman Shan DO Previous Signature: 03/23/2021 4:32:37 PM Version By: Kalman Shan DO Entered By: Kalman Shan on 03/23/2021 16:37:51 -------------------------------------------------------------------------------- HxROS Details Patient Name: Date of Service: [name], Rhonda Rhonda G. 03/23/2021 2:15 PM Medical Record Number: [id number] Patient Account Number: [id number] Date of  Birth/Sex: Treating RN: 1935/04/10 (86 y.o. [name] Primary Care Provider: Su Monks Other Clinician: Referring Provider: Treating Provider/Extender: Kalman Shan MYA TT, JENNIFER Weeks in Treatment: 4 Unable to Obtain Patient History due to Dementia Information Obtained From Patient Eyes Medical History: Negative for: Cataracts; Glaucoma; Optic Neuritis Ear/Nose/Mouth/Throat Medical History: Negative for: Chronic sinus problems/congestion; Middle ear problems Hematologic/Lymphatic Medical History: Negative for: Anemia; Hemophilia; Human Immunodeficiency Virus; Lymphedema; Sickle Cell Disease Respiratory Medical History: Negative for: Aspiration; Asthma; Chronic Obstructive Pulmonary Disease (COPD); Pneumothorax; Sleep Apnea; Tuberculosis Cardiovascular Medical History: Positive for: Hypertension; Myocardial Infarction - 2019 Negative for: Angina; Arrhythmia; Congestive Heart Failure; Coronary Artery Disease; Deep Vein Thrombosis; Hypotension; Peripheral Arterial Disease; Peripheral Venous Disease; Phlebitis; Vasculitis Gastrointestinal Medical History: Positive for: Crohns Negative for: Cirrhosis ; Colitis; Hepatitis A; Hepatitis B; Hepatitis C Past Medical History Notes: incontienent Endocrine Medical History: Negative for: Type I Diabetes; Type II Diabetes Genitourinary Medical History: Negative for: End Stage Renal Disease Past Medical History Notes: incontinent; CKD stage 4 Immunological Medical History: Negative for: Lupus Erythematosus; Raynauds; Scleroderma Integumentary (Skin) Medical History: Negative for: History of Burn Musculoskeletal Medical History: Negative for: Gout; Rheumatoid Arthritis; Osteoarthritis; Osteomyelitis Neurologic Medical History: Positive for: Dementia Negative for: Neuropathy; Quadriplegia; Paraplegia; Seizure Disorder Past Medical History Notes: hx  TIA; alzheimer's Oncologic Medical History: Negative  for: Received Chemotherapy; Received Radiation Psychiatric Medical History: Negative for: Anorexia/bulimia; Confinement Anxiety Immunizations Pneumococcal Vaccine: Received Pneumococcal Vaccination: No Implantable Devices None Hospitalization / Surgery History Type of Hospitalization/Surgery left heart cath and coronary angiography cardiac cath EGD colectomy dilation and curettage of uterus lap right hemicolectomy Family and Social History Unknown History: Yes; Never smoker; Marital Status - Married; Alcohol Use: Never; Drug Use: No History; Caffeine Use: Never; Financial Concerns: No; Food, Clothing or Shelter Needs: No; Support System Lacking: No; Transportation Concerns: No Electronic Signature(s) Signed: 03/23/2021 4:32:37 PM By: Kalman Shan DO Signed: 03/23/2021 6:02:04 PM By: Deon Pilling Entered By: Kalman Shan on 03/23/2021 15:41:10 -------------------------------------------------------------------------------- SuperBill Details Patient Name: Date of Service: [name], Rhonda Rhonda G. 03/23/2021 Medical Record Number: [id number] Patient Account Number: [id number] Date of Birth/Sex: Treating RN: 10/14/1935 (86 y.o. [name] Primary Care Provider: Su Monks Other Clinician: Referring Provider: Treating Provider/Extender: Kalman Shan MYA TT, JENNIFER Weeks in Treatment: 4 Diagnosis Coding ICD-10 Codes Code Description [phone] Pressure ulcer of left hip, stage 3 G30.9 Alzheimer's disease, unspecified I10 Essential (primary) hypertension N18.9 Chronic kidney disease, unspecified Facility Procedures CPT4 Code: [id number] Description: 90931 - DEB SUBQ TISSUE 20 SQ CM/< ICD-10 Diagnosis Description L89.223 Pressure ulcer of left hip, stage 3 Modifier: Quantity: 1 Physician Procedures : CPT4 Code Description Modifier [id number] - WC PHYS SUBQ TISS 20 SQ CM ICD-10 Diagnosis Description L89.223 Pressure ulcer of left hip, stage 3 Quantity:  1 Electronic Signature(s) Signed: 03/23/2021 4:32:37 PM By: Kalman Shan DO Entered By: Kalman Shan on 03/23/2021 16:07:57

## 2021-03-24 NOTE — Progress Notes (Signed)
SWAY, GUTTIERREZ ([id number]) Visit Report for 03/23/2021 Arrival Information Details Patient Name: Date of Service: [name], [place] [name] 03/23/2021 2:15 PM Medical Record Number: [id number] Patient Account Number: [id number] Date of Birth/Sex: Treating RN: 06/25/1935 (86 y.o. [name] Primary Care Donyea Gafford: Su Monks Other Clinician: Referring Azrael Maddix: Treating Guyla Bless/Extender: Kalman Shan MYA TT, JENNIFER Weeks in Treatment: 4 Visit Information History Since Last Visit Added or deleted any medications: No Patient Arrived: Wheel Chair Any new allergies or adverse reactions: No Arrival Time: 14:29 Had a fall or experienced change in No Accompanied By: son activities of daily living that may affect Transfer Assistance: Manual risk of falls: Patient Identification Verified: Yes Signs or symptoms of abuse/neglect since last visito No Secondary Verification Process Completed: Yes Hospitalized since last visit: No Patient Requires Transmission-Based Precautions: No Implantable device outside of the clinic excluding No Patient Has Alerts: No cellular tissue based products placed in the center since last visit: Has Dressing in Place as Prescribed: Yes Pain Present Now: No Electronic Signature(s) Signed: 03/23/2021 5:42:55 PM By: Lorrin Jackson Entered By: Lorrin Jackson on 03/23/2021 14:29:58 -------------------------------------------------------------------------------- Lower Extremity Assessment Details Patient Name: Date of Service: [name], [name] 03/23/2021 2:15 PM Medical Record Number: [id number] Patient Account Number: [id number] Date of Birth/Sex: Treating RN: 06/29/1935 (86 y.o. [name] Primary Care Lovett Coffin: Su Monks Other Clinician: Referring Clovis Mankins: Treating Makya Yurko/Extender: Kalman Shan MYA TT, JENNIFER Weeks in Treatment: 4 Electronic Signature(s) Signed: 03/23/2021 5:42:55 PM By: Lorrin Jackson Entered By: Lorrin Jackson on 03/23/2021 14:30:35 -------------------------------------------------------------------------------- Multi Wound Chart Details Patient Name: Date of Service: [name], [name] 03/23/2021 2:15 PM Medical Record Number: [id number] Patient Account Number: [id number] Date of Birth/Sex: Treating RN: 06-08-1935 (85 y.o. Debby Bud Primary Care Elige Shouse: Su Monks Other Clinician: Referring Farris Geiman: Treating Nikolai Wilczak/Extender: Kalman Shan MYA TT, JENNIFER Weeks in Treatment: 4 Vital Signs Height(in): 63 Pulse(bpm): 77 Weight(lbs): 80 Blood Pressure(mmHg): 115/73 Body Mass Index(BMI): 14 Temperature(F): 97.8 Respiratory Rate(breaths/min): 18 Photos: [1:No Photos Left Trochanter] [N/A:N/A N/A] Wound Location: [1:Gradually Appeared] [N/A:N/A] Wounding Event: [1:Pressure Ulcer] [N/A:N/A] Primary Etiology: [1:Hypertension, Myocardial Infarction, N/A] Comorbid History: [1:Crohns, Dementia 02/05/2021] [N/A:N/A] Date Acquired: [1:4] [N/A:N/A] Weeks of Treatment: [1:Open] [N/A:N/A] Wound Status: [1:1.5x1.2x1.9] [N/A:N/A] Measurements L x W x D (cm) [1:1.414] [N/A:N/A] A (cm) : rea [1:2.686] [N/A:N/A] Volume (cm) : [1:88.00%] [N/A:N/A] % Reduction in A [1:rea: 88.60%] [N/A:N/A] % Reduction in Volume: [1:9] Position 1 (o'clock): [1:2.5] Maximum Distance 1 (cm): [1:Yes] [N/A:N/A] Tunneling: [1:Category/Stage III] [N/A:N/A] Classification: [1:Large] [N/A:N/A] Exudate A mount: [1:Serosanguineous] [N/A:N/A] Exudate Type: [1:red, brown] [N/A:N/A] Exudate Color: [1:Well defined, not attached] [N/A:N/A] Wound Margin: [1:Large (67-100%)] [N/A:N/A] Granulation A mount: [1:Red, Pink] [N/A:N/A] Granulation Quality: [1:Small (1-33%)] [N/A:N/A] Necrotic A mount: [1:Fat Layer (Subcutaneous Tissue): Yes N/A] Exposed Structures: [1:Fascia: No Tendon: No Muscle: No Joint: No Bone: No Small (1-33%)] [N/A:N/A] Epithelialization: [1:Debridement - Excisional]  [N/A:N/A] Debridement: Pre-procedure Verification/Time Out 14:45 [N/A:N/A] Taken: [1:Lidocaine 4% Topical Solution] [N/A:N/A] Pain Control: [1:Subcutaneous, Slough] [N/A:N/A] Tissue Debrided: [1:Skin/Subcutaneous Tissue] [N/A:N/A] Level: [1:1.8] [N/A:N/A] Debridement A (sq cm): [1:rea Curette, Forceps, Scissors] [N/A:N/A] Instrument: [1:Minimum] [N/A:N/A] Bleeding: [1:Pressure] [N/A:N/A] Hemostasis A chieved: [1:0] [N/A:N/A] Procedural Pain: [1:0] [N/A:N/A] Post Procedural Pain: [1:Procedure was tolerated well] [N/A:N/A] Debridement Treatment Response: [1:1.5x1.2x1.9] [N/A:N/A] Post Debridement Measurements L x W x D (cm) [1:2.686] [N/A:N/A] Post Debridement Volume: (cm) [1:Category/Stage III] [N/A:N/A] Post Debridement Stage: [1:Debridement] [N/A:N/A] Treatment Notes Electronic Signature(s) Signed: 03/23/2021 4:32:37 PM By: Kalman Shan DO Signed: 03/23/2021 6:02:04 PM  By: Deon Pilling Entered By: Kalman Shan on 03/23/2021 15:39:33 -------------------------------------------------------------------------------- Multi-Disciplinary Care Plan Details Patient Name: Date of Service: [name], [name] 03/23/2021 2:15 PM Medical Record Number: [id number] Patient Account Number: [id number] Date of Birth/Sex: Treating RN: 25-Apr-1935 (86 y.o. Debby Bud Primary Care Vince Ainsley: Su Monks Other Clinician: Referring Norlene Lanes: Treating Deysha Cartier/Extender: Kalman Shan MYA TT, JENNIFER Weeks in Treatment: 4 Multidisciplinary Care Plan reviewed with physician Active Inactive Abuse / Safety / Falls / Self Care Management Nursing Diagnoses: Potential for falls Potential for injury related to falls Goals: Patient/caregiver will verbalize/demonstrate measures taken to improve the patient's personal safety Date Initiated: 02/20/2021 Target Resolution Date: 04/14/2021 Goal Status: Active Patient/caregiver will verbalize/demonstrate measures taken to prevent injury and/or  falls Date Initiated: 02/20/2021 Date Inactivated: 03/23/2021 Target Resolution Date: 03/24/2021 Goal Status: Met Interventions: Assess Activities of Daily Living upon admission and as needed Assess fall risk on admission and as needed Assess: immobility, friction, shearing, incontinence upon admission and as needed Assess impairment of mobility on admission and as needed per policy Assess personal safety and home safety (as indicated) on admission and as needed Assess self care needs on admission and as needed Provide education on fall prevention Provide education on personal and home safety Notes: Nutrition Nursing Diagnoses: Potential for alteratiion in Nutrition/Potential for imbalanced nutrition Goals: Patient/caregiver agrees to and verbalizes understanding of need to use nutritional supplements and/or vitamins as prescribed Date Initiated: 02/20/2021 Target Resolution Date: 04/14/2021 Goal Status: Active Interventions: Assess patient nutrition upon admission and as needed per policy Provide education on nutrition Treatment Activities: Education provided on Nutrition : 03/02/2021 Notes: Pressure Nursing Diagnoses: Knowledge deficit related to causes and risk factors for pressure ulcer development Knowledge deficit related to management of pressures ulcers Potential for impaired tissue integrity related to pressure, friction, moisture, and shear Goals: Patient/caregiver will verbalize risk factors for pressure ulcer development Date Initiated: 02/20/2021 Target Resolution Date: 04/14/2021 Goal Status: Active Patient/caregiver will verbalize understanding of pressure ulcer management Date Initiated: 02/20/2021 Date Inactivated: 03/23/2021 Target Resolution Date: 03/24/2021 Goal Status: Met Interventions: Assess: immobility, friction, shearing, incontinence upon admission and as needed Assess offloading mechanisms upon admission and as needed Assess potential for pressure ulcer upon  admission and as needed Provide education on pressure ulcers Notes: Wound/Skin Impairment Nursing Diagnoses: Impaired tissue integrity Knowledge deficit related to ulceration/compromised skin integrity Goals: Patient/caregiver will verbalize understanding of skin care regimen Date Initiated: 02/20/2021 Target Resolution Date: 04/14/2021 Goal Status: Active Ulcer/skin breakdown will have a volume reduction of 30% by week 4 Date Initiated: 02/20/2021 Date Inactivated: 03/23/2021 Target Resolution Date: 03/24/2021 Unmet Reason: deeper according to Goal Status: Unmet wound care measurements. Interventions: Assess patient/caregiver ability to obtain necessary supplies Assess patient/caregiver ability to perform ulcer/skin care regimen upon admission and as needed Assess ulceration(s) every visit Provide education on ulcer and skin care Notes: Electronic Signature(s) Signed: 03/23/2021 6:02:04 PM By: Deon Pilling Entered By: Deon Pilling on 03/23/2021 14:50:01 -------------------------------------------------------------------------------- Pain Assessment Details Patient Name: Date of Service: [name], [name] 03/23/2021 2:15 PM Medical Record Number: [id number] Patient Account Number: [id number] Date of Birth/Sex: Treating RN: 12/11/1934 (86 y.o. [name] Primary Care Margarett Viti: Su Monks Other Clinician: Referring Juri Dinning: Treating Lemon Sternberg/Extender: Kalman Shan MYA TT, JENNIFER Weeks in Treatment: 4 Active Problems Location of Pain Severity and Description of Pain Patient Has Paino Yes Site Locations Pain Location: Pain in Ulcers With Dressing Change: Yes Duration of the Pain. Constant / Intermittento Intermittent Pain Management and Medication  Current Pain Management: Notes patient states " ouch" but d/t mental status unable to give number Electronic Signature(s) Signed: 03/23/2021 5:42:55 PM By: Lorrin Jackson Entered By: Lorrin Jackson on 03/23/2021  14:32:56 -------------------------------------------------------------------------------- Patient/Caregiver Education Details Patient Name: Date of Service: MA Shelbie Proctor DIE Darnell Level 6/9/2022andnbsp2:15 PM Medical Record Number: [id number] Patient Account Number: [id number] Date of Birth/Gender: Treating RN: 02-27-1935 (86 y.o. Debby Bud Primary Care Physician: Su Monks Other Clinician: Referring Physician: Treating Physician/Extender: Kalman Shan MYA TT, JENNIFER Weeks in Treatment: 4 Education Assessment Education Provided To: Patient Education Topics Provided Nutrition: Handouts: Nutrition Methods: Explain/Verbal Responses: Reinforcements needed Electronic Signature(s) Signed: 03/23/2021 6:02:04 PM By: Deon Pilling Entered By: Deon Pilling on 03/23/2021 14:50:20 -------------------------------------------------------------------------------- Wound Assessment Details Patient Name: Date of Service: [name], [name] 03/23/2021 2:15 PM Medical Record Number: [id number] Patient Account Number: [id number] Date of Birth/Sex: Treating RN: 1935-05-17 (85 y.o. [name] Primary Care Elleen Coulibaly: Su Monks Other Clinician: Referring Bryten Maher: Treating Riese Hellard/Extender: Kalman Shan MYA TT, JENNIFER Weeks in Treatment: 4 Wound Status Wound Number: 1 Primary Etiology: Pressure Ulcer Wound Location: Left Trochanter Wound Status: Open Wounding Event: Gradually Appeared Comorbid History: Hypertension, Myocardial Infarction, Crohns, Dementia Date Acquired: 02/05/2021 Weeks Of Treatment: 4 Clustered Wound: No Photos Wound Measurements Length: (cm) 1.5 Width: (cm) 1.2 Depth: (cm) 1.9 Area: (cm) 1.414 Volume: (cm) 2.686 % Reduction in Area: 88% % Reduction in Volume: 88.6% Epithelialization: Small (1-33%) Tunneling: Yes Position (o'clock): 9 Maximum Distance: (cm) 2.5 Undermining: No Wound Description Classification: Category/Stage  III Wound Margin: Well defined, not attached Exudate Amount: Large Exudate Type: Serosanguineous Exudate Color: red, brown Foul Odor After Cleansing: No Slough/Fibrino Yes Wound Bed Granulation Amount: Large (67-100%) Exposed Structure Granulation Quality: Red, Pink Fascia Exposed: No Necrotic Amount: Small (1-33%) Fat Layer (Subcutaneous Tissue) Exposed: Yes Necrotic Quality: Adherent Slough Tendon Exposed: No Muscle Exposed: No Joint Exposed: No Bone Exposed: No Electronic Signature(s) Signed: 03/23/2021 5:42:55 PM By: Lorrin Jackson Signed: 03/24/2021 3:50:24 PM By: Sandre Kitty Entered By: Sandre Kitty on 03/23/2021 16:56:22 -------------------------------------------------------------------------------- Vitals Details Patient Name: Date of Service: [name], [name] 03/23/2021 2:15 PM Medical Record Number: [id number] Patient Account Number: [id number] Date of Birth/Sex: Treating RN: Jun 11, 1935 (86 y.o. [name] Primary Care Audriella Blakeley: Su Monks Other Clinician: Referring Titilayo Hagans: Treating Trany Chernick/Extender: Kalman Shan MYA TT, JENNIFER Weeks in Treatment: 4 Vital Signs Time Taken: 14:30 Temperature (F): 97.8 Height (in): 63 Pulse (bpm): 77 Weight (lbs): 80 Respiratory Rate (breaths/min): 18 Body Mass Index (BMI): 14.2 Blood Pressure (mmHg): 115/73 Reference Range: 80 - 120 mg / dl Electronic Signature(s) Signed: 03/23/2021 5:42:55 PM By: Lorrin Jackson Entered By: Lorrin Jackson on 03/23/2021 14:30:23

## 2021-04-06 ENCOUNTER — Other Ambulatory Visit: Payer: Self-pay

## 2021-04-06 ENCOUNTER — Encounter (HOSPITAL_BASED_OUTPATIENT_CLINIC_OR_DEPARTMENT_OTHER): Payer: Medicare Other | Admitting: Internal Medicine

## 2021-04-06 DIAGNOSIS — L89223 Pressure ulcer of left hip, stage 3: Secondary | ICD-10-CM | POA: Diagnosis not present

## 2021-04-06 DIAGNOSIS — I10 Essential (primary) hypertension: Secondary | ICD-10-CM | POA: Diagnosis not present

## 2021-04-06 DIAGNOSIS — N189 Chronic kidney disease, unspecified: Secondary | ICD-10-CM

## 2021-04-06 DIAGNOSIS — G309 Alzheimer's disease, unspecified: Secondary | ICD-10-CM | POA: Diagnosis not present

## 2021-04-07 NOTE — Progress Notes (Addendum)
[name], [name] ([id number]) Visit Report for 04/06/2021 Chief Complaint Document Details Patient Name: Date of Service: [name],  DIE Murillo. 04/06/2021 2:15 PM Medical Record Number: [id number] Patient Account Number: [id number] Date of Birth/Sex: Treating RN: 05-08-1935 (86 y.o. [name] Primary Care Provider: Su Monks Other Clinician: Referring Provider: Treating Provider/Extender: Kalman Shan MYA TT, JENNIFER Weeks in Treatment: 6 Information Obtained from: Patient Chief Complaint Left hip wound Electronic Signature(s) Signed: 04/06/2021 4:31:21 PM By: Kalman Shan DO Entered By: Kalman Shan on 04/06/2021 15:14:33 -------------------------------------------------------------------------------- HPI Details Patient Name: Date of Service: [name], [name]. 04/06/2021 2:15 PM Medical Record Number: [id number] Patient Account Number: [id number] Date of Birth/Sex: Treating RN: Jul 01, 1935 (85 y.o. [name] Primary Care Provider: Su Monks Other Clinician: Referring Provider: Treating Provider/Extender: Kalman Shan MYA TT, JENNIFER Weeks in Treatment: 6 History of Present Illness HPI Description: Admission 5/9 [name] is an 86 year old female with a past medical history of hypertension, hyperlipidemia, CAD s/p MI, and Alzheimer's Disease that presents to the clinic today for A left hip wound. The son is present and helps provide the history. This was noticed about 2 weeks ago and there was a scab to the area. When this was removed there was depth to the wound. It has gotten slightly larger over the past 3 days. Patient denies any fever/chills, nausea/vomiting and states she has been her usual state of health. Per son she has ample at home care . She has an aide that comes in 5 days a week. She is able to walk with support for short distances. This is limited due to her advanced dementia. She usually stays in a chair for most of  the day and then transfers to a bed at night. 5/19; patient presents for 1 week follow-up. Son was present and helped provide the history. She has been taking Augmentin without any issues. They have been applying Santyl daily to the wound. No complaints today. 6/2; patient presents for 1 week follow-up. Son is present and helps provide the history. She has been using Dakin's wet-to-dry. She states she feels well and denies any signs of infection. 6/9; patient presents for 1 week follow-up. Son is present and helps provide the history. He continues to do Dakin's wet-to-dry. Overall patient feels well and denies signs of infection. She is using her airflow mattress for about 12 hours a day. 6/23; patient presents for 2-week follow-up. Son is present. He has been using Dakin's wet-to-dry and has noticed improvement in the wound. There are no issues or complaints today. She denies signs of infection. Electronic Signature(s) Signed: 04/06/2021 4:31:21 PM By: Kalman Shan DO Entered By: Kalman Shan on 04/06/2021 15:15:16 -------------------------------------------------------------------------------- Physical Exam Details Patient Name: Date of Service: [name], [name]. 04/06/2021 2:15 PM Medical Record Number: [id number] Patient Account Number: [id number] Date of Birth/Sex: Treating RN: 25-Apr-1935 (86 y.o. [name] Primary Care Provider: Su Monks Other Clinician: Referring Provider: Treating Provider/Extender: Kalman Shan MYA TT, JENNIFER Weeks in Treatment: 6 Constitutional respirations regular, non-labored and within target range for patient.Marland Kitchen Psychiatric pleasant and cooperative. Notes Hip: There is an open wound with granulation tissue present and significant undermining circumferentially. No sloughing noted. No signs of infection. No odor noted. Electronic Signature(s) Signed: 04/06/2021 4:31:21 PM By: Kalman Shan DO Entered By: Kalman Shan on  04/06/2021 15:16:19 -------------------------------------------------------------------------------- Physician Orders Details Patient Name: Date of Service: [name], [name]. 04/06/2021 2:15 PM Medical Record Number: [id number]  Patient Account Number: [id number] Date of Birth/Sex: Treating RN: 1935/09/06 (86 y.o. [name] Primary Care Provider: Su Monks Other Clinician: Referring Provider: Treating Provider/Extender: Kalman Shan MYA TT, JENNIFER Weeks in Treatment: 6 Verbal / Phone Orders: No Diagnosis Coding ICD-10 Coding Code Description [phone] Pressure ulcer of left hip, stage 3 G30.9 Alzheimer's disease, unspecified I10 Essential (primary) hypertension N18.9 Chronic kidney disease, unspecified Follow-up Appointments Return Appointment in 1 week. Bathing/ Shower/ Hygiene May shower with protection but do not get wound dressing(s) wet. Off-Loading Gel mattress overlay (Group 1) Roho cushion for wheelchair Turn and reposition every 2 hours [place] wound care orders this week; continue Home Health for wound care. May utilize formulary equivalent dressing for wound treatment orders unless otherwise specified. Alvis Lemmings WE are changing the dressing to calcium alginate ag. Use 2 pieces of alginate AG and cut them so that the undermining is packed and be sure you are leaving two "tails" of the alginate ag out of the wound bed. Wound Treatment Wound #1 - Trochanter Wound Laterality: Left Cleanser: Soap and Water Every Other Day/15 Days Discharge Instructions: May shower and wash wound with dial antibacterial soap and water prior to dressing change. Cleanser: Wound Cleanser (Home Health) (Generic) Every Other Day/15 Days Discharge Instructions: Cleanse the wound with wound cleanser prior to applying a clean dressing using gauze sponges, not tissue or cotton balls. Prim Dressing: KerraCel Ag Gelling Fiber Dressing, 4x5 in (silver alginate) (Home  Health) Every Other Day/15 Days ary Discharge Instructions: Apply silver alginate to wound bed as instructed Secondary Dressing: Bordered Gauze, 4x4 in [facility]) Every Other Day/15 Days Discharge Instructions: Apply over primary dressing as directed. Electronic Signature(s) Signed: 04/06/2021 5:20:14 PM By: Kalman Shan DO Signed: 04/07/2021 6:18:32 PM By: Rhae Hammock RN Previous Signature: 04/06/2021 4:31:21 PM Version By: Kalman Shan DO Entered By: Rhae Hammock on 04/06/2021 17:05:51 -------------------------------------------------------------------------------- Problem List Details Patient Name: Date of Service: [name], [name]. 04/06/2021 2:15 PM Medical Record Number: [id number] Patient Account Number: [id number] Date of Birth/Sex: Treating RN: Mar 31, 1935 (86 y.o. [name] Primary Care Provider: Su Monks Other Clinician: Referring Provider: Treating Provider/Extender: Kalman Shan MYA TT, JENNIFER Weeks in Treatment: 6 Active Problems ICD-10 Encounter Code Description Active Date MDM Diagnosis L89.223 Pressure ulcer of left hip, stage 3 02/20/2021 No Yes G30.9 Alzheimer's disease, unspecified 02/20/2021 No Yes I10 Essential (primary) hypertension 02/20/2021 No Yes N18.9 Chronic kidney disease, unspecified 02/20/2021 No Yes Inactive Problems Resolved Problems Electronic Signature(s) Signed: 04/06/2021 4:31:21 PM By: Kalman Shan DO Entered By: Kalman Shan on 04/06/2021 15:14:08 -------------------------------------------------------------------------------- Progress Note Details Patient Name: Date of Service: [name], [name]. 04/06/2021 2:15 PM Medical Record Number: [id number] Patient Account Number: [id number] Date of Birth/Sex: Treating RN: [birth year] (86 y.o. [name] Primary Care Provider: Su Monks Other Clinician: Referring Provider: Treating Provider/Extender: Kalman Shan MYA TT,  JENNIFER Weeks in Treatment: 6 Subjective Chief Complaint Information obtained from Patient Left hip wound History of Present Illness (HPI) Admission 5/9 Ms. Weide is an 86 year old female with a past medical history of hypertension, hyperlipidemia, CAD s/p MI, and Alzheimer's Disease that presents to the clinic today for A left hip wound. The son is present and helps provide the history. This was noticed about 2 weeks ago and there was a scab to the area. When this was removed there was depth to the wound. It has gotten slightly larger over the past 3 days. Patient denies any  fever/chills, nausea/vomiting and states she has been her usual state of health. Per son she has ample at home care . She has an aide that comes in 5 days a week. She is able to walk with support for short distances. This is limited due to her advanced dementia. She usually stays in a chair for most of the day and then transfers to a bed at night. 5/19; patient presents for 1 week follow-up. Son was present and helped provide the history. She has been taking Augmentin without any issues. They have been applying Santyl daily to the wound. No complaints today. 6/2; patient presents for 1 week follow-up. Son is present and helps provide the history. She has been using Dakin's wet-to-dry. She states she feels well and denies any signs of infection. 6/9; patient presents for 1 week follow-up. Son is present and helps provide the history. He continues to do Dakin's wet-to-dry. Overall patient feels well and denies signs of infection. She is using her airflow mattress for about 12 hours a day. 6/23; patient presents for 2-week follow-up. Son is present. He has been using Dakin's wet-to-dry and has noticed improvement in the wound. There are no issues or complaints today. She denies signs of infection. Patient History Unable to Obtain Patient History due to Dementia. Information obtained from Patient. Family History Unknown  History. Social History Never smoker, Marital Status - Married, Alcohol Use - Never, Drug Use - No History, Caffeine Use - Never. Medical History Eyes Denies history of Cataracts, Glaucoma, Optic Neuritis Ear/Nose/Mouth/Throat Denies history of Chronic sinus problems/congestion, Middle ear problems Hematologic/Lymphatic Denies history of Anemia, Hemophilia, Human Immunodeficiency Virus, Lymphedema, Sickle Cell Disease Respiratory Denies history of Aspiration, Asthma, Chronic Obstructive Pulmonary Disease (COPD), Pneumothorax, Sleep Apnea, Tuberculosis Cardiovascular Patient has history of Hypertension, Myocardial Infarction - 2019 Denies history of Angina, Arrhythmia, Congestive Heart Failure, Coronary Artery Disease, Deep Vein Thrombosis, Hypotension, Peripheral Arterial Disease, Peripheral Venous Disease, Phlebitis, Vasculitis Gastrointestinal Patient has history of Crohnoos Denies history of Cirrhosis , Colitis, Hepatitis A, Hepatitis B, Hepatitis C Endocrine Denies history of Type I Diabetes, Type II Diabetes Genitourinary Denies history of End Stage Renal Disease Immunological Denies history of Lupus Erythematosus, Raynaudoos, Scleroderma Integumentary (Skin) Denies history of History of Burn Musculoskeletal Denies history of Gout, Rheumatoid Arthritis, Osteoarthritis, Osteomyelitis Neurologic Patient has history of Dementia Denies history of Neuropathy, Quadriplegia, Paraplegia, Seizure Disorder Oncologic Denies history of Received Chemotherapy, Received Radiation Psychiatric Denies history of Anorexia/bulimia, Confinement Anxiety Hospitalization/Surgery History - left heart cath and coronary angiography. - cardiac cath. - EGD. - colectomy. - dilation and curettage of uterus. - lap right hemicolectomy. Medical A Surgical History Notes nd Gastrointestinal incontienent Genitourinary incontinent; CKD stage 4 Neurologic hx TIA;  alzheimer's Objective Constitutional respirations regular, non-labored and within target range for patient.. Vitals Time Taken: 2:54 PM, Height: 63 in, Weight: 80 lbs, BMI: 14.2, Temperature: 97.5 F, Pulse: 108 bpm, Respiratory Rate: 18 breaths/min, Blood Pressure: 112/64 mmHg. Psychiatric pleasant and cooperative. General Notes: Hip: There is an open wound with granulation tissue present and significant undermining circumferentially. No sloughing noted. No signs of infection. No odor noted. Integumentary (Hair, Skin) Wound #1 status is Open. Original cause of wound was Gradually Appeared. The date acquired was: 02/05/2021. The wound has been in treatment 6 weeks. The wound is located on the Left Trochanter. The wound measures 1cm length x 0.7cm width x 1.1cm depth; 0.55cm^2 area and 0.605cm^3 volume. There is Fat Layer (Subcutaneous Tissue) exposed. There is no tunneling noted, however,  there is undermining starting at 1:00 and ending at 5:00 with a maximum distance of 1.7cm. There is a large amount of serosanguineous drainage noted. The wound margin is well defined and not attached to the wound base. There is large (67- 100%) red, pink granulation within the wound bed. There is no necrotic tissue within the wound bed. Assessment Active Problems ICD-10 Pressure ulcer of left hip, stage 3 Alzheimer's disease, unspecified Essential (primary) hypertension Chronic kidney disease, unspecified Overall patient's wound looks much improved since last clinic visit. An x-ray was ordered and was completed by home health. It did not show any osseous abnormality. Although I cannot quite see completely the tissue where there is undermining there is no necrotic tissue like I noticed before on inspection. There is excellent granulation tissue at the opening. There is no odor anymore. I think at this time we can switch over to a dressing and we will try silver alginate. This will need to be packed in. If  she does not do great with this we can either go back to Dakin's wet to dry or iodoform packing. The hole is actually getting smaller to the opening and I do not want it to close with such significant undermining still left. Plan 1. Silver alginate 2. Follow-up in 1 week Electronic Signature(s) Signed: 04/06/2021 4:31:21 PM By: Kalman Shan DO Entered By: Kalman Shan on 04/06/2021 15:20:13 -------------------------------------------------------------------------------- HxROS Details Patient Name: Date of Service: [name], [name]. 04/06/2021 2:15 PM Medical Record Number: [id number] Patient Account Number: [id number] Date of Birth/Sex: Treating RN: Jun 03, 1935 (86 y.o. [name] Primary Care Provider: Su Monks Other Clinician: Referring Provider: Treating Provider/Extender: Kalman Shan MYA TT, JENNIFER Weeks in Treatment: 6 Unable to Obtain Patient History due to Dementia Information Obtained From Patient Eyes Medical History: Negative for: Cataracts; Glaucoma; Optic Neuritis Ear/Nose/Mouth/Throat Medical History: Negative for: Chronic sinus problems/congestion; Middle ear problems Hematologic/Lymphatic Medical History: Negative for: Anemia; Hemophilia; Human Immunodeficiency Virus; Lymphedema; Sickle Cell Disease Respiratory Medical History: Negative for: Aspiration; Asthma; Chronic Obstructive Pulmonary Disease (COPD); Pneumothorax; Sleep Apnea; Tuberculosis Cardiovascular Medical History: Positive for: Hypertension; Myocardial Infarction - 2019 Negative for: Angina; Arrhythmia; Congestive Heart Failure; Coronary Artery Disease; Deep Vein Thrombosis; Hypotension; Peripheral Arterial Disease; Peripheral Venous Disease; Phlebitis; Vasculitis Gastrointestinal Medical History: Positive for: Crohns Negative for: Cirrhosis ; Colitis; Hepatitis A; Hepatitis B; Hepatitis C Past Medical History Notes: incontienent Endocrine Medical  History: Negative for: Type I Diabetes; Type II Diabetes Genitourinary Medical History: Negative for: End Stage Renal Disease Past Medical History Notes: incontinent; CKD stage 4 Immunological Medical History: Negative for: Lupus Erythematosus; Raynauds; Scleroderma Integumentary (Skin) Medical History: Negative for: History of Burn Musculoskeletal Medical History: Negative for: Gout; Rheumatoid Arthritis; Osteoarthritis; Osteomyelitis Neurologic Medical History: Positive for: Dementia Negative for: Neuropathy; Quadriplegia; Paraplegia; Seizure Disorder Past Medical History Notes: hx TIA; alzheimer's Oncologic Medical History: Negative for: Received Chemotherapy; Received Radiation Psychiatric Medical History: Negative for: Anorexia/bulimia; Confinement Anxiety Immunizations Pneumococcal Vaccine: Received Pneumococcal Vaccination: No Implantable Devices None Hospitalization / Surgery History Type of Hospitalization/Surgery left heart cath and coronary angiography cardiac cath EGD colectomy dilation and curettage of uterus lap right hemicolectomy Family and Social History Unknown History: Yes; Never smoker; Marital Status - Married; Alcohol Use: Never; Drug Use: No History; Caffeine Use: Never; Financial Concerns: No; Food, Clothing or Shelter Needs: No; Support System Lacking: No; Transportation Concerns: No Electronic Signature(s) Signed: 04/06/2021 4:31:21 PM By: Kalman Shan DO Signed: 04/07/2021 6:18:32 PM By: Rhae Hammock RN Entered By: [place]ber [place],  [place] on 04/06/2021 15:15:27 -------------------------------------------------------------------------------- SuperBill Details Patient Name: Date of Service: [name], [place] DIE Murillo. 04/06/2021 Medical Record Number: [id number] Patient Account Number: [id number] Date of Birth/Sex: Treating RN: Feb 12, 1935 (86 y.o. [name] Primary Care Provider: Su Monks Other Clinician: Referring  Provider: Treating Provider/Extender: Kalman Shan MYA TT, JENNIFER Weeks in Treatment: 6 Diagnosis Coding ICD-10 Codes Code Description [phone] Pressure ulcer of left hip, stage 3 G30.9 Alzheimer's disease, unspecified I10 Essential (primary) hypertension N18.9 Chronic kidney disease, unspecified Facility Procedures CPT4 Code: [id number] Description: 56943 - WOUND CARE VISIT-LEV 3 EST PT Modifier: Quantity: 1 Physician Procedures : CPT4 Code Description Modifier [id number] - WC PHYS LEVEL 3 - EST PT ICD-10 Diagnosis Description L89.223 Pressure ulcer of left hip, stage 3 G30.9 Alzheimer's disease, unspecified I10 Essential (primary) hypertension N18.9 Chronic kidney disease,  unspecified Quantity: 1 Electronic Signature(s) Signed: 04/06/2021 4:31:21 PM By: Kalman Shan DO Signed: 04/07/2021 6:18:32 PM By: Rhae Hammock RN Entered By: Rhae Hammock on 04/06/2021 15:33:25

## 2021-04-10 ENCOUNTER — Other Ambulatory Visit: Payer: Self-pay

## 2021-04-10 ENCOUNTER — Telehealth (INDEPENDENT_AMBULATORY_CARE_PROVIDER_SITE_OTHER): Payer: Medicare Other | Admitting: Neurology

## 2021-04-10 ENCOUNTER — Encounter: Payer: Self-pay | Admitting: Neurology

## 2021-04-10 ENCOUNTER — Telehealth: Payer: Self-pay | Admitting: Neurology

## 2021-04-10 VITALS — Wt 85.0 lb

## 2021-04-10 DIAGNOSIS — Z9119 Patient's noncompliance with other medical treatment and regimen: Secondary | ICD-10-CM

## 2021-04-10 NOTE — Progress Notes (Signed)
Visit rescheduled due to technical difficulties

## 2021-04-10 NOTE — Telephone Encounter (Signed)
Pt's son called in and wanted to make sure Dr. Delice Lesch is aware the patient is very sleepy from a previous visit today and may not be as active in the visit today.

## 2021-04-13 ENCOUNTER — Encounter (HOSPITAL_BASED_OUTPATIENT_CLINIC_OR_DEPARTMENT_OTHER): Payer: Medicare Other | Admitting: Internal Medicine

## 2021-04-17 NOTE — Progress Notes (Addendum)
Virtual Visit via Video Note The purpose of this virtual visit is to provide medical care while limiting exposure to the novel coronavirus.    Consent was obtained for video visit:  Yes.   Answered questions that patient had about telehealth interaction:  Yes.   I discussed the limitations, risks, security and privacy concerns of performing an evaluation and management service by telemedicine. I also discussed with the patient that there may be a patient responsible charge related to this service. The patient expressed understanding and agreed to proceed.  Pt location: Home Physician Location: office Name of referring provider:  Myatt, Marissa Calamity, MD I connected with [name] at patients initiation/request on 04/18/2021 at  3:00 PM EDT by video enabled telemedicine application and verified that I am speaking with the correct person using two identifiers. Pt MRN:  [id number] Pt DOB:  [birth year] Video Participants:  [name];  son, [name]  Assessment and Plan:   This is a pleasant 86 year old woman with a history of hypertension, hyperlipidemia, CAD status post MI, with moderate dementia likely due to Alzheimer's disease with behavioral changes, consisting of paranoia and hallucinations, well controlled with Abilify 5 mg daily, and Seroquel 12.5 mg nightly without side effects.  Patient has 24/7 care.  Continue supportive care, follow-up in 1 year, they know to call if they have any questions or concerns.  A refill for the medications has been sent to the pharmacy  Follow Up Instructions:               Follow up in 1 year                 -I discussed the assessment and treatment plan with the patient. The patient and her son was provided an opportunity to ask questions and all were answered. The patient and son agreed with the plan and demonstrated an understanding of the instructions.   The patient and son were advised to call back or seek an in-person evaluation if the  symptoms worsen or if the condition fails to improve as anticipated.   History of Present Illness:  This is a pleasant 86 year old woman with a history of dementia likely due to moderate dementia likely due to Alzheimer's disease with behavioral disturbance, last seen via virtual video visit on 04/12/2020.  She is seen today in a follow-up visit, and her son [name] is present during the visit to provide further information. He reports that during these year, for paranoia and hallucinations have been very well controlled with Abilify 5 mg daily and Seroquel 12.5 mg daily without side effects.  He reports that "she is holding on well ".  She sleeps well, without sleepwalking or nightmares. There were no hospitalizations during this period.  She had a left hip stage III pressure ulcer, treated by home health wound care center, and home health nursing, improved since April 2022.  When she is not somnolent around naptime, she tries to ambulate as tolerated with a walker.  She is able to stand with assistance "She is initially stiff, but does better when she gets moving ".  She has 24 7 care with her son, daughter-in-law and an aide that comes 5 days a week. Her appetite is good, there is no trouble swallowing.  She has lost about 7 to 10 pounds but it was related to diuresis.  She denies any further leg swelling since using Lasix 20 mg daily. She denies any headaches, dizziness, focal numbness or tingling,  or weakness.  She needs assistance with all ADLs, her family manages finances, medications, dressing and bathing.    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 She underwent Neuropsychological evaluation a few months ago, history will be briefly reviewed for our records. Cognitive and behavioral changes started around 3-4 years ago.  She would think someone was taking items when she could not find them. She started getting more agitated and paranoid, thinking her son from [place] was taking things from her house. Around 2 years ago, her son killed 2 snakes in her property and a small lizard got into the house. She got increasingly paranoid that there were multiple lizards in the house. She also started to think there were people outside her house and called the polices several times. She was seeing animals and people inside her house. At one point she thought the heating technician installed listening devices in her house. She was brought to the ER in 06/2017 for visual hallucinations and paranoid delusions. Bloodwork was unremarkable. I personally reviewed head CT without contrast which showed diffuse atrophy and chronic microvascular disease, remote left parieto-occipital infarct with encephalomalacia. She called EMS in 08/2017 and was brought to the ER for delusions of parasite infestation. She was IVC'd and admitted to geriatric behavioral health for 6 days where Abilify was started. She was back in the ER in 09/2017 when she did not recognize her husband or herself in the mirror, thinking there was a women trying to get into the bed with her (it was her husband). Abilify dose was increased and Seroquel started. Her family reports that these 2 medications have helped a lot. They do not want the word "psychosis" mentioned to the patient, and ask that they be calmed "calming medications." She was in the ER in 11/2017 for chest discomfort and weakness and diagnosed with Takatsubo cardiomyopathy. Seroquel and Abilify were stopped, however she was back a few days later for agitation and medications were restarted.   Her neuropsychological evaluation indicated Moderate dementia most likely secondary to Alzheimer's disease, with behavioral disturbance. Due to moderate stage, it is more difficult to ascertain etiology of dementia due to global  nature of impairment, however based on weaknesses on testing, Alzheimer's is most likely. Other etiologies such as LBD and FTD-bv were considered, but she does not have any signs of parkinsonism. There is no evidence of an underlying psychiatric disorder, delusions and hallucinations are considered neuropsychiatric features directly related to her moderate stage dementia.   She does not drive. Her sons help manage her and her husband's medications. Bills are on autopay. She is able to bathe and dress independently. She reports she is forgetful at times, forgetting where she put things. She has occasional dizziness with positional changes. She has occasional low back pain. Otherwise she denies any headaches, dizziness, diplopia, dysarthria/dysphagia, neck pain, focal numbness/tingling/weakness, bowel/bladder dysfunction, anosmia, or tremors.          Current Outpatient Medications on File Prior to Visit  Medication Sig Dispense Refill   ARIPiprazole (ABILIFY) 5 MG tablet TAKE 1 TABLET BY MOUTH EVERY DAY 60 tablet 0   atorvastatin (LIPITOR) 40 MG tablet Take 1 tablet (40 mg total) by mouth daily at 6 PM. (Patient taking differently: Take 20 mg by mouth daily at 6 PM.) 30 tablet 3   carvedilol (COREG) 6.25 MG tablet Take 6.25 mg  by mouth 2 (two) times daily with a meal.     clopidogrel (PLAVIX) 75 MG tablet Take 1 tablet (75 mg total) by mouth daily. 30 tablet 3   furosemide (LASIX) 20 MG tablet Take 40 mg by mouth daily.  3   hydroxypropyl methylcellulose (ISOPTO TEARS) 2.5 % ophthalmic solution Place 1 drop into both eyes 4 (four) times daily as needed for dry eyes. (Patient not taking: Reported on 04/10/2021)     Multiple Vitamins-Minerals (MULTIVITAMIN ADULTS) TABS Take 0.5 tablets by mouth daily.     nitroGLYCERIN (NITROSTAT) 0.4 MG SL tablet Place 1 tablet (0.4 mg total) under the tongue every 5 (five) minutes x 3 doses as needed for chest pain. 25 tablet 12   omeprazole (PRILOSEC) 20 MG capsule  Take 20 mg by mouth daily.      potassium chloride (K-DUR) 10 MEQ tablet Take 10 mEq by mouth daily.  3   QUEtiapine (SEROQUEL) 25 MG tablet TAKE HALF TABLET BY MOUTH AT BEDTIME 15 tablet 0   No current facility-administered medications on file prior to visit.     Observations/Objective:    GEN:  The patient appears stated age and is in NAD.  Neurological examination: Patient is awake, alert, oriented to person. No aphasia or dysarthria. Intact fluency, able to follow simple commands. Remote and recent memory impaired, a few minutes into the visit she needs to be reminded about the video visit and waves and says hello again. Cranial nerves: Extraocular movements intact with no nystagmus. No facial asymmetry. Motor: moves all extremities symmetrically, at least anti-gravity x 4. No incoordination on finger to nose testing. Gait: slow and cautious, hunched posture, uses cane.       Total time spent on today's visit was 30 minutes, including both face-to-face time and nonface-to-face time.  Time included that spent on review of records (prior notes available to me/labs/imaging if pertinent), discussing treatment and goals, answering patient's questions and coordinating care.   Sharene Butters, PA-C

## 2021-04-18 ENCOUNTER — Other Ambulatory Visit: Payer: Self-pay

## 2021-04-18 ENCOUNTER — Encounter: Payer: Self-pay | Admitting: Physician Assistant

## 2021-04-18 ENCOUNTER — Other Ambulatory Visit: Payer: Self-pay | Admitting: Physician Assistant

## 2021-04-18 ENCOUNTER — Telehealth (INDEPENDENT_AMBULATORY_CARE_PROVIDER_SITE_OTHER): Payer: Medicare Other | Admitting: Physician Assistant

## 2021-04-18 DIAGNOSIS — F0281 Dementia in other diseases classified elsewhere with behavioral disturbance: Secondary | ICD-10-CM | POA: Diagnosis not present

## 2021-04-18 DIAGNOSIS — G301 Alzheimer's disease with late onset: Secondary | ICD-10-CM | POA: Diagnosis not present

## 2021-04-18 MED ORDER — QUETIAPINE FUMARATE 25 MG PO TABS: 25.0000 mg | ORAL_TABLET | Freq: Every day | ORAL | 0 refills | Status: DC

## 2021-04-18 MED ORDER — ARIPIPRAZOLE 5 MG PO TABS: 5.0000 mg | ORAL_TABLET | Freq: Every day | ORAL | 0 refills | Status: DC

## 2021-04-18 NOTE — Patient Instructions (Addendum)
It was a pleasure to see you today at our office.   Recommendations:  Meds: Follow up in 1 year   Continue Abilify 5 mg daily and Seroquel12.5 mg at bedtime Call us if any   RECOMMENDATIONS FOR ALL PATIENTS WITH MEMORY PROBLEMS: 1. Continue to exercise (Recommend 30 minutes of walking everyday, or 3 hours every week) 2. Increase social interactions - continue going to [place] and enjoy social gatherings with friends and family 3. Eat healthy, avoid fried foods and eat more fruits and vegetables 4. Maintain adequate blood pressure, blood sugar, and blood cholesterol level. Reducing the risk of stroke and cardiovascular disease also helps promoting better memory. 5. Avoid stressful situations. Live a simple life and avoid aggravations. Organize your time and prepare for the next day in anticipation. 6. Sleep well, avoid any interruptions of sleep and avoid any distractions in the bedroom that may interfere with adequate sleep quality 7. Avoid sugar, avoid sweets as there is a strong link between excessive sugar intake, diabetes, and cognitive impairment We discussed the Mediterranean diet, which has been shown to help patients reduce the risk of progressive memory disorders and reduces cardiovascular risk. This includes eating fish, eat fruits and green leafy vegetables, nuts like almonds and hazelnuts, walnuts, and also use olive oil. Avoid fast foods and fried foods as much as possible. Avoid sweets and sugar as sugar use has been linked to worsening of memory function.  There is always a concern of gradual progression of memory problems. If this is the case, then we may need to adjust level of care according to patient needs. Support, both to the patient and caregiver, should then be put into place.        FALL PRECAUTIONS: Be cautious when walking. Scan the area for obstacles that may increase the risk of trips and falls. When getting up in the mornings, sit up at the edge of the bed for a  few minutes before getting out of bed. Consider elevating the bed at the head end to avoid drop of blood pressure when getting up. Walk always in a well-lit room (use night lights in the walls). Avoid area rugs or power cords from appliances in the middle of the walkways. Use a walker or a cane if necessary and consider physical therapy for balance exercise. Get your eyesight checked regularly.    HOME SAFETY: Consider the safety of the kitchen when operating appliances like stoves, microwave oven, and blender. Consider having supervision and share cooking responsibilities until no longer able to participate in those. Accidents with firearms and other hazards in the house should be identified and addressed as well.   ABILITY TO BE LEFT ALONE: If patient is unable to contact 911 operator, consider using [employer], or when the need is there, arrange for someone to stay with patients. Smoking is a fire hazard, consider supervision or cessation. Risk of wandering should be assessed by caregiver and if detected at any point, supervision and safe proof recommendations should be instituted.  MEDICATION SUPERVISION: Inability to self-administer medication needs to be constantly addressed. Implement a mechanism to ensure safe administration of the medications.

## 2021-04-21 ENCOUNTER — Encounter (HOSPITAL_BASED_OUTPATIENT_CLINIC_OR_DEPARTMENT_OTHER): Payer: Medicare Other | Attending: Internal Medicine | Admitting: Internal Medicine

## 2021-04-21 ENCOUNTER — Other Ambulatory Visit: Payer: Self-pay

## 2021-04-21 DIAGNOSIS — G309 Alzheimer's disease, unspecified: Secondary | ICD-10-CM

## 2021-04-21 DIAGNOSIS — I129 Hypertensive chronic kidney disease with stage 1 through stage 4 chronic kidney disease, or unspecified chronic kidney disease: Secondary | ICD-10-CM | POA: Insufficient documentation

## 2021-04-21 DIAGNOSIS — S91001A Unspecified open wound, right ankle, initial encounter: Secondary | ICD-10-CM | POA: Insufficient documentation

## 2021-04-21 DIAGNOSIS — S81802A Unspecified open wound, left lower leg, initial encounter: Secondary | ICD-10-CM | POA: Insufficient documentation

## 2021-04-21 DIAGNOSIS — I2510 Atherosclerotic heart disease of native coronary artery without angina pectoris: Secondary | ICD-10-CM | POA: Insufficient documentation

## 2021-04-21 DIAGNOSIS — N189 Chronic kidney disease, unspecified: Secondary | ICD-10-CM

## 2021-04-21 DIAGNOSIS — L89223 Pressure ulcer of left hip, stage 3: Secondary | ICD-10-CM | POA: Diagnosis not present

## 2021-04-21 DIAGNOSIS — X58XXXA Exposure to other specified factors, initial encounter: Secondary | ICD-10-CM | POA: Diagnosis not present

## 2021-04-21 DIAGNOSIS — I10 Essential (primary) hypertension: Secondary | ICD-10-CM

## 2021-04-21 DIAGNOSIS — I252 Old myocardial infarction: Secondary | ICD-10-CM | POA: Insufficient documentation

## 2021-04-25 NOTE — Progress Notes (Signed)
[name], [name] ([id number]) Visit Report for 04/21/2021 Arrival Information Details Patient Name: Date of Service: [name], [place] [name] 04/21/2021 2:15 PM Medical Record Number: [id number] Patient Account Number: [id number] Date of Birth/Sex: Treating RN: [birth year] (86 y.o. [name], [name] Primary Care Jastin Fore: Su Monks Other Clinician: Referring Mamoudou Mulvehill: Treating Xadrian Craighead/Extender: Kalman Shan MYA TT, JENNIFER Weeks in Treatment: 8 Visit Information History Since Last Visit Added or deleted any medications: No Patient Arrived: Wheel Chair Any new allergies or adverse reactions: No Arrival Time: 14:37 Had a fall or experienced change in No Accompanied By: son activities of daily living that may affect Transfer Assistance: Manual risk of falls: Patient Identification Verified: Yes Signs or symptoms of abuse/neglect since last visito No Secondary Verification Process Completed: Yes Hospitalized since last visit: No Patient Requires Transmission-Based Precautions: No Implantable device outside of the clinic excluding No Patient Has Alerts: No cellular tissue based products placed in the center since last visit: Has Dressing in Place as Prescribed: Yes Pain Present Now: No Electronic Signature(s) Signed: 04/21/2021 5:38:08 PM By: Rhae Hammock RN Entered By: Rhae Hammock on 04/21/2021 14:37:27 -------------------------------------------------------------------------------- Clinic Level of Care Assessment Details Patient Name: Date of Service: [name], [name] 04/21/2021 2:15 PM Medical Record Number: [id number] Patient Account Number: [id number] Date of Birth/Sex: Treating RN: [birth year] (86 y.o. [name] Primary Care Orman Matsumura: Su Monks Other Clinician: Referring Jerold Yoss: Treating Mychael Soots/Extender: Kalman Shan MYA TT, JENNIFER Weeks in Treatment: 8 Clinic Level of Care Assessment Items TOOL 4 Quantity Score X- 1 0 Use when  only an EandM is performed on FOLLOW-UP visit ASSESSMENTS - Nursing Assessment / Reassessment X- 1 10 Reassessment of Co-morbidities (includes updates in patient status) X- 1 5 Reassessment of Adherence to Treatment Plan ASSESSMENTS - Wound and Skin A ssessment / Reassessment _0  - 0 Simple Wound Assessment / Reassessment - one wound X- 3 5 Complex Wound Assessment / Reassessment - multiple wounds _1  - 0 Dermatologic / Skin Assessment (not related to wound area) ASSESSMENTS - Focused Assessment _2  - 0 Circumferential Edema Measurements - multi extremities _3  - 0 Nutritional Assessment / Counseling / Intervention X- 1 5 Lower Extremity Assessment (monofilament, tuning fork, pulses) _4  - 0 Peripheral Arterial Disease Assessment (using hand held doppler) ASSESSMENTS - Ostomy and/or Continence Assessment and Care _5  - 0 Incontinence Assessment and Management _6  - 0 Ostomy Care Assessment and Management (repouching, etc.) PROCESS - Coordination of Care X - Simple Patient / Family Education for ongoing care 1 15 _7  - 0 Complex (extensive) Patient / Family Education for ongoing care X- 1 10 Staff obtains Programmer, systems, Records, T Results / Process Orders est X- 1 10 Staff telephones HHA, Nursing Homes / Clarify orders / etc _8  - 0 Routine Transfer to another Facility (non-emergent condition) _9  - 0 Routine Hospital Admission (non-emergent condition) _10  - 0 New Admissions / Biomedical engineer / Ordering NPWT Apligraf, etc. , _11  - 0 Emergency Hospital Admission (emergent condition) X- 1 10 Simple Discharge Coordination _12  - 0 Complex (extensive) Discharge Coordination PROCESS - Special Needs _13  - 0 Pediatric / Minor Patient Management _14  - 0 Isolation Patient Management _15  - 0 Hearing / Language / Visual special needs _16  - 0 Assessment of Community assistance (transportation, D/C planning, etc.) _17  - 0 Additional assistance / Altered mentation _18  - 0 Support  Surface(s) Assessment (bed, cushion, seat, etc.) INTERVENTIONS - Wound Cleansing / Measurement _19  - 0 Simple Wound Cleansing - one wound X- 3 5 Complex Wound  Cleansing - multiple wounds X- 1 5 Wound Imaging (photographs - any number of wounds) _0  - 0 Wound Tracing (instead of photographs) _1  - 0 Simple Wound Measurement - one wound X- 3 5 Complex Wound Measurement - multiple wounds INTERVENTIONS - Wound Dressings X - Small Wound Dressing one or multiple wounds 3 10 _2  - 0 Medium Wound Dressing one or multiple wounds _3  - 0 Large Wound Dressing one or multiple wounds X- 1 5 Application of Medications - topical <[id number]>_6<\/WFUXNATFTDDUKGUR>_4  - 0 Application of Medications - injection INTERVENTIONS - Miscellaneous _5  - 0 External ear exam _6  - 0 Specimen Collection (cultures, biopsies, blood, body fluids, etc.) _7  - 0 Specimen(s) / Culture(s) sent or taken to Lab for analysis _8  - 0 Patient Transfer (multiple staff / Civil Service fast streamer / Similar devices) _9  - 0 Simple Staple / Suture removal (25 or less) _10  - 0 Complex Staple / Suture removal (26 or more) _11  - 0 Hypo / Hyperglycemic Management (close monitor of Blood Glucose) _12  - 0 Ankle / Brachial Index (ABI) - do not check if billed separately X- 1 5 Vital Signs Has the patient been seen at the hospital within the last three years: Yes Total Score: 155 Level Of Care: New/Established - Level 4 Electronic Signature(s) Signed: 04/25/2021 5:43:47 PM By: Levan Hurst RN, BSN Entered By: Levan Hurst on 04/21/2021 16:35:56 -------------------------------------------------------------------------------- Encounter Discharge Information Details Patient Name: Date of Service: [name], [name] 04/21/2021 2:15 PM Medical Record Number: [id number] Patient Account Number: [id number] Date of Birth/Sex: Treating RN: 10/21/1934 (86 y.o. [name] Primary Care Halena Mohar: Su Monks Other Clinician: Referring Kanai Hilger: Treating  Copper Kirtley/Extender: Kalman Shan MYA TT, JENNIFER Weeks in Treatment: 8 Encounter Discharge Information Items Discharge Condition: Stable Ambulatory Status: Wheelchair Discharge Destination: Home Transportation: Private Auto Accompanied By: son Schedule Follow-up Appointment: Yes Clinical Summary of Care: Patient Declined Electronic Signature(s) Signed: 04/21/2021 5:38:08 PM By: Rhae Hammock RN Entered By: Rhae Hammock on 04/21/2021 16:28:40 -------------------------------------------------------------------------------- Lower Extremity Assessment Details Patient Name: Date of Service: [name], [name] 04/21/2021 2:15 PM Medical Record Number: [id number] Patient Account Number: [id number] Date of Birth/Sex: Treating RN: 1934/11/07 (85 y.o. [name] Primary Care Dorien Bessent: Su Monks Other Clinician: Referring Jemima Petko: Treating Serra Younan/Extender: Kalman Shan MYA TT, JENNIFER Weeks in Treatment: 8 Edema Assessment Assessed: [Left: Yes] [Right: No] Edema: [Left: N] [Right: o] Calf Left: Right: Point of Measurement: From Medial Instep 20.5 cm Ankle Left: Right: Point of Measurement: From Medial Instep 17 cm Vascular Assessment Pulses: Dorsalis Pedis Palpable: [Left:Yes] Posterior Tibial Palpable: [Left:Yes] Electronic Signature(s) Signed: 04/21/2021 5:38:08 PM By: Rhae Hammock RN Entered By: Rhae Hammock on 04/21/2021 14:41:30 -------------------------------------------------------------------------------- Multi Wound Chart Details Patient Name: Date of Service: [name], [name] 04/21/2021 2:15 PM Medical Record Number: [id number] Patient Account Number: [id number] Date of Birth/Sex: Treating RN: 1935-10-15 (86 y.o. [name] Primary Care Rehaan Viloria: Su Monks Other Clinician: Referring Orella Cushman: Treating Karlynn Furrow/Extender: Kalman Shan MYA TT, JENNIFER Weeks in Treatment: 8 Vital Signs Height(in):  63 Pulse(bpm): 82 Weight(lbs): 80 Blood Pressure(mmHg): 94/59 Body Mass Index(BMI): 14 Temperature(F): 97.7 Respiratory Rate(breaths/min): 17 Photos: [1:No Photos Left Trochanter] [2:No Photos Left, Medial Lower Leg] [3:No Photos Left, Lateral Lower Leg] Wound Location: [1:Gradually Appeared] [2:Trauma] [3:Trauma] Wounding Event: [1:Pressure Ulcer] [2:Trauma, Other] [3:Trauma, Other] Primary Etiology: [1:Hypertension, Myocardial Infarction,] [2:Hypertension, Myocardial Infarction,] [3:Hypertension, Myocardial Infarction,] Comorbid History: [1:Crohns, Dementia 02/05/2021] [2:Crohns, Dementia 04/21/2021] [3:Crohns, Dementia 04/21/2021] Date Acquired: [1:8] [2:0] [3:0] Weeks of Treatment: [1:Open] [  2:Open] [3:Open] Wound Status: [1:1.3x1x1] [2:2x1x0.1] [3:1.2x0.7x0.1] Measurements L x W x D (cm) [1:1.021] [2:1.571] [3:0.66] A (cm) : rea [1:1.021] [2:0.157] [3:0.066] Volume (cm) : [1:91.30%] [2:N/A] [3:0.00%] % Reduction in A rea: [1:95.70%] [2:N/A] [3:28.30%] % Reduction in Volume: [1:9] Starting Position 1 (o'clock): [1:3] Ending Position 1 (o'clock): [1:4.5] Maximum Distance 1 (cm): [1:Yes] [2:No] [3:No] Undermining: [1:Category/Stage III] [2:Full Thickness Without Exposed] [3:Full Thickness Without Exposed] Classification: [1:Large] [2:Support Structures Medium] [3:Support Structures Medium] Exudate Amount: [1:Serosanguineous] [2:Serosanguineous] [3:Serosanguineous] Exudate Type: [1:red, brown] [2:red, brown] [3:red, brown] Exudate Color: [1:Well defined, not attached] [2:Distinct, outline attached] [3:Distinct, outline attached] Wound Margin: [1:Large (67-100%)] [2:Large (67-100%)] [3:Large (67-100%)] Granulation Amount: [1:Red, Pink] [2:Red, Pink] [3:Red, Pink] Granulation Quality: [1:None Present (0%)] [2:None Present (0%)] [3:None Present (0%)] Necrotic Amount: [1:Fat Layer (Subcutaneous Tissue): Yes Fascia: No] [3:Fascia: No] Exposed Structures: [1:Fascia: No Tendon: No Muscle:  No Joint: No Bone: No Small (1-33%)] [2:Fat Layer (Subcutaneous Tissue): No Tendon: No Muscle: No Joint: No Bone: No None] [3:Fat Layer (Subcutaneous Tissue): No Tendon: No Muscle: No Joint: No Bone: No None] Treatment Notes Electronic Signature(s) Signed: 04/21/2021 4:31:32 PM By: Kalman Shan DO Signed: 04/25/2021 5:43:47 PM By: Levan Hurst RN, BSN Entered By: Kalman Shan on 04/21/2021 16:04:56 -------------------------------------------------------------------------------- Multi-Disciplinary Care Plan Details Patient Name: Date of Service: [name], [name] 04/21/2021 2:15 PM Medical Record Number: [id number] Patient Account Number: [id number] Date of Birth/Sex: Treating RN: 12-08-1934 (86 y.o. [name] Primary Care Jinnie Onley: Su Monks Other Clinician: Referring Aima Mcwhirt: Treating Jorah Hua/Extender: Kalman Shan MYA TT, JENNIFER Weeks in Treatment: [place] reviewed with physician Active Inactive Pressure Nursing Diagnoses: Knowledge deficit related to causes and risk factors for pressure ulcer development Knowledge deficit related to management of pressures ulcers Potential for impaired tissue integrity related to pressure, friction, moisture, and shear Goals: Patient/caregiver will verbalize risk factors for pressure ulcer development Date Initiated: 02/20/2021 Target Resolution Date: 05/12/2021 Goal Status: Active Patient/caregiver will verbalize understanding of pressure ulcer management Date Initiated: 02/20/2021 Date Inactivated: 03/23/2021 Target Resolution Date: 03/24/2021 Goal Status: Met Interventions: Assess: immobility, friction, shearing, incontinence upon admission and as needed Assess offloading mechanisms upon admission and as needed Assess potential for pressure ulcer upon admission and as needed Provide education on pressure ulcers Notes: Wound/Skin Impairment Nursing Diagnoses: Impaired tissue  integrity Knowledge deficit related to ulceration/compromised skin integrity Goals: Patient/caregiver will verbalize understanding of skin care regimen Date Initiated: 02/20/2021 Target Resolution Date: 05/12/2021 Goal Status: Active Ulcer/skin breakdown will have a volume reduction of 30% by week 4 Date Initiated: 02/20/2021 Date Inactivated: 03/23/2021 Target Resolution Date: 03/24/2021 Unmet Reason: deeper according to Goal Status: Unmet wound care measurements. Interventions: Assess patient/caregiver ability to obtain necessary supplies Assess patient/caregiver ability to perform ulcer/skin care regimen upon admission and as needed Assess ulceration(s) every visit Provide education on ulcer and skin care Notes: Electronic Signature(s) Signed: 04/25/2021 5:43:47 PM By: Levan Hurst RN, BSN Entered By: Levan Hurst on 04/21/2021 16:34:41 -------------------------------------------------------------------------------- Pain Assessment Details Patient Name: Date of Service: [name], [name] 04/21/2021 2:15 PM Medical Record Number: [id number] Patient Account Number: [id number] Date of Birth/Sex: Treating RN: Aug 10, 1935 (86 y.o. [name] Primary Care Heylee Tant: Su Monks Other Clinician: Referring Chesnie Capell: Treating Quenton Recendez/Extender: Kalman Shan MYA TT, JENNIFER Weeks in Treatment: 8 Active Problems Location of Pain Severity and Description of Pain Patient Has Paino No Site Locations Pain Management and Medication Current Pain Management: Electronic Signature(s) Signed: 04/21/2021 5:38:08 PM By: Rhae Hammock RN Entered By:  Rhae Hammock on 04/21/2021 14:37:48 -------------------------------------------------------------------------------- Patient/Caregiver Education Details Patient Name: Date of Service: [name], [place] DIE Darnell Level 7/8/2022andnbsp2:15 PM Medical Record Number: [id number] Patient Account Number: [id number] Date of  Birth/Gender: Treating RN: 08-Mar-1935 (86 y.o. [name] Primary Care Physician: Su Monks Other Clinician: Referring Physician: Treating Physician/Extender: Kalman Shan MYA TT, JENNIFER Weeks in Treatment: 8 Education Assessment Education Provided To: Patient Education Topics Provided Wound/Skin Impairment: Methods: Explain/Verbal Responses: State content correctly Electronic Signature(s) Signed: 04/25/2021 5:43:47 PM By: Levan Hurst RN, BSN Entered By: Levan Hurst on 04/21/2021 16:35:03 -------------------------------------------------------------------------------- Wound Assessment Details Patient Name: Date of Service: [name], [name] 04/21/2021 2:15 PM Medical Record Number: [id number] Patient Account Number: [id number] Date of Birth/Sex: Treating RN: 1934/11/28 (85 y.o. [name] Primary Care Jenevieve Kirschbaum: Su Monks Other Clinician: Referring Mads Borgmeyer: Treating Emmauel Hallums/Extender: Kalman Shan MYA TT, JENNIFER Weeks in Treatment: 8 Wound Status Wound Number: 1 Primary Etiology: Pressure Ulcer Wound Location: Left Trochanter Wound Status: Open Wounding Event: Gradually Appeared Comorbid History: Hypertension, Myocardial Infarction, Crohns, Dementia Date Acquired: 02/05/2021 Weeks Of Treatment: 8 Clustered Wound: No Photos Photo Uploaded By: Sandre Kitty on 04/21/2021 16:54:11 Wound Measurements Length: (cm) 1.3 Width: (cm) 1 Depth: (cm) 1 Area: (cm) 1.021 Volume: (cm) 1.021 % Reduction in Area: 91.3% % Reduction in Volume: 95.7% Epithelialization: Small (1-33%) Tunneling: No Undermining: Yes Starting Position (o'clock): 9 Ending Position (o'clock): 3 Maximum Distance: (cm) 4.5 Wound Description Classification: Category/Stage III Wound Margin: Well defined, not attached Exudate Amount: Large Exudate Type: Serosanguineous Exudate Color: red, brown Foul Odor After Cleansing: No Slough/Fibrino Yes Wound  Bed Granulation Amount: Large (67-100%) Exposed Structure Granulation Quality: Red, Pink Fascia Exposed: No Necrotic Amount: None Present (0%) Fat Layer (Subcutaneous Tissue) Exposed: Yes Tendon Exposed: No Muscle Exposed: No Joint Exposed: No Bone Exposed: No Treatment Notes Wound #1 (Trochanter) Wound Laterality: Left Cleanser Soap and Water Discharge Instruction: May shower and wash wound with dial antibacterial soap and water prior to dressing change. Wound Cleanser Discharge Instruction: Cleanse the wound with wound cleanser or normal saline prior to applying a clean dressing using gauze sponges, not tissue or cotton balls. Peri-Wound Care Zinc Oxide Ointment 30g tube Discharge Instruction: Apply Zinc Oxide to excoriated areas Topical Primary Dressing Dakin's Solution 0.125%, 16 (oz) Discharge Instruction: Moisten gauze with Dakin's solution, lightly packing into undermining. Use Anasept gel in clinic. Secondary Dressing Bordered Gauze, 4x4 in Discharge Instruction: Apply over primary dressing as directed. Secured With Compression Wrap Compression Stockings Environmental education officer) Signed: 04/25/2021 5:43:47 PM By: Levan Hurst RN, BSN Entered By: Levan Hurst on 04/21/2021 15:21:31 -------------------------------------------------------------------------------- Wound Assessment Details Patient Name: Date of Service: [name], [name] 04/21/2021 2:15 PM Medical Record Number: [id number] Patient Account Number: [id number] Date of Birth/Sex: Treating RN: 1935-04-16 (86 y.o. [name], [name] Primary Care Zakery Normington: Su Monks Other Clinician: Referring Calynn Ferrero: Treating Calene Paradiso/Extender: Kalman Shan MYA TT, JENNIFER Weeks in Treatment: 8 Wound Status Wound Number: 2 Primary Etiology: Trauma, Other Wound Location: Left, Medial Lower Leg Wound Status: Open Wounding Event: Trauma Comorbid History: Hypertension, Myocardial Infarction,  Crohns, Dementia Date Acquired: 04/21/2021 Weeks Of Treatment: 0 Clustered Wound: No Photos Photo Uploaded By: Sandre Kitty on 04/21/2021 16:54:32 Wound Measurements Length: (cm) 2 Width: (cm) 1 Depth: (cm) 0.1 Area: (cm) 1.571 Volume: (cm) 0.157 % Reduction in Area: % Reduction in Volume: Epithelialization: None Tunneling: No Undermining: No Wound Description Classification: Full Thickness Without Exposed Support Structu Wound Margin: Distinct, outline attached Exudate Amount: Medium  Exudate Type: Serosanguineous Exudate Color: red, brown res Foul Odor After Cleansing: No Slough/Fibrino No Wound Bed Granulation Amount: Large (67-100%) Exposed Structure Granulation Quality: Red, Pink Fascia Exposed: No Necrotic Amount: None Present (0%) Fat Layer (Subcutaneous Tissue) Exposed: No Tendon Exposed: No Muscle Exposed: No Joint Exposed: No Bone Exposed: No Treatment Notes Wound #2 (Lower Leg) Wound Laterality: Left, Medial Cleanser Soap and Water Discharge Instruction: May shower and wash wound with dial antibacterial soap and water prior to dressing change. Peri-Wound Care Topical Primary Dressing Triple Antibiotic Ointment, 0.9 (g) packet Discharge Instruction: Apply to wound bed Secondary Dressing Bordered Gauze, 2x2 in Discharge Instruction: Apply over primary dressing as directed. Secured With Compression Wrap Compression Stockings Environmental education officer) Signed: 04/21/2021 5:38:08 PM By: Rhae Hammock RN Entered By: Rhae Hammock on 04/21/2021 14:43:45 -------------------------------------------------------------------------------- Wound Assessment Details Patient Name: Date of Service: [name], [name] 04/21/2021 2:15 PM Medical Record Number: [id number] Patient Account Number: [id number] Date of Birth/Sex: Treating RN: 20-Jul-1935 (86 y.o. [name] Primary Care Graci Hulce: Su Monks Other Clinician: Referring  Janaiya Beauchesne: Treating Rossetta Kama/Extender: Kalman Shan MYA TT, JENNIFER Weeks in Treatment: 8 Wound Status Wound Number: 3 Primary Etiology: Trauma, Other Wound Location: Left, Lateral Lower Leg Wound Status: Open Wounding Event: Trauma Comorbid History: Hypertension, Myocardial Infarction, Crohns, Dementia Date Acquired: 04/21/2021 Weeks Of Treatment: 0 Clustered Wound: No Photos Photo Uploaded By: Sandre Kitty on 04/21/2021 16:54:43 Wound Measurements Length: (cm) 1.2 Width: (cm) 0.7 Depth: (cm) 0.1 Area: (cm) 0.66 Volume: (cm) 0.066 % Reduction in Area: 0% % Reduction in Volume: 28.3% Epithelialization: None Tunneling: No Undermining: No Wound Description Classification: Full Thickness Without Exposed Support Structures Wound Margin: Distinct, outline attached Exudate Amount: Medium Exudate Type: Serosanguineous Exudate Color: red, brown Foul Odor After Cleansing: No Slough/Fibrino No Wound Bed Granulation Amount: Large (67-100%) Exposed Structure Granulation Quality: Red, Pink Fascia Exposed: No Necrotic Amount: None Present (0%) Fat Layer (Subcutaneous Tissue) Exposed: No Tendon Exposed: No Muscle Exposed: No Joint Exposed: No Bone Exposed: No Treatment Notes Wound #3 (Lower Leg) Wound Laterality: Left, Lateral Cleanser Soap and Water Discharge Instruction: May shower and wash wound with dial antibacterial soap and water prior to dressing change. Peri-Wound Care Topical Primary Dressing Triple Antibiotic Ointment, 0.9 (g) packet Discharge Instruction: Apply to wound bed Secondary Dressing Bordered Gauze, 2x2 in Discharge Instruction: Apply over primary dressing as directed. Secured With Compression Wrap Compression Stockings Environmental education officer) Signed: 04/25/2021 5:43:47 PM By: Levan Hurst RN, BSN Entered By: Levan Hurst on 04/21/2021  15:21:02 -------------------------------------------------------------------------------- Vitals Details Patient Name: Date of Service: [name], [name] 04/21/2021 2:15 PM Medical Record Number: [id number] Patient Account Number: [id number] Date of Birth/Sex: Treating RN: Sep 20, 1935 (86 y.o. [name], [name] Primary Care Leyani Gargus: Su Monks Other Clinician: Referring Nashley Cordoba: Treating Lavarr President/Extender: Kalman Shan MYA TT, JENNIFER Weeks in Treatment: 8 Vital Signs Time Taken: 14:37 Temperature (F): 97.7 Height (in): 63 Pulse (bpm): 82 Weight (lbs): 80 Respiratory Rate (breaths/min): 17 Body Mass Index (BMI): 14.2 Blood Pressure (mmHg): 94/59 Reference Range: 80 - 120 mg / dl Electronic Signature(s) Signed: 04/21/2021 5:38:08 PM By: Rhae Hammock RN Entered By: Rhae Hammock on 04/21/2021 14:38:15

## 2021-04-25 NOTE — Progress Notes (Signed)
TRULY, STANKIEWICZ ([id number]) Visit Report for 04/21/2021 Chief Complaint Document Details Patient Name: Date of Service: [name], [place] [name] 04/21/2021 2:15 PM Medical Record Number: [id number] Patient Account Number: [id number] Date of Birth/Sex: Treating RN: 1934-12-26 (86 y.o. [name] Primary Care Provider: Su Monks Other Clinician: Referring Provider: Treating Provider/Extender: Kalman Shan MYA TT, JENNIFER Weeks in Treatment: 8 Information Obtained from: Patient Chief Complaint Left hip wound Electronic Signature(s) Signed: 04/21/2021 4:31:32 PM By: Kalman Shan DO Entered By: Kalman Shan on 04/21/2021 16:05:06 -------------------------------------------------------------------------------- HPI Details Patient Name: Date of Service: [name], [name] 04/21/2021 2:15 PM Medical Record Number: [id number] Patient Account Number: [id number] Date of Birth/Sex: Treating RN: 05-06-1935 (85 y.o. [name] Primary Care Provider: Su Monks Other Clinician: Referring Provider: Treating Provider/Extender: Kalman Shan MYA TT, JENNIFER Weeks in Treatment: 8 History of Present Illness HPI Description: Admission 5/9 Ms. Foister is an 86 year old female with a past medical history of hypertension, hyperlipidemia, CAD s/p MI, and Alzheimer's Disease that presents to the clinic today for A left hip wound. The son is present and helps provide the history. This was noticed about 2 weeks ago and there was a scab to the area. When this was removed there was depth to the wound. It has gotten slightly larger over the past 3 days. Patient denies any fever/chills, nausea/vomiting and states she has been her usual state of health. Per son she has ample at home care . She has an aide that comes in 5 days a week. She is able to walk with support for short distances. This is limited due to her advanced dementia. She usually stays in a chair for most of the day and  then transfers to a bed at night. 5/19; patient presents for 1 week follow-up. Son was present and helped provide the history. She has been taking Augmentin without any issues. They have been applying Santyl daily to the wound. No complaints today. 6/2; patient presents for 1 week follow-up. Son is present and helps provide the history. She has been using Dakin's wet-to-dry. She states she feels well and denies any signs of infection. 6/9; patient presents for 1 week follow-up. Son is present and helps provide the history. He continues to do Dakin's wet-to-dry. Overall patient feels well and denies signs of infection. She is using her airflow mattress for about 12 hours a day. 6/23; patient presents for 2-week follow-up. Son is present. He has been using Dakin's wet-to-dry and has noticed improvement in the wound. There are no issues or complaints today. She denies signs of infection. 7/8; patient presents for 2-week follow-up. Son is present. She has been using silver alginate every other day to the wound bed. Unfortunately she has had some excoriation to the surrounding skin. She also developed 2 skin tears to her left leg and is not quite sure how this happened. She denies signs of infection. Electronic Signature(s) Signed: 04/21/2021 4:31:32 PM By: Kalman Shan DO Entered By: Kalman Shan on 04/21/2021 16:05:54 -------------------------------------------------------------------------------- Physical Exam Details Patient Name: Date of Service: [name], [name] 04/21/2021 2:15 PM Medical Record Number: [id number] Patient Account Number: [id number] Date of Birth/Sex: Treating RN: 21-Jul-1935 (86 y.o. [name] Primary Care Provider: Su Monks Other Clinician: Referring Provider: Treating Provider/Extender: Kalman Shan MYA TT, JENNIFER Weeks in Treatment: 8 Constitutional respirations regular, non-labored and within target range for patient.Marland Kitchen Psychiatric pleasant  and cooperative. Notes There is an open wound with granulation  tissue present and significant undermining circumferentially. No sloughing noted. Drainage is slightly thicker with odor. No obvious signs of infection. There is excoriated skin to the surrounding wound bed. She also has 2 skin tears to her left lower extremity with granulation tissue present. Electronic Signature(s) Signed: 04/21/2021 4:31:32 PM By: Kalman Shan DO Entered By: Kalman Shan on 04/21/2021 16:06:54 -------------------------------------------------------------------------------- Physician Orders Details Patient Name: Date of Service: [name], [name] 04/21/2021 2:15 PM Medical Record Number: [id number] Patient Account Number: [id number] Date of Birth/Sex: Treating RN: April 20, 1935 (86 y.o. [name] Primary Care Provider: Su Monks Other Clinician: Referring Provider: Treating Provider/Extender: Kalman Shan MYA TT, JENNIFER Weeks in Treatment: 8 Verbal / Phone Orders: No Diagnosis Coding ICD-10 Coding Code Description [phone] Pressure ulcer of left hip, stage 3 G30.9 Alzheimer's disease, unspecified I10 Essential (primary) hypertension N18.9 Chronic kidney disease, unspecified Follow-up Appointments ppointment in 2 weeks. - with Dr. Heber [place] Return A Bathing/ Shower/ Hygiene May shower with protection but do not get wound dressing(s) wet. Off-Loading Gel mattress overlay (Group 1) Roho cushion for wheelchair Turn and reposition every 2 hours [place] wound care orders this week; continue Home Health for wound care. May utilize formulary equivalent dressing for wound treatment orders unless otherwise specified. - Change primary dressing to Dakin's wet-to-dry on hip, apply neosporin and bandaid to skin tears on left lower leg Other Home Health Orders/Instructions: - [place] Wound Treatment Wound #1 - Trochanter Wound Laterality: Left Cleanser: Soap and Water 1 x Per  Day/15 Days Discharge Instructions: May shower and wash wound with dial antibacterial soap and water prior to dressing change. Cleanser: Wound Cleanser (Home Health) (Generic) 1 x Per Day/15 Days Discharge Instructions: Cleanse the wound with wound cleanser or normal saline prior to applying a clean dressing using gauze sponges, not tissue or cotton balls. Peri-Wound Care: Zinc Oxide Ointment 30g tube (Home Health) 1 x Per Day/15 Days Discharge Instructions: Apply Zinc Oxide to excoriated areas Prim Dressing: Dakin's Solution 0.125%, 16 (oz) (Home Health) 1 x Per Day/15 Days ary Discharge Instructions: Moisten gauze with Dakin's solution, lightly packing into undermining. Use Anasept gel in clinic. Secondary Dressing: Bordered Gauze, 4x4 in (Home Health) 1 x Per Day/15 Days Discharge Instructions: Apply over primary dressing as directed. Wound #2 - Lower Leg Wound Laterality: Left, Medial Cleanser: Soap and Water 1 x Per Day/15 Days Discharge Instructions: May shower and wash wound with dial antibacterial soap and water prior to dressing change. Prim Dressing: Triple Antibiotic Ointment, 0.9 (g) packet (Home Health) 1 x Per Day/15 Days ary Discharge Instructions: Apply to wound bed Secondary Dressing: Bordered Gauze, 2x2 in 1 x Per Day/15 Days Discharge Instructions: Apply over primary dressing as directed. Wound #3 - Lower Leg Wound Laterality: Left, Lateral Cleanser: Soap and Water 1 x Per BWI/20 Days Discharge Instructions: May shower and wash wound with dial antibacterial soap and water prior to dressing change. Prim Dressing: Triple Antibiotic Ointment, 0.9 (g) packet (Home Health) 1 x Per Day/15 Days ary Discharge Instructions: Apply to wound bed Secondary Dressing: Bordered Gauze, 2x2 in 1 x Per Day/15 Days Discharge Instructions: Apply over primary dressing as directed. Electronic Signature(s) Signed: 04/21/2021 4:31:32 PM By: Kalman Shan DO Entered By: Kalman Shan on  04/21/2021 16:07:14 -------------------------------------------------------------------------------- Problem List Details Patient Name: Date of Service: [name], [name] 04/21/2021 2:15 PM Medical Record Number: [id number] Patient Account Number: [id number] Date of Birth/Sex: Treating RN: 03/31/1935 (86 y.o. [name] Primary Care Provider: MYA  TT, JENNIFER Other Clinician: Referring Provider: Treating Provider/Extender: Kalman Shan MYA TT, JENNIFER Weeks in Treatment: 8 Active Problems ICD-10 Encounter Code Description Active Date MDM Diagnosis L89.223 Pressure ulcer of left hip, stage 3 02/20/2021 No Yes G30.9 Alzheimer's disease, unspecified 02/20/2021 No Yes I10 Essential (primary) hypertension 02/20/2021 No Yes N18.9 Chronic kidney disease, unspecified 02/20/2021 No Yes Inactive Problems Resolved Problems Electronic Signature(s) Signed: 04/21/2021 4:31:32 PM By: Kalman Shan DO Entered By: Kalman Shan on 04/21/2021 16:04:51 -------------------------------------------------------------------------------- Progress Note Details Patient Name: Date of Service: [name], [name] 04/21/2021 2:15 PM Medical Record Number: [id number] Patient Account Number: [id number] Date of Birth/Sex: Treating RN: 1935-04-03 (86 y.o. [name] Primary Care Provider: Su Monks Other Clinician: Referring Provider: Treating Provider/Extender: Kalman Shan MYA TT, JENNIFER Weeks in Treatment: 8 Subjective Chief Complaint Information obtained from Patient Left hip wound History of Present Illness (HPI) Admission 5/9 Ms. Keel is an 86 year old female with a past medical history of hypertension, hyperlipidemia, CAD s/p MI, and Alzheimer's Disease that presents to the clinic today for A left hip wound. The son is present and helps provide the history. This was noticed about 2 weeks ago and there was a scab to the area. When this was removed there was depth to the  wound. It has gotten slightly larger over the past 3 days. Patient denies any fever/chills, nausea/vomiting and states she has been her usual state of health. Per son she has ample at home care . She has an aide that comes in 5 days a week. She is able to walk with support for short distances. This is limited due to her advanced dementia. She usually stays in a chair for most of the day and then transfers to a bed at night. 5/19; patient presents for 1 week follow-up. Son was present and helped provide the history. She has been taking Augmentin without any issues. They have been applying Santyl daily to the wound. No complaints today. 6/2; patient presents for 1 week follow-up. Son is present and helps provide the history. She has been using Dakin's wet-to-dry. She states she feels well and denies any signs of infection. 6/9; patient presents for 1 week follow-up. Son is present and helps provide the history. He continues to do Dakin's wet-to-dry. Overall patient feels well and denies signs of infection. She is using her airflow mattress for about 12 hours a day. 6/23; patient presents for 2-week follow-up. Son is present. He has been using Dakin's wet-to-dry and has noticed improvement in the wound. There are no issues or complaints today. She denies signs of infection. 7/8; patient presents for 2-week follow-up. Son is present. She has been using silver alginate every other day to the wound bed. Unfortunately she has had some excoriation to the surrounding skin. She also developed 2 skin tears to her left leg and is not quite sure how this happened. She denies signs of infection. Patient History Unable to Obtain Patient History due to Dementia. Information obtained from Patient. Family History Unknown History. Social History Never smoker, Marital Status - Married, Alcohol Use - Never, Drug Use - No History, Caffeine Use - Never. Medical History Eyes Denies history of Cataracts, Glaucoma,  Optic Neuritis Ear/Nose/Mouth/Throat Denies history of Chronic sinus problems/congestion, Middle ear problems Hematologic/Lymphatic Denies history of Anemia, Hemophilia, Human Immunodeficiency Virus, Lymphedema, Sickle Cell Disease Respiratory Denies history of Aspiration, Asthma, Chronic Obstructive Pulmonary Disease (COPD), Pneumothorax, Sleep Apnea, Tuberculosis Cardiovascular Patient has history of Hypertension, Myocardial Infarction -  2019 Denies history of Angina, Arrhythmia, Congestive Heart Failure, Coronary Artery Disease, Deep Vein Thrombosis, Hypotension, Peripheral Arterial Disease, Peripheral Venous Disease, Phlebitis, Vasculitis Gastrointestinal Patient has history of Crohnoos Denies history of Cirrhosis , Colitis, Hepatitis A, Hepatitis B, Hepatitis C Endocrine Denies history of Type I Diabetes, Type II Diabetes Genitourinary Denies history of End Stage Renal Disease Immunological Denies history of Lupus Erythematosus, Raynaudoos, Scleroderma Integumentary (Skin) Denies history of History of Burn Musculoskeletal Denies history of Gout, Rheumatoid Arthritis, Osteoarthritis, Osteomyelitis Neurologic Patient has history of Dementia Denies history of Neuropathy, Quadriplegia, Paraplegia, Seizure Disorder Oncologic Denies history of Received Chemotherapy, Received Radiation Psychiatric Denies history of Anorexia/bulimia, Confinement Anxiety Hospitalization/Surgery History - left heart cath and coronary angiography. - cardiac cath. - EGD. - colectomy. - dilation and curettage of uterus. - lap right hemicolectomy. Medical A Surgical History Notes nd Gastrointestinal incontienent Genitourinary incontinent; CKD stage 4 Neurologic hx TIA; alzheimer's Objective Constitutional respirations regular, non-labored and within target range for patient.. Vitals Time Taken: 2:37 PM, Height: 63 in, Weight: 80 lbs, BMI: 14.2, Temperature: 97.7 F, Pulse: 82 bpm, Respiratory  Rate: 17 breaths/min, Blood Pressure: 94/59 mmHg. Psychiatric pleasant and cooperative. General Notes: There is an open wound with granulation tissue present and significant undermining circumferentially. No sloughing noted. Drainage is slightly thicker with odor. No obvious signs of infection. There is excoriated skin to the surrounding wound bed. She also has 2 skin tears to her left lower extremity with granulation tissue present. Integumentary (Hair, Skin) Wound #1 status is Open. Original cause of wound was Gradually Appeared. The date acquired was: 02/05/2021. The wound has been in treatment 8 weeks. The wound is located on the Left Trochanter. The wound measures 1.3cm length x 1cm width x 1cm depth; 1.021cm^2 area and 1.021cm^3 volume. There is Fat Layer (Subcutaneous Tissue) exposed. There is no tunneling noted, however, there is undermining starting at 9:00 and ending at 3:00 with a maximum distance of 4.5cm. There is a large amount of serosanguineous drainage noted. The wound margin is well defined and not attached to the wound base. There is large (67- 100%) red, pink granulation within the wound bed. There is no necrotic tissue within the wound bed. Wound #2 status is Open. Original cause of wound was Trauma. The date acquired was: 04/21/2021. The wound is located on the Left,Medial Lower Leg. The wound measures 2cm length x 1cm width x 0.1cm depth; 1.571cm^2 area and 0.157cm^3 volume. There is no tunneling or undermining noted. There is a medium amount of serosanguineous drainage noted. The wound margin is distinct with the outline attached to the wound base. There is large (67-100%) red, pink granulation within the wound bed. There is no necrotic tissue within the wound bed. Wound #3 status is Open. Original cause of wound was Trauma. The date acquired was: 04/21/2021. The wound is located on the Left,Lateral Lower Leg. The wound measures 1.2cm length x 0.7cm width x 0.1cm depth; 0.66cm^2  area and 0.066cm^3 volume. There is no tunneling or undermining noted. There is a medium amount of serosanguineous drainage noted. The wound margin is distinct with the outline attached to the wound base. There is large (67-100%) red, pink granulation within the wound bed. There is no necrotic tissue within the wound bed. Assessment Active Problems ICD-10 Pressure ulcer of left hip, stage 3 Alzheimer's disease, unspecified Essential (primary) hypertension Chronic kidney disease, unspecified Overall wound is stable however there is now slightly more thickened drainage and odor present. I think she would do best  with Dakin's wet to dry again. We will switch to this. The excoriated skin outside the wound I recommended zinc oxide. For her skin tears on her left lower extremity I recommended antibiotic ointment and keeping them covered. There is no obvious signs of infection on exam. We discussed possibly using a wet gauze wound VAC and we will rediscuss this at next clinic visit. Plan Follow-up Appointments: Return Appointment in 2 weeks. - with Dr. Heber [place] Bathing/ Shower/ Hygiene: May shower with protection but do not get wound dressing(s) wet. Off-Loading: Gel mattress overlay (Group 1) Roho cushion for wheelchair Turn and reposition every 2 hours Home Health: New wound care orders this week; continue Home Health for wound care. May utilize formulary equivalent dressing for wound treatment orders unless otherwise specified. - Change primary dressing to Dakin's wet-to-dry on hip, apply neosporin and bandaid to skin tears on left lower leg Other Home Health Orders/Instructions: [facility] WOUND #1: - Trochanter Wound Laterality: Left Cleanser: Soap and Water 1 x Per Day/15 Days Discharge Instructions: May shower and wash wound with dial antibacterial soap and water prior to dressing change. Cleanser: Wound Cleanser (Home Health) (Generic) 1 x Per Day/15 Days Discharge Instructions: Cleanse  the wound with wound cleanser or normal saline prior to applying a clean dressing using gauze sponges, not tissue or cotton balls. Peri-Wound Care: Zinc Oxide Ointment 30g tube (Home Health) 1 x Per Day/15 Days Discharge Instructions: Apply Zinc Oxide to excoriated areas Prim Dressing: Dakin's Solution 0.125%, 16 (oz) (Home Health) 1 x Per Day/15 Days ary Discharge Instructions: Moisten gauze with Dakin's solution, lightly packing into undermining. Use Anasept gel in clinic. Secondary Dressing: Bordered Gauze, 4x4 in (Home Health) 1 x Per Day/15 Days Discharge Instructions: Apply over primary dressing as directed. WOUND #2: - Lower Leg Wound Laterality: Left, Medial Cleanser: Soap and Water 1 x Per Day/15 Days Discharge Instructions: May shower and wash wound with dial antibacterial soap and water prior to dressing change. Prim Dressing: Triple Antibiotic Ointment, 0.9 (g) packet (Home Health) 1 x Per Day/15 Days ary Discharge Instructions: Apply to wound bed Secondary Dressing: Bordered Gauze, 2x2 in 1 x Per Day/15 Days Discharge Instructions: Apply over primary dressing as directed. WOUND #3: - Lower Leg Wound Laterality: Left, Lateral Cleanser: Soap and Water 1 x Per Day/15 Days Discharge Instructions: May shower and wash wound with dial antibacterial soap and water prior to dressing change. Prim Dressing: Triple Antibiotic Ointment, 0.9 (g) packet (Home Health) 1 x Per Day/15 Days ary Discharge Instructions: Apply to wound bed Secondary Dressing: Bordered Gauze, 2x2 in 1 x Per Day/15 Days Discharge Instructions: Apply over primary dressing as directed. 1. Change to Dakin's wet to dry 2. Zinc oxide to the excoriated skin 3. Antibiotic ointment to the left lower extremity 4. Follow-up in 2 weeks Electronic Signature(s) Signed: 04/21/2021 4:31:32 PM By: Kalman Shan DO Entered By: Kalman Shan on 04/21/2021  16:30:13 -------------------------------------------------------------------------------- HxROS Details Patient Name: Date of Service: [name], [name] 04/21/2021 2:15 PM Medical Record Number: [id number] Patient Account Number: [id number] Date of Birth/Sex: Treating RN: [birth year] (86 y.o. [name] Primary Care Provider: Su Monks Other Clinician: Referring Provider: Treating Provider/Extender: Kalman Shan MYA TT, JENNIFER Weeks in Treatment: 8 Unable to Obtain Patient History due to Dementia Information Obtained From Patient Eyes Medical History: Negative for: Cataracts; Glaucoma; Optic Neuritis Ear/Nose/Mouth/Throat Medical History: Negative for: Chronic sinus problems/congestion; Middle ear problems Hematologic/Lymphatic Medical History: Negative for: Anemia; Hemophilia; Human Immunodeficiency Virus; Lymphedema; Sickle  Cell Disease Respiratory Medical History: Negative for: Aspiration; Asthma; Chronic Obstructive Pulmonary Disease (COPD); Pneumothorax; Sleep Apnea; Tuberculosis Cardiovascular Medical History: Positive for: Hypertension; Myocardial Infarction - 2019 Negative for: Angina; Arrhythmia; Congestive Heart Failure; Coronary Artery Disease; Deep Vein Thrombosis; Hypotension; Peripheral Arterial Disease; Peripheral Venous Disease; Phlebitis; Vasculitis Gastrointestinal Medical History: Positive for: Crohns Negative for: Cirrhosis ; Colitis; Hepatitis A; Hepatitis B; Hepatitis C Past Medical History Notes: incontienent Endocrine Medical History: Negative for: Type I Diabetes; Type II Diabetes Genitourinary Medical History: Negative for: End Stage Renal Disease Past Medical History Notes: incontinent; CKD stage 4 Immunological Medical History: Negative for: Lupus Erythematosus; Raynauds; Scleroderma Integumentary (Skin) Medical History: Negative for: History of Burn Musculoskeletal Medical History: Negative for: Gout; Rheumatoid  Arthritis; Osteoarthritis; Osteomyelitis Neurologic Medical History: Positive for: Dementia Negative for: Neuropathy; Quadriplegia; Paraplegia; Seizure Disorder Past Medical History Notes: hx TIA; alzheimer's Oncologic Medical History: Negative for: Received Chemotherapy; Received Radiation Psychiatric Medical History: Negative for: Anorexia/bulimia; Confinement Anxiety Immunizations Pneumococcal Vaccine: Received Pneumococcal Vaccination: No Implantable Devices None Hospitalization / Surgery History Type of Hospitalization/Surgery left heart cath and coronary angiography cardiac cath EGD colectomy dilation and curettage of uterus lap right hemicolectomy Family and Social History Unknown History: Yes; Never smoker; Marital Status - Married; Alcohol Use: Never; Drug Use: No History; Caffeine Use: Never; Financial Concerns: No; Food, Clothing or Shelter Needs: No; Support System Lacking: No; Transportation Concerns: No Electronic Signature(s) Signed: 04/21/2021 4:31:32 PM By: Kalman Shan DO Signed: 04/25/2021 5:43:47 PM By: Levan Hurst RN, BSN Entered By: Kalman Shan on 04/21/2021 16:05:59 -------------------------------------------------------------------------------- SuperBill Details Patient Name: Date of Service: [name], [name] 04/21/2021 Medical Record Number: [id number] Patient Account Number: [id number] Date of Birth/Sex: Treating RN: 25-Nov-1934 (86 y.o. [name] Primary Care Provider: Su Monks Other Clinician: Referring Provider: Treating Provider/Extender: Kalman Shan MYA TT, JENNIFER Weeks in Treatment: 8 Diagnosis Coding ICD-10 Codes Code Description [phone] Pressure ulcer of left hip, stage 3 G30.9 Alzheimer's disease, unspecified I10 Essential (primary) hypertension N18.9 Chronic kidney disease, unspecified Facility Procedures CPT4 Code: [id number] Description: 8676 - WOUND CARE VISIT-LEV 4 EST  PT Modifier: Quantity: 1 Physician Procedures : CPT4 Code Description Modifier [id number] - WC PHYS LEVEL 3 - EST PT ICD-10 Diagnosis Description L89.223 Pressure ulcer of left hip, stage 3 G30.9 Alzheimer's disease, unspecified I10 Essential (primary) hypertension N18.9 Chronic kidney disease,  unspecified Quantity: 1 Electronic Signature(s) Signed: 04/21/2021 4:56:18 PM By: Kalman Shan DO Signed: 04/25/2021 5:43:47 PM By: Levan Hurst RN, BSN Previous Signature: 04/21/2021 4:31:32 PM Version By: Kalman Shan DO Entered By: Levan Hurst on 04/21/2021 16:36:05

## 2021-05-02 NOTE — Progress Notes (Signed)
AILEY, WESSLING ([id number]) Visit Report for 04/06/2021 Arrival Information Details Patient Name: Date of Service: [name], [place] [name] 04/06/2021 2:15 PM Medical Record Number: [id number] Patient Account Number: [id number] Date of Birth/Sex: Treating RN: 10/09/1935 (86 y.o. [name], Meta.Reding Primary Care Honest Vanleer: Su Monks Other Clinician: Referring Toria Monte: Treating Myisha Pickerel/Extender: Kalman Shan MYA TT, JENNIFER Weeks in Treatment: 6 Visit Information History Since Last Visit Added or deleted any medications: No Patient Arrived: Wheel Chair Any new allergies or adverse reactions: No Arrival Time: 14:54 Had a fall or experienced change in No Accompanied By: son activities of daily living that may affect Transfer Assistance: None risk of falls: Patient Identification Verified: Yes Signs or symptoms of abuse/neglect since last visito No Secondary Verification Process Completed: Yes Hospitalized since last visit: No Patient Requires Transmission-Based Precautions: No Implantable device outside of the clinic excluding No Patient Has Alerts: No cellular tissue based products placed in the center since last visit: Has Dressing in Place as Prescribed: Yes Pain Present Now: No Electronic Signature(s) Signed: 05/02/2021 9:37:57 PM By: Deon Pilling Previous Signature: 05/01/2021 9:17:01 PM Version By: Deon Pilling Entered By: Deon Pilling on 05/02/2021 21:36:42 -------------------------------------------------------------------------------- Clinic Level of Care Assessment Details Patient Name: Date of Service: [name], [place] [name] 04/06/2021 2:15 PM Medical Record Number: [id number] Patient Account Number: [id number] Date of Birth/Sex: Treating RN: 02-02-1935 (86 y.o. [name], [name] Primary Care Anthone Prieur: Su Monks Other Clinician: Referring Danyl Deems: Treating Brydan Downard/Extender: Kalman Shan MYA TT, JENNIFER Weeks in Treatment: 6 Clinic Level of Care  Assessment Items TOOL 4 Quantity Score X- 1 0 Use when only an EandM is performed on FOLLOW-UP visit ASSESSMENTS - Nursing Assessment / Reassessment X- 1 10 Reassessment of Co-morbidities (includes updates in patient status) X- 1 5 Reassessment of Adherence to Treatment Plan ASSESSMENTS - Wound and Skin A ssessment / Reassessment X - Simple Wound Assessment / Reassessment - one wound 1 5 []  - 0 Complex Wound Assessment / Reassessment - multiple wounds X- 1 10 Dermatologic / Skin Assessment (not related to wound area) ASSESSMENTS - Focused Assessment []  - 0 Circumferential Edema Measurements - multi extremities []  - 0 Nutritional Assessment / Counseling / Intervention []  - 0 Lower Extremity Assessment (monofilament, tuning fork, pulses) []  - 0 Peripheral Arterial Disease Assessment (using hand held doppler) ASSESSMENTS - Ostomy and/or Continence Assessment and Care []  - 0 Incontinence Assessment and Management []  - 0 Ostomy Care Assessment and Management (repouching, etc.) PROCESS - Coordination of Care X - Simple Patient / Family Education for ongoing care 1 15 []  - 0 Complex (extensive) Patient / Family Education for ongoing care X- 1 10 Staff obtains Programmer, systems, Records, T Results / Process Orders est X- 1 10 Staff telephones HHA, Nursing Homes / Clarify orders / etc []  - 0 Routine Transfer to another Facility (non-emergent condition) []  - 0 Routine Hospital Admission (non-emergent condition) []  - 0 New Admissions / Biomedical engineer / Ordering NPWT Apligraf, etc. , []  - 0 Emergency Hospital Admission (emergent condition) X- 1 10 Simple Discharge Coordination []  - 0 Complex (extensive) Discharge Coordination PROCESS - Special Needs []  - 0 Pediatric / Minor Patient Management []  - 0 Isolation Patient Management []  - 0 Hearing / Language / Visual special needs []  - 0 Assessment of Community assistance (transportation, D/C planning, etc.) []  -  0 Additional assistance / Altered mentation []  - 0 Support Surface(s) Assessment (bed, cushion, seat, etc.) INTERVENTIONS - Wound Cleansing / Measurement X - Simple Wound Cleansing -  one wound 1 5 []  - 0 Complex Wound Cleansing - multiple wounds X- 1 5 Wound Imaging (photographs - any number of wounds) []  - 0 Wound Tracing (instead of photographs) X- 1 5 Simple Wound Measurement - one wound []  - 0 Complex Wound Measurement - multiple wounds INTERVENTIONS - Wound Dressings X - Small Wound Dressing one or multiple wounds 1 10 []  - 0 Medium Wound Dressing one or multiple wounds []  - 0 Large Wound Dressing one or multiple wounds X- 1 5 Application of Medications - topical []  - 0 Application of Medications - injection INTERVENTIONS - Miscellaneous []  - 0 External ear exam []  - 0 Specimen Collection (cultures, biopsies, blood, body fluids, etc.) []  - 0 Specimen(s) / Culture(s) sent or taken to Lab for analysis []  - 0 Patient Transfer (multiple staff / Civil Service fast streamer / Similar devices) []  - 0 Simple Staple / Suture removal (25 or less) []  - 0 Complex Staple / Suture removal (26 or more) []  - 0 Hypo / Hyperglycemic Management (close monitor of Blood Glucose) []  - 0 Ankle / Brachial Index (ABI) - do not check if billed separately X- 1 5 Vital Signs Has the patient been seen at the hospital within the last three years: Yes Total Score: 110 Level Of Care: New/Established - Level 3 Electronic Signature(s) Signed: 04/07/2021 6:18:32 PM By: Rhae Hammock RN Entered By: Rhae Hammock on 04/06/2021 15:33:17 -------------------------------------------------------------------------------- Encounter Discharge Information Details Patient Name: Date of Service: [name], [name] 04/06/2021 2:15 PM Medical Record Number: [id number] Patient Account Number: [id number] Date of Birth/Sex: Treating RN: [birth year] (86 y.o. Debby Bud Primary Care Karrisa Didio: Su Monks Other  Clinician: Referring Elliana Bal: Treating Nhi Butrum/Extender: Kalman Shan MYA TT, JENNIFER Weeks in Treatment: 6 Encounter Discharge Information Items Discharge Condition: Stable Ambulatory Status: Wheelchair Discharge Destination: Home Transportation: Private Auto Accompanied By: son Schedule Follow-up Appointment: No Clinical Summary of Care: Patient Declined Electronic Signature(s) Signed: 05/02/2021 9:37:57 PM By: Deon Pilling Previous Signature: 05/01/2021 9:17:01 PM Version By: Deon Pilling Entered By: Deon Pilling on 05/02/2021 21:37:40 -------------------------------------------------------------------------------- Multi Wound Chart Details Patient Name: Date of Service: [name], [name] 04/06/2021 2:15 PM Medical Record Number: [id number] Patient Account Number: [id number] Date of Birth/Sex: Treating RN: 1935/04/15 (86 y.o. [name], [name] Primary Care Metta Koranda: Su Monks Other Clinician: Referring Meriam Chojnowski: Treating Mileidy Atkin/Extender: Kalman Shan MYA TT, JENNIFER Weeks in Treatment: 6 Vital Signs Height(in): 63 Pulse(bpm): 108 Weight(lbs): 80 Blood Pressure(mmHg): 112/64 Body Mass Index(BMI): 14 Temperature(F): 97.5 Respiratory Rate(breaths/min): 18 Photos: [1:No Photos Left Trochanter] [N/A:N/A N/A] Wound Location: [1:Gradually Appeared] [N/A:N/A] Wounding Event: [1:Pressure Ulcer] [N/A:N/A] Primary Etiology: [1:Hypertension, Myocardial Infarction, N/A] Comorbid History: [1:Crohns, Dementia 02/05/2021] [N/A:N/A] Date Acquired: [1:6] [N/A:N/A] Weeks of Treatment: [1:Open] [N/A:N/A] Wound Status: [1:1x0.7x1.1] [N/A:N/A] Measurements L x W x D (cm) [1:0.55] [N/A:N/A] A (cm) : rea [1:0.605] [N/A:N/A] Volume (cm) : [1:95.30%] [N/A:N/A] % Reduction in A rea: [1:97.40%] [N/A:N/A] % Reduction in Volume: [1:1] Starting Position 1 (o'clock): [1:5] Ending Position 1 (o'clock): [1:1.7] Maximum Distance 1 (cm): [1:Yes] [N/A:N/A] Undermining:  [1:Category/Stage III] [N/A:N/A] Classification: [1:Large] [N/A:N/A] Exudate A mount: [1:Serosanguineous] [N/A:N/A] Exudate Type: [1:red, brown] [N/A:N/A] Exudate Color: [1:Well defined, not attached] [N/A:N/A] Wound Margin: [1:Large (67-100%)] [N/A:N/A] Granulation A mount: [1:Red, Pink] [N/A:N/A] Granulation Quality: [1:None Present (0%)] [N/A:N/A] Necrotic A mount: [1:Fat Layer (Subcutaneous Tissue): Yes N/A] Exposed Structures: [1:Fascia: No Tendon: No Muscle: No Joint: No Bone: No Small (1-33%)] [N/A:N/A] Treatment Notes Electronic Signature(s) Signed: 04/06/2021 4:31:21 PM By: Kalman Shan  DO Signed: 04/07/2021 6:18:32 PM By: Rhae Hammock RN Entered By: Kalman Shan on 04/06/2021 15:14:15 -------------------------------------------------------------------------------- Multi-Disciplinary Care Plan Details Patient Name: Date of Service: [name], [name] 04/06/2021 2:15 PM Medical Record Number: [id number] Patient Account Number: [id number] Date of Birth/Sex: Treating RN: June 25, 1935 (86 y.o. [name], [name] Primary Care Amoreena Neubert: Su Monks Other Clinician: Referring Janesia Joswick: Treating Iven Earnhart/Extender: Kalman Shan MYA TT, JENNIFER Weeks in Treatment: [place] reviewed with physician Active Inactive Abuse / Safety / Falls / Self Care Management Nursing Diagnoses: Potential for falls Potential for injury related to falls Goals: Patient/caregiver will verbalize/demonstrate measures taken to improve the patient's personal safety Date Initiated: 02/20/2021 Target Resolution Date: 04/14/2021 Goal Status: Active Patient/caregiver will verbalize/demonstrate measures taken to prevent injury and/or falls Date Initiated: 02/20/2021 Date Inactivated: 03/23/2021 Target Resolution Date: 03/24/2021 Goal Status: Met Interventions: Assess Activities of Daily Living upon admission and as needed Assess fall risk on admission and as  needed Assess: immobility, friction, shearing, incontinence upon admission and as needed Assess impairment of mobility on admission and as needed per policy Assess personal safety and home safety (as indicated) on admission and as needed Assess self care needs on admission and as needed Provide education on fall prevention Provide education on personal and home safety Notes: Nutrition Nursing Diagnoses: Potential for alteratiion in Nutrition/Potential for imbalanced nutrition Goals: Patient/caregiver agrees to and verbalizes understanding of need to use nutritional supplements and/or vitamins as prescribed Date Initiated: 02/20/2021 Target Resolution Date: 04/14/2021 Goal Status: Active Interventions: Assess patient nutrition upon admission and as needed per policy Provide education on nutrition Treatment Activities: Education provided on Nutrition : 03/23/2021 Notes: Pressure Nursing Diagnoses: Knowledge deficit related to causes and risk factors for pressure ulcer development Knowledge deficit related to management of pressures ulcers Potential for impaired tissue integrity related to pressure, friction, moisture, and shear Goals: Patient/caregiver will verbalize risk factors for pressure ulcer development Date Initiated: 02/20/2021 Target Resolution Date: 04/14/2021 Goal Status: Active Patient/caregiver will verbalize understanding of pressure ulcer management Date Initiated: 02/20/2021 Date Inactivated: 03/23/2021 Target Resolution Date: 03/24/2021 Goal Status: Met Interventions: Assess: immobility, friction, shearing, incontinence upon admission and as needed Assess offloading mechanisms upon admission and as needed Assess potential for pressure ulcer upon admission and as needed Provide education on pressure ulcers Notes: Wound/Skin Impairment Nursing Diagnoses: Impaired tissue integrity Knowledge deficit related to ulceration/compromised skin integrity Goals: Patient/caregiver  will verbalize understanding of skin care regimen Date Initiated: 02/20/2021 Target Resolution Date: 04/14/2021 Goal Status: Active Ulcer/skin breakdown will have a volume reduction of 30% by week 4 Date Initiated: 02/20/2021 Date Inactivated: 03/23/2021 Target Resolution Date: 03/24/2021 Unmet Reason: deeper according to Goal Status: Unmet wound care measurements. Interventions: Assess patient/caregiver ability to obtain necessary supplies Assess patient/caregiver ability to perform ulcer/skin care regimen upon admission and as needed Assess ulceration(s) every visit Provide education on ulcer and skin care Notes: Electronic Signature(s) Signed: 04/07/2021 6:18:32 PM By: Rhae Hammock RN Entered By: Rhae Hammock on 04/06/2021 15:28:33 -------------------------------------------------------------------------------- Pain Assessment Details Patient Name: Date of Service: [name], [name] 04/06/2021 2:15 PM Medical Record Number: [id number] Patient Account Number: [id number] Date of Birth/Sex: Treating RN: 1935-05-21 (86 y.o. Debby Bud Primary Care Shelitha Magley: Su Monks Other Clinician: Referring Junie Avilla: Treating Annitta Fifield/Extender: Kalman Shan MYA TT, JENNIFER Weeks in Treatment: 6 Active Problems Location of Pain Severity and Description of Pain Patient Has Paino No Site Locations Pain Management and Medication Current Pain Management: Electronic Signature(s) Signed: 05/02/2021 9:37:57 PM By:  Deon Pilling Previous Signature: 05/01/2021 9:17:01 PM Version By: Deon Pilling Entered By: Deon Pilling on 05/02/2021 21:36:57 -------------------------------------------------------------------------------- Patient/Caregiver Education Details Patient Name: Date of Service: [place] Shelbie Proctor DIE [place] Level 6/23/2022andnbsp2:15 PM Medical Record Number: [id number] Patient Account Number: [id number] Date of Birth/Gender: Treating RN: 1935/10/09 (86 y.o. [name] Primary Care Physician: Su Monks Other Clinician: Referring Physician: Treating Physician/Extender: Kalman Shan MYA TT, JENNIFER Weeks in Treatment: 6 Education Assessment Education Provided To: Patient Education Topics Provided Nutrition: Methods: Explain/Verbal Responses: State content correctly Wound/Skin Impairment: Methods: Explain/Verbal Responses: State content correctly Electronic Signature(s) Signed: 04/07/2021 6:18:32 PM By: Rhae Hammock RN Entered By: Rhae Hammock on 04/06/2021 15:32:36 -------------------------------------------------------------------------------- Wound Assessment Details Patient Name: Date of Service: [name], [name] 04/06/2021 2:15 PM Medical Record Number: [id number] Patient Account Number: [id number] Date of Birth/Sex: Treating RN: 11/29/1934 (86 y.o. Debby Bud Primary Care Tyrah Broers: Su Monks Other Clinician: Referring Zarion Oliff: Treating Charmane Protzman/Extender: Kalman Shan MYA TT, JENNIFER Weeks in Treatment: 6 Wound Status Wound Number: 1 Primary Etiology: Pressure Ulcer Wound Location: Left Trochanter Wound Status: Open Wounding Event: Gradually Appeared Comorbid History: Hypertension, Myocardial Infarction, Crohns, Dementia Date Acquired: 02/05/2021 Weeks Of Treatment: 6 Clustered Wound: No Photos Wound Measurements Length: (cm) 1 Width: (cm) 0.7 Depth: (cm) 1.1 Area: (cm) 0.55 Volume: (cm) 0.605 % Reduction in Area: 95.3% % Reduction in Volume: 97.4% Epithelialization: Small (1-33%) Tunneling: No Undermining: Yes Starting Position (o'clock): 1 Ending Position (o'clock): 5 Maximum Distance: (cm) 1.7 Wound Description Classification: Category/Stage III Wound Margin: Well defined, not attached Exudate Amount: Large Exudate Type: Serosanguineous Exudate Color: red, brown Foul Odor After Cleansing: No Slough/Fibrino Yes Wound Bed Granulation Amount: Large (67-100%)  Exposed Structure Granulation Quality: Red, Pink Fascia Exposed: No Necrotic Amount: None Present (0%) Fat Layer (Subcutaneous Tissue) Exposed: Yes Tendon Exposed: No Muscle Exposed: No Joint Exposed: No Bone Exposed: No Treatment Notes Wound #1 (Trochanter) Wound Laterality: Left Cleanser Soap and Water Discharge Instruction: May shower and wash wound with dial antibacterial soap and water prior to dressing change. Wound Cleanser Discharge Instruction: Cleanse the wound with wound cleanser prior to applying a clean dressing using gauze sponges, not tissue or cotton balls. Peri-Wound Care Topical Primary Dressing KerraCel Ag Gelling Fiber Dressing, 4x5 in (silver alginate) Discharge Instruction: Apply silver alginate to wound bed as instructed Secondary Dressing Bordered Gauze, 4x4 in Discharge Instruction: Apply over primary dressing as directed. Secured With Compression Wrap Compression Stockings Environmental education officer) Signed: 05/02/2021 9:37:57 PM By: Deon Pilling Previous Signature: 04/07/2021 12:34:28 PM Version By: Sandre Kitty Previous Signature: 05/01/2021 9:17:01 PM Version By: Deon Pilling Entered By: Deon Pilling on 05/02/2021 21:37:11 -------------------------------------------------------------------------------- Vitals Details Patient Name: Date of Service: [name], [name] 04/06/2021 2:15 PM Medical Record Number: [id number] Patient Account Number: [id number] Date of Birth/Sex: Treating RN: 1935/10/02 (86 y.o. Debby Bud Primary Care Gerard Bonus: Su Monks Other Clinician: Referring Amaira Safley: Treating Tekia Waterbury/Extender: Kalman Shan MYA TT, JENNIFER Weeks in Treatment: 6 Vital Signs Time Taken: 14:54 Temperature (F): 97.5 Height (in): 63 Pulse (bpm): 108 Weight (lbs): 80 Respiratory Rate (breaths/min): 18 Body Mass Index (BMI): 14.2 Blood Pressure (mmHg): 112/64 Reference Range: 80 - 120 mg / dl Electronic  Signature(s) Signed: 05/02/2021 9:37:57 PM By: Deon Pilling Previous Signature: 05/01/2021 9:17:01 PM Version By: Deon Pilling Entered By: Deon Pilling on 05/02/2021 69:45:03

## 2021-05-04 ENCOUNTER — Encounter (HOSPITAL_BASED_OUTPATIENT_CLINIC_OR_DEPARTMENT_OTHER): Payer: Medicare Other | Admitting: Internal Medicine

## 2021-05-04 ENCOUNTER — Other Ambulatory Visit: Payer: Self-pay

## 2021-05-04 DIAGNOSIS — L89223 Pressure ulcer of left hip, stage 3: Secondary | ICD-10-CM

## 2021-05-08 NOTE — Progress Notes (Signed)
DEBORRAH, [name] ([id number]) Visit Report for 05/04/2021 Chief Complaint Document Details Patient Name: Date of Service: [name], [place] Rhonda G. 05/04/2021 1:45 PM Medical Record Number: [id number] Patient Account Number: [id number] Date of Birth/Sex: Treating RN: Jan 09, 1935 (86 y.o. [name] Primary Care Provider: Su Monks Other Clinician: Referring Provider: Treating Provider/Extender: Kalman Shan MYA TT, JENNIFER Weeks in Treatment: 10 Information Obtained from: Patient Chief Complaint Left hip wound and Right lateral ankle wound Electronic Signature(s) Signed: 05/08/2021 4:40:45 PM By: Kalman Shan DO Entered By: Kalman Shan on 05/04/2021 16:51:03 -------------------------------------------------------------------------------- Debridement Details Patient Name: Date of Service: [name], Rhonda Rhonda G. 05/04/2021 1:45 PM Medical Record Number: [id number] Patient Account Number: [id number] Date of Birth/Sex: Treating RN: 08-29-1935 (86 y.o. [name], [name] Primary Care Provider: Su Monks Other Clinician: Referring Provider: Treating Provider/Extender: Kalman Shan MYA TT, JENNIFER Weeks in Treatment: 10 Debridement Performed for Assessment: Wound #6 Right,Lateral Ankle Performed By: Physician Kalman Shan, DO Debridement Type: Debridement Level of Consciousness (Pre-procedure): Awake and Alert Pre-procedure Verification/Time Out Yes - 16:00 Taken: Start Time: 16:00 Pain Control: Lidocaine T Area Debrided (L x W): otal 0.5 (cm) x 0.8 (cm) = 0.4 (cm) Tissue and other material debrided: Viable, Non-Viable, Slough, Subcutaneous, Skin: Dermis , Skin: Epidermis, Slough Level: Skin/Subcutaneous Tissue Debridement Description: Excisional Instrument: Curette Bleeding: Minimum Hemostasis Achieved: Pressure End Time: 16:00 Procedural Pain: 0 Post Procedural Pain: 0 Response to Treatment: Procedure was tolerated well Level of  Consciousness (Post- Awake and Alert procedure): Post Debridement Measurements of Total Wound Length: (cm) 0.5 Stage: Category/Stage II Width: (cm) 0.8 Depth: (cm) 0.2 Volume: (cm) 0.063 Character of Wound/Ulcer Post Debridement: Improved Post Procedure Diagnosis Same as Pre-procedure Electronic Signature(s) Signed: 05/04/2021 7:48:44 PM By: Rhae Hammock RN Signed: 05/08/2021 4:40:45 PM By: Kalman Shan DO Entered By: Rhae Hammock on 05/04/2021 16:09:18 -------------------------------------------------------------------------------- Physical Exam Details Patient Name: Date of Service: [name], Rhonda Rhonda G. 05/04/2021 1:45 PM Medical Record Number: [id number] Patient Account Number: [id number] Date of Birth/Sex: Treating RN: 09/25/1935 (86 y.o. [name] Primary Care Provider: Su Monks Other Clinician: Referring Provider: Treating Provider/Extender: Kalman Shan MYA TT, JENNIFER Weeks in Treatment: 10 Constitutional respirations regular, non-labored and within target range for patient.. Cardiovascular 2+ dorsalis pedis/posterior tibialis pulses. Psychiatric pleasant and cooperative. Notes Left trochanter: open wound with granulation tissue present and significant undermining circumferentially Left medial lower leg with scab and mostly epithelialization throughout Left plantar foot has a blister formed Left posterior ankle has a wound limited to skin breakdown right lateral ankle has an open wound with slough No obvious signs of infection on exam. Electronic Signature(s) Signed: 05/08/2021 4:40:45 PM By: Kalman Shan DO Entered By: Kalman Shan on 05/08/2021 15:34:02 -------------------------------------------------------------------------------- Physician Orders Details Patient Name: Date of Service: [name], Rhonda Rhonda G. 05/04/2021 1:45 PM Medical Record Number: [id number] Patient Account Number: [id number] Date of Birth/Sex: Treating  RN: 03/09/1935 (86 y.o. [name], [name] Primary Care Provider: Su Monks Other Clinician: Referring Provider: Treating Provider/Extender: Kalman Shan MYA TT, JENNIFER Weeks in Treatment: 10 Verbal / Phone Orders: No Diagnosis Coding ICD-10 Coding Code Description [phone] Pressure ulcer of left hip, stage 3 G30.9 Alzheimer's disease, unspecified I10 Essential (primary) hypertension N18.9 Chronic kidney disease, unspecified S81.802A Unspecified open wound, left lower leg, initial encounter S91.001A Unspecified open wound, right ankle, initial encounter Follow-up Appointments ppointment in 1 week. - Dr. Heber [place] Return A Bathing/ Shower/ Hygiene May shower with protection but do not get wound dressing(s) wet. Off-Loading Gel  mattress overlay (Group 1) Roho cushion for wheelchair Turn and reposition every 2 hours [place] wound care orders this week; continue Home Health for wound care. May utilize formulary equivalent dressing for wound treatment orders unless otherwise specified. Other Home Health Orders/Instructions: - [facility] Wound Treatment Wound #1 - Trochanter Wound Laterality: Left Cleanser: Soap and Water 1 x Per JDB/52 Days Discharge Instructions: May shower and wash wound with dial antibacterial soap and water prior to dressing change. Cleanser: Wound Cleanser (Home Health) (Generic) 1 x Per Day/15 Days Discharge Instructions: Cleanse the wound with wound cleanser or normal saline prior to applying a clean dressing using gauze sponges, not tissue or cotton balls. Peri-Wound Care: Zinc Oxide Ointment 30g tube (Home Health) 1 x Per Day/15 Days Discharge Instructions: Apply Zinc Oxide to excoriated areas Prim Dressing: Dakin's Solution 0.125%, 16 (oz) (Home Health) 1 x Per Day/15 Days ary Discharge Instructions: Moisten gauze with Dakin's solution, lightly packing into undermining. Use Anasept gel in clinic. Secondary Dressing: Bordered Gauze, 4x4  in (Home Health) 1 x Per Day/15 Days Discharge Instructions: Apply over primary dressing as directed. Wound #2 - Lower Leg Wound Laterality: Left, Medial Cleanser: Soap and Water 1 x Per Day/15 Days Discharge Instructions: May shower and wash wound with dial antibacterial soap and water prior to dressing change. Prim Dressing: Triple Antibiotic Ointment, 0.9 (g) packet (Home Health) 1 x Per Day/15 Days ary Discharge Instructions: Apply to wound bed Secondary Dressing: Bordered Gauze, 2x2 in 1 x Per Day/15 Days Discharge Instructions: Apply over primary dressing as directed. Wound #4 - Foot Wound Laterality: Plantar, Left Cleanser: Soap and Water 1 x Per Day/15 Days Discharge Instructions: May shower and wash wound with dial antibacterial soap and water prior to dressing change. Prim Dressing: Triple Antibiotic Ointment, 0.9 (g) packet (Home Health) 1 x Per Day/15 Days ary Discharge Instructions: Apply to wound bed Secondary Dressing: Bordered Gauze, 2x2 in 1 x Per Day/15 Days Discharge Instructions: Apply over primary dressing as directed. Wound #5 - Ankle Wound Laterality: Left, Posterior Cleanser: Soap and Water 1 x Per CEY/22 Days Discharge Instructions: May shower and wash wound with dial antibacterial soap and water prior to dressing change. Prim Dressing: Triple Antibiotic Ointment, 0.9 (g) packet (Home Health) 1 x Per Day/15 Days ary Discharge Instructions: Apply to wound bed Secondary Dressing: Bordered Gauze, 2x2 in 1 x Per Day/15 Days Discharge Instructions: Apply over primary dressing as directed. Wound #6 - Ankle Wound Laterality: Right, Lateral Cleanser: Soap and Water 1 x Per VVK/12 Days Discharge Instructions: May shower and wash wound with dial antibacterial soap and water prior to dressing change. Prim Dressing: Santyl Ointment (Home Health) 1 x Per Day/15 Days ary Discharge Instructions: Apply nickel thick amount to wound bed as instructed Secondary Dressing:  Bordered Gauze, 2x2 in ([place]) 1 x Per Day/15 Days Discharge Instructions: Apply over primary dressing as directed. Electronic Signature(s) Signed: 05/08/2021 4:40:45 PM By: Kalman Shan DO Previous Signature: 05/04/2021 7:48:44 PM Version By: Rhae Hammock RN Entered By: Kalman Shan on 05/08/2021 15:39:37 -------------------------------------------------------------------------------- Problem List Details Patient Name: Date of Service: [name], Rhonda Rhonda G. 05/04/2021 1:45 PM Medical Record Number: [id number] Patient Account Number: [id number] Date of Birth/Sex: Treating RN: [birth year] (86 y.o. [name] Primary Care Provider: Su Monks Other Clinician: Referring Provider: Treating Provider/Extender: Kalman Shan MYA TT, JENNIFER Weeks in Treatment: 10 Active Problems ICD-10 Encounter Code Description Active Date MDM Diagnosis L89.223 Pressure ulcer of left hip, stage 3 02/20/2021  No Yes G30.9 Alzheimer's disease, unspecified 02/20/2021 No Yes I10 Essential (primary) hypertension 02/20/2021 No Yes N18.9 Chronic kidney disease, unspecified 02/20/2021 No Yes S81.802A Unspecified open wound, left lower leg, initial encounter 05/04/2021 No Yes S91.001A Unspecified open wound, right ankle, initial encounter 05/04/2021 No Yes Inactive Problems Resolved Problems Electronic Signature(s) Signed: 05/08/2021 4:40:45 PM By: Kalman Shan DO Entered By: Kalman Shan on 05/08/2021 14:29:26 -------------------------------------------------------------------------------- Progress Note Details Patient Name: Date of Service: [name], Rhonda Rhonda G. 05/04/2021 1:45 PM Medical Record Number: [id number] Patient Account Number: [id number] Date of Birth/Sex: Treating RN: [birth year] (86 y.o. [name] Primary Care Provider: Su Monks Other Clinician: Referring Provider: Treating Provider/Extender: Kalman Shan MYA TT, JENNIFER Weeks in Treatment:  10 Subjective Chief Complaint Information obtained from Patient Left hip wound and Right lateral ankle wound Objective Constitutional respirations regular, non-labored and within target range for patient.. Vitals Time Taken: 2:57 PM, Height: 63 in, Weight: 80 lbs, BMI: 14.2, Temperature: 98.3 F, Pulse: 86 bpm, Respiratory Rate: 16 breaths/min, Blood Pressure: 117/70 mmHg. Cardiovascular 2+ dorsalis pedis/posterior tibialis pulses. Psychiatric pleasant and cooperative. General Notes: Left trochanter: open wound with granulation tissue present and significant undermining circumferentially Left medial lower leg with scab and mostly epithelialization throughout Left plantar foot has a blister formed Left posterior ankle has a wound limited to skin breakdown right lateral ankle has an open wound with slough No obvious signs of infection on exam. Integumentary (Hair, Skin) Wound #1 status is Open. Original cause of wound was Gradually Appeared. The date acquired was: 02/05/2021. The wound has been in treatment 10 weeks. The wound is located on the Left Trochanter. The wound measures 1.2cm length x 0.7cm width x 1cm depth; 0.66cm^2 area and 0.66cm^3 volume. There is Fat Layer (Subcutaneous Tissue) exposed. There is tunneling at 12:00 with a maximum distance of 4.5cm. There is a large amount of serosanguineous drainage noted. The wound margin is well defined and not attached to the wound base. There is large (67-100%) red, pink granulation within the wound bed. There is no necrotic tissue within the wound bed. Wound #2 status is Open. Original cause of wound was Trauma. The date acquired was: 04/21/2021. The wound has been in treatment 1 weeks. The wound is located on the Left,Medial Lower Leg. The wound measures 1.5cm length x 0.5cm width x 0.1cm depth; 0.589cm^2 area and 0.059cm^3 volume. There is Fat Layer (Subcutaneous Tissue) exposed. There is no tunneling or undermining noted. There is a medium  amount of serosanguineous drainage noted. The wound margin is distinct with the outline attached to the wound base. There is large (67-100%) red, pink granulation within the wound bed. There is a small (1-33%) amount of necrotic tissue within the wound bed including Adherent Slough. Wound #3 status is Healed - Epithelialized. Original cause of wound was Trauma. The date acquired was: 04/21/2021. The wound has been in treatment 1 weeks. The wound is located on the Left,Lateral Lower Leg. The wound measures 0cm length x 0cm width x 0cm depth; 0cm^2 area and 0cm^3 volume. Wound #4 status is Open. Original cause of wound was Pressure Injury. The date acquired was: 05/01/2021. The wound is located on the [place]. The wound measures 3cm length x 3cm width x 0.1cm depth; 7.069cm^2 area and 0.707cm^3 volume. There is Fat Layer (Subcutaneous Tissue) exposed. There is a medium amount of serosanguineous drainage noted. The wound margin is distinct with the outline attached to the wound base. There is no granulation within the wound bed.  There is a large (67-100%) amount of necrotic tissue within the wound bed including Eschar. Wound #4 status is Open. Original cause of wound was Pressure Injury. The date acquired was: 05/01/2021. The wound is located on the [place]. The wound measures 3cm length x 3cm width x 0.1cm depth; 7.069cm^2 area and 0.707cm^3 volume. There is Fat Layer (Subcutaneous Tissue) exposed. There is a medium amount of serosanguineous drainage noted. The wound margin is distinct with the outline attached to the wound base. There is no granulation within the wound bed. There is a large (67-100%) amount of necrotic tissue within the wound bed including Eschar. Wound #5 status is Open. Original cause of wound was Blister. The date acquired was: 05/01/2021. The wound is located on the Left,Posterior Ankle. The wound measures 2cm length x 1cm width x 0.1cm depth; 1.571cm^2 area and  0.157cm^3 volume. There is Fat Layer (Subcutaneous Tissue) exposed. There is no tunneling or undermining noted. There is a medium amount of serous drainage noted. The wound margin is distinct with the outline attached to the wound base. There is large (67-100%) red granulation within the wound bed. There is no necrotic tissue within the wound bed. Wound #6 status is Open. Original cause of wound was Pressure Injury. The date acquired was: 05/01/2021. The wound is located on the Right,Lateral Ankle. The wound measures 0.5cm length x 0.8cm width x 0.2cm depth; 0.314cm^2 area and 0.063cm^3 volume. There is Fat Layer (Subcutaneous Tissue) exposed. There is no tunneling or undermining noted. There is a medium amount of serosanguineous drainage noted. The wound margin is distinct with the outline attached to the wound base. There is large (67-100%) red granulation within the wound bed. There is a small (1-33%) amount of necrotic tissue within the wound bed including Adherent Slough. Wound #6 status is Open. Original cause of wound was Pressure Injury. The date acquired was: 05/01/2021. The wound is located on the Right,Lateral Ankle. The wound measures 0.5cm length x 0.8cm width x 0.2cm depth; 0.314cm^2 area and 0.063cm^3 volume. There is Fat Layer (Subcutaneous Tissue) exposed. There is no tunneling or undermining noted. There is a medium amount of serosanguineous drainage noted. The wound margin is distinct with the outline attached to the wound base. There is large (67-100%) red granulation within the wound bed. There is a small (1-33%) amount of necrotic tissue within the wound bed including Adherent Slough. Assessment Active Problems ICD-10 Pressure ulcer of left hip, stage 3 Alzheimer's disease, unspecified Essential (primary) hypertension Chronic kidney disease, unspecified Unspecified open wound, left lower leg, initial encounter Unspecified open wound, right ankle, initial encounter Patient's  left trochanter wound has significant undermining and son reported that he is not using the Dakin's solution to the entire area. He will try and use thinner gauze to get in the area more. I still think this is the best option at this point. There is always concern for osteo since the wound overlies bone. However She will likely not tolerate an MRI due to her dementia. She did have an x-ray several weeks ago that was negative for osteo. She may need to do CT scan if this is not improved. She now has a new right ankle wound that is concerning. I recommended trying to offload this area. I debrided this and I recommended doing Santyl daily. For the rest the wounds I recommended antibiotic ointment. There were no signs of infection on exam. Procedures Wound #6 Pre-procedure diagnosis of Wound #6 is a Pressure Ulcer located on the  Right,Lateral Ankle . There was a Excisional Skin/Subcutaneous Tissue Debridement with a total area of 0.4 sq cm performed by Kalman Shan, DO. With the following instrument(s): Curette to remove Viable and Non-Viable tissue/material. Material removed includes Subcutaneous Tissue, Slough, Skin: Dermis, and Skin: Epidermis after achieving pain control using Lidocaine. No specimens were taken. A time out was conducted at 16:00, prior to the start of the procedure. A Minimum amount of bleeding was controlled with Pressure. The procedure was tolerated well with a pain level of 0 throughout and a pain level of 0 following the procedure. Post Debridement Measurements: 0.5cm length x 0.8cm width x 0.2cm depth; 0.063cm^3 volume. Post debridement Stage noted as Category/Stage II. Character of Wound/Ulcer Post Debridement is improved. Post procedure Diagnosis Wound #6: Same as Pre-Procedure Plan Follow-up Appointments: Return Appointment in 1 week. - Dr. Heber [place] Bathing/ Shower/ Hygiene: May shower with protection but do not get wound dressing(s) wet. Off-Loading: Gel mattress  overlay (Group 1) Roho cushion for wheelchair Turn and reposition every 2 hours Home Health: New wound care orders this week; continue Home Health for wound care. May utilize formulary equivalent dressing for wound treatment orders unless otherwise specified. Other Home Health Orders/Instructions: - [facility] WOUND #1: - Trochanter Wound Laterality: Left Cleanser: Soap and Water 1 x Per MIW/80 Days Discharge Instructions: May shower and wash wound with dial antibacterial soap and water prior to dressing change. Cleanser: Wound Cleanser (Home Health) (Generic) 1 x Per Day/15 Days Discharge Instructions: Cleanse the wound with wound cleanser or normal saline prior to applying a clean dressing using gauze sponges, not tissue or cotton balls. Peri-Wound Care: Zinc Oxide Ointment 30g tube (Home Health) 1 x Per Day/15 Days Discharge Instructions: Apply Zinc Oxide to excoriated areas Prim Dressing: Dakin's Solution 0.125%, 16 (oz) (Home Health) 1 x Per Day/15 Days ary Discharge Instructions: Moisten gauze with Dakin's solution, lightly packing into undermining. Use Anasept gel in clinic. Secondary Dressing: Bordered Gauze, 4x4 in (Home Health) 1 x Per Day/15 Days Discharge Instructions: Apply over primary dressing as directed. WOUND #2: - Lower Leg Wound Laterality: Left, Medial Cleanser: Soap and Water 1 x Per Day/15 Days Discharge Instructions: May shower and wash wound with dial antibacterial soap and water prior to dressing change. Prim Dressing: Triple Antibiotic Ointment, 0.9 (g) packet (Home Health) 1 x Per Day/15 Days ary Discharge Instructions: Apply to wound bed Secondary Dressing: Bordered Gauze, 2x2 in 1 x Per Day/15 Days Discharge Instructions: Apply over primary dressing as directed. WOUND #4: - Foot Wound Laterality: Plantar, Left Cleanser: Soap and Water 1 x Per Day/15 Days Discharge Instructions: May shower and wash wound with dial antibacterial soap and water prior to dressing  change. Prim Dressing: Triple Antibiotic Ointment, 0.9 (g) packet (Home Health) 1 x Per Day/15 Days ary Discharge Instructions: Apply to wound bed Secondary Dressing: Bordered Gauze, 2x2 in 1 x Per Day/15 Days Discharge Instructions: Apply over primary dressing as directed. WOUND #5: - Ankle Wound Laterality: Left, Posterior Cleanser: Soap and Water 1 x Per HOZ/22 Days Discharge Instructions: May shower and wash wound with dial antibacterial soap and water prior to dressing change. Prim Dressing: Triple Antibiotic Ointment, 0.9 (g) packet (Home Health) 1 x Per Day/15 Days ary Discharge Instructions: Apply to wound bed Secondary Dressing: Bordered Gauze, 2x2 in 1 x Per Day/15 Days Discharge Instructions: Apply over primary dressing as directed. WOUND #6: - Ankle Wound Laterality: Right, Lateral Cleanser: Soap and Water 1 x Per QMG/50 Days Discharge Instructions: May shower and  wash wound with dial antibacterial soap and water prior to dressing change. Prim Dressing: Santyl Ointment (Home Health) 1 x Per Day/15 Days ary Discharge Instructions: Apply nickel thick amount to wound bed as instructed Secondary Dressing: Bordered Gauze, 2x2 in ([place]) 1 x Per Day/15 Days Discharge Instructions: Apply over primary dressing as directed. 1. In office sharp debridement 2. Santyl to the right ankle wound 3. Dakin's wet-to-dry to the left trochanter 4. Antibiotic ointment to the rest of the wounds 5. Follow-up in 1 week Electronic Signature(s) Signed: 05/08/2021 4:40:45 PM By: Kalman Shan DO Entered By: Kalman Shan on 05/08/2021 15:42:48 -------------------------------------------------------------------------------- SuperBill Details Patient Name: Date of Service: [name], Rhonda Rhonda G. 05/04/2021 Medical Record Number: [id number] Patient Account Number: [id number] Date of Birth/Sex: Treating RN: Oct 23, 1934 (86 y.o. [name], [name] Primary Care Provider: Su Monks Other  Clinician: Referring Provider: Treating Provider/Extender: Kalman Shan MYA TT, JENNIFER Weeks in Treatment: 10 Diagnosis Coding ICD-10 Codes Code Description [phone] Pressure ulcer of left hip, stage 3 G30.9 Alzheimer's disease, unspecified I10 Essential (primary) hypertension N18.9 Chronic kidney disease, unspecified S81.802A Unspecified open wound, left lower leg, initial encounter S91.001A Unspecified open wound, right ankle, initial encounter Facility Procedures CPT4 Code: [id number] Description: 03754 - DEB SUBQ TISSUE 20 SQ CM/< ICD-10 Diagnosis Description L89.223 Pressure ulcer of left hip, stage 3 Modifier: Quantity: 1 Physician Procedures : CPT4 Code Description Modifier [id number] - WC PHYS LEVEL 3 - EST PT ICD-10 Diagnosis Description S81.802A Unspecified open wound, left lower leg, initial encounter S91.001A Unspecified open wound, right ankle, initial encounter L89.223 Pressure  ulcer of left hip, stage 3 Quantity: 1 : [id number] 11042 - WC PHYS SUBQ TISS 20 SQ CM ICD-10 Diagnosis Description L89.223 Pressure ulcer of left hip, stage 3 Quantity: 1 Electronic Signature(s) Signed: 05/08/2021 4:40:45 PM By: Kalman Shan DO Previous Signature: 05/04/2021 7:48:44 PM Version By: Rhae Hammock RN Entered By: Kalman Shan on 05/08/2021 15:45:06

## 2021-05-08 NOTE — Progress Notes (Signed)
DAMANI, RANDO ([id number]) Visit Report for 05/04/2021 Arrival Information Details Patient Name: Date of Service: [name], [place] [name] 05/04/2021 1:45 PM Medical Record Number: [id number] Patient Account Number: [id number] Date of Birth/Sex: Treating RN: 11/27/1934 (86 y.o. [name] Primary Care Carleigh Buccieri: Su Monks Other Clinician: Referring Talina Pleitez: Treating Mekiyah Gladwell/Extender: Kalman Shan MYA TT, JENNIFER Weeks in Treatment: 10 Visit Information History Since Last Visit Added or deleted any medications: No Patient Arrived: Wheel Chair Any new allergies or adverse reactions: No Arrival Time: 14:53 Had a fall or experienced change in No Accompanied By: son activities of daily living that may affect Transfer Assistance: Manual risk of falls: Patient Identification Verified: Yes Signs or symptoms of abuse/neglect since last visito No Secondary Verification Process Completed: Yes Hospitalized since last visit: No Patient Requires Transmission-Based Precautions: No Implantable device outside of the clinic excluding No Patient Has Alerts: No cellular tissue based products placed in the center since last visit: Has Dressing in Place as Prescribed: Yes Pain Present Now: No Electronic Signature(s) Signed: 05/04/2021 6:43:27 PM By: Lorrin Jackson Entered By: Lorrin Jackson on 05/04/2021 14:57:45 -------------------------------------------------------------------------------- Encounter Discharge Information Details Patient Name: Date of Service: [name], [name] 05/04/2021 1:45 PM Medical Record Number: [id number] Patient Account Number: [id number] Date of Birth/Sex: Treating RN: September 20, 1935 (86 y.o. [name] Primary Care Akshay Spang: Su Monks Other Clinician: Referring Jeanclaude Wentworth: Treating Daniyla Pfahler/Extender: Kalman Shan MYA TT, JENNIFER Weeks in Treatment: 10 Encounter Discharge Information Items Post Procedure Vitals Discharge Condition:  Stable Temperature (F): 98.3 Ambulatory Status: Wheelchair Pulse (bpm): 86 Discharge Destination: Home Respiratory Rate (breaths/min): 16 Transportation: Private Auto Blood Pressure (mmHg): 117/70 Schedule Follow-up Appointment: Yes Clinical Summary of Care: Provided on 05/04/2021 Form Type Recipient Paper Patient Patient/Caregiver Electronic Signature(s) Signed: 05/04/2021 4:48:54 PM By: Lorrin Jackson Entered By: Lorrin Jackson on 05/04/2021 16:48:54 -------------------------------------------------------------------------------- Lower Extremity Assessment Details Patient Name: Date of Service: [name], [name] 05/04/2021 1:45 PM Medical Record Number: [id number] Patient Account Number: [id number] Date of Birth/Sex: Treating RN: [birth year] (85 y.o. [name] Primary Care Debi Cousin: Su Monks Other Clinician: Referring Declan Mier: Treating Chery Giusto/Extender: Kalman Shan MYA TT, JENNIFER Weeks in Treatment: 10 Edema Assessment Assessed: [Left: Yes] [Right: No] Edema: [Left: N] [Right: o] Calf Left: Right: Point of Measurement: From Medial Instep 20 cm Ankle Left: Right: Point of Measurement: From Medial Instep 17 cm Vascular Assessment Pulses: Dorsalis Pedis Palpable: [Left:Yes] Electronic Signature(s) Signed: 05/04/2021 6:43:27 PM By: Lorrin Jackson Entered By: Lorrin Jackson on 05/04/2021 14:59:07 -------------------------------------------------------------------------------- Multi Wound Chart Details Patient Name: Date of Service: [name], [name] 05/04/2021 1:45 PM Medical Record Number: [id number] Patient Account Number: [id number] Date of Birth/Sex: Treating RN: March 27, 1935 (86 y.o. [name], [name] Primary Care Analyse Angst: Su Monks Other Clinician: Referring Reneshia Zuccaro: Treating Aspasia Rude/Extender: Kalman Shan MYA TT, JENNIFER Weeks in Treatment: 10 Vital Signs Height(in): 63 Pulse(bpm): 86 Weight(lbs): 80 Blood  Pressure(mmHg): 117/70 Body Mass Index(BMI): 14 Temperature(F): 98.3 Respiratory Rate(breaths/min): 16 Photos: [1:Left Trochanter] [2:Left, Medial Lower Leg] [3:No Photos Left, Lateral Lower Leg] Wound Location: [1:Gradually Appeared] [2:Trauma] [3:Trauma] Wounding Event: [1:Pressure Ulcer] [2:Trauma, Other] [3:Trauma, Other] Primary Etiology: [1:Hypertension, Myocardial Infarction,] [2:Hypertension, Myocardial Infarction,] [3:Hypertension, Myocardial Infarction,] Comorbid History: [1:Crohns, Dementia 02/05/2021] [2:Crohns, Dementia 04/21/2021] [3:Crohns, Dementia 04/21/2021] Date Acquired: [1:10] [2:1] [3:1] Weeks of Treatment: [1:Open] [2:Open] [3:Healed - Epithelialized] Wound Status: [1:1.2x0.7x1] [2:1.5x0.5x0.1] [3:0x0x0] Measurements L x W x D (cm) [1:0.66] [2:0.589] [3:0] A (cm) : rea [1:0.66] [2:0.059] [3:0] Volume (cm) : [1:94.40%] [2:62.50%] [  3:100.00%] % Reduction in A rea: [1:97.20%] [2:62.40%] [3:100.00%] % Reduction in Volume: [1:12] Position 1 (o'clock): [1:4.5] Maximum Distance 1 (cm): [1:Yes] [2:No] [3:N/A] Tunneling: [1:Category/Stage III] [2:Full Thickness Without Exposed] [3:Full Thickness Without Exposed] Classification: [1:Large] [2:Support Structures Medium] [3:Support Structures N/A] Exudate A mount: [1:Serosanguineous] [2:Serosanguineous] [3:N/A] Exudate Type: [1:red, brown] [2:red, brown] [3:N/A] Exudate Color: [1:Well defined, not attached] [2:Distinct, outline attached] [3:N/A] Wound Margin: [1:Large (67-100%)] [2:Large (67-100%)] [3:N/A] Granulation A mount: [1:Red, Pink] [2:Red, Pink] [3:N/A] Granulation Quality: [1:None Present (0%)] [2:Small (1-33%)] [3:N/A] Necrotic A mount: [1:N/A] [2:Adherent Slough] [3:N/A] Necrotic Tissue: [1:Fat Layer (Subcutaneous Tissue): Yes Fat Layer (Subcutaneous Tissue): Yes N/A] Exposed Structures: [1:Fascia: No Tendon: No Muscle: No Joint: No Bone: No Large (67-100%)] [2:Fascia: No Tendon: No Muscle: No Joint: No Bone: No  None] [3:N/A] Epithelialization: [1:N/A] [2:N/A] [3:N/A] Debridement: [1:N/A] [2:N/A] [3:N/A] Pain Control: [1:N/A] [2:N/A] [3:N/A] Tissue Debrided: [1:N/A] [2:N/A] [3:N/A] Level: [1:N/A] [2:N/A] [3:N/A] Debridement A (sq cm): [1:rea N/A] [2:N/A] [3:N/A] Instrument: [1:N/A] [2:N/A] [3:N/A] Bleeding: [1:N/A] [2:N/A] [3:N/A] Hemostasis A chieved: [1:N/A] [2:N/A] [3:N/A] Procedural Pain: [1:N/A] [2:N/A] [3:N/A] Post Procedural Pain: Debridement Treatment Response: N/A [2:N/A] [3:N/A] Post Debridement Measurements L x N/A [2:N/A] [3:N/A] W x D (cm) [1:N/A] [2:N/A] [3:N/A] Post Debridement Volume: (cm) [1:N/A] [2:N/A] [3:N/A] Post Debridement Stage: [1:N/A] [2:N/A] [3:N/A] Wound Number: 4 4 5  Photos: No Photos Left, Plantar Foot Left, Plantar Foot Left, Posterior Ankle Wound Location: Pressure Injury Pressure Injury Blister Wounding Event: Pressure Ulcer Pressure Ulcer Venous Leg Ulcer Primary Etiology: Hypertension, Myocardial Infarction, Hypertension, Myocardial Infarction, Hypertension, Myocardial Infarction, Comorbid History: Crohns, Dementia Crohns, Dementia Crohns, Dementia 05/01/2021 05/01/2021 05/01/2021 Date Acquired: 0 0 0 Weeks of Treatment: Open Open Open Wound Status: 3x3x0.1 3x3x0.1 2x1x0.1 Measurements L x W x D (cm) 7.069 7.069 1.571 A (cm) : rea 0.707 0.707 0.157 Volume (cm) : 0.00% 0.00% 0.00% % Reduction in Area: 0.00% 0.00% 0.00% % Reduction in Volume: N/A N/A No Tunneling: Category/Stage II Category/Stage II Full Thickness Without Exposed Classification: Support Structures Medium Medium Medium Exudate Amount: Serosanguineous Serosanguineous Serous Exudate Type: red, brown red, brown amber Exudate Color: Distinct, outline attached Distinct, outline attached Distinct, outline attached Wound Margin: None Present (0%) None Present (0%) Large (67-100%) Granulation Amount: N/A N/A Red Granulation Quality: Large (67-100%) Large (67-100%) None  Present (0%) Necrotic Amount: Eschar Eschar N/A Necrotic Tissue: Fat Layer (Subcutaneous Tissue): Yes Fat Layer (Subcutaneous Tissue): Yes Fat Layer (Subcutaneous Tissue): Yes Exposed Structures: Fascia: No Fascia: No Fascia: No Tendon: No Tendon: No Tendon: No Muscle: No Muscle: No Muscle: No Joint: No Joint: No Joint: No Bone: No Bone: No Bone: No None None None Epithelialization: N/A N/A N/A Debridement: N/A N/A N/A Pain Control: N/A N/A N/A Tissue Debrided: N/A N/A N/A Level: N/A N/A N/A Debridement A (sq cm): rea N/A N/A N/A Instrument: N/A N/A N/A Bleeding: N/A N/A N/A Hemostasis A chieved: N/A N/A N/A Procedural Pain: N/A N/A N/A Post Procedural Pain: Debridement Treatment Response: N/A N/A N/A Post Debridement Measurements L x N/A N/A N/A W x D (cm) N/A N/A N/A Post Debridement Volume: (cm) N/A N/A N/A Post Debridement Stage: N/A N/A N/A Procedures Performed: Wound Number: 6 6 N/A Photos: No Photos N/A Right, Lateral Ankle Right, Lateral Ankle N/A Wound Location: Pressure Injury Pressure Injury N/A Wounding Event: Pressure Ulcer Pressure Ulcer N/A Primary Etiology: Hypertension, Myocardial Infarction, Hypertension, Myocardial Infarction, N/A Comorbid History: Crohns, Dementia Crohns, Dementia 05/01/2021 05/01/2021 N/A Date Acquired: 0 0 N/A Weeks of Treatment: Open Open N/A Wound Status:  0.5x0.8x0.2 0.5x0.8x0.2 N/A Measurements L x W x D (cm) 0.314 0.314 N/A A (cm) : rea 0.063 0.063 N/A Volume (cm) : 0.00% 0.00% N/A % Reduction in A rea: 0.00% 0.00% N/A % Reduction in Volume: No No N/A Tunneling: Category/Stage II Category/Stage II N/A Classification: Medium Medium N/A Exudate A mount: Serosanguineous Serosanguineous N/A Exudate Type: red, brown red, brown N/A Exudate Color: Distinct, outline attached Distinct, outline attached N/A Wound Margin: Large (67-100%) Large (67-100%) N/A Granulation A mount: Red Red  N/A Granulation Quality: Small (1-33%) Small (1-33%) N/A Necrotic A mount: Adherent Slough Adherent Slough N/A Necrotic Tissue: Fat Layer (Subcutaneous Tissue): Yes Fat Layer (Subcutaneous Tissue): Yes N/A Exposed Structures: Fascia: No Fascia: No Tendon: No Tendon: No Muscle: No Muscle: No Joint: No Joint: No Bone: No Bone: No None None N/A Epithelialization: Debridement - Excisional Debridement - Excisional N/A Debridement: Pre-procedure Verification/Time Out 16:00 16:00 N/A Taken: Lidocaine Lidocaine N/A Pain Control: Subcutaneous, Slough Subcutaneous, Slough N/A Tissue Debrided: Skin/Subcutaneous Tissue Skin/Subcutaneous Tissue N/A Level: 0.4 0.4 N/A Debridement A (sq cm): rea Curette Curette N/A Instrument: Minimum Minimum N/A Bleeding: Pressure Pressure N/A Hemostasis A chieved: 0 0 N/A Procedural Pain: 0 0 N/A Post Procedural Pain: Procedure was tolerated well Procedure was tolerated well N/A Debridement Treatment Response: 0.5x0.8x0.2 0.5x0.8x0.2 N/A Post Debridement Measurements L x W x D (cm) 0.063 0.063 N/A Post Debridement Volume: (cm) Category/Stage II Category/Stage II N/A Post Debridement Stage: Debridement Debridement N/A Procedures Performed: Treatment Notes Wound #1 (Trochanter) Wound Laterality: Left Cleanser Soap and Water Discharge Instruction: May shower and wash wound with dial antibacterial soap and water prior to dressing change. Wound Cleanser Discharge Instruction: Cleanse the wound with wound cleanser or normal saline prior to applying a clean dressing using gauze sponges, not tissue or cotton balls. Peri-Wound Care Zinc Oxide Ointment 30g tube Discharge Instruction: Apply Zinc Oxide to excoriated areas Topical Primary Dressing Dakin's Solution 0.125%, 16 (oz) Discharge Instruction: Moisten gauze with Dakin's solution, lightly packing into undermining. Use Anasept gel in clinic. Secondary Dressing Bordered Gauze, 4x4  in Discharge Instruction: Apply over primary dressing as directed. Secured With Compression Wrap Compression Stockings Add-Ons Wound #2 (Lower Leg) Wound Laterality: Left, Medial Cleanser Soap and Water Discharge Instruction: May shower and wash wound with dial antibacterial soap and water prior to dressing change. Peri-Wound Care Topical Primary Dressing Triple Antibiotic Ointment, 0.9 (g) packet Discharge Instruction: Apply to wound bed Secondary Dressing Bordered Gauze, 2x2 in Discharge Instruction: Apply over primary dressing as directed. Secured With Compression Wrap Compression Stockings Add-Ons Wound #4 (Foot) Wound Laterality: Plantar, Left Cleanser Soap and Water Discharge Instruction: May shower and wash wound with dial antibacterial soap and water prior to dressing change. Peri-Wound Care Topical Primary Dressing Triple Antibiotic Ointment, 0.9 (g) packet Discharge Instruction: Apply to wound bed Secondary Dressing Bordered Gauze, 2x2 in Discharge Instruction: Apply over primary dressing as directed. Secured With Compression Wrap Compression Stockings Add-Ons Wound #5 (Ankle) Wound Laterality: Left, Posterior Cleanser Soap and Water Discharge Instruction: May shower and wash wound with dial antibacterial soap and water prior to dressing change. Peri-Wound Care Topical Primary Dressing Triple Antibiotic Ointment, 0.9 (g) packet Discharge Instruction: Apply to wound bed Secondary Dressing Bordered Gauze, 2x2 in Discharge Instruction: Apply over primary dressing as directed. Secured With Compression Wrap Compression Stockings Add-Ons Wound #6 (Ankle) Wound Laterality: Right, Lateral Cleanser Soap and Water Discharge Instruction: May shower and wash wound with dial antibacterial soap and water prior to dressing change. Peri-Wound Care Topical Primary Dressing  Santyl Ointment Discharge Instruction: Apply nickel thick amount to wound bed as  instructed Secondary Dressing Bordered Gauze, 2x2 in Discharge Instruction: Apply over primary dressing as directed. Secured With Compression Wrap Compression Stockings Environmental education officer) Signed: 05/04/2021 7:48:44 PM By: Rhae Hammock RN Signed: 05/08/2021 4:40:45 PM By: Kalman Shan DO Entered By: Kalman Shan on 05/04/2021 16:50:00 -------------------------------------------------------------------------------- Multi-Disciplinary Care Plan Details Patient Name: Date of Service: [name], [name] 05/04/2021 1:45 PM Medical Record Number: [id number] Patient Account Number: [id number] Date of Birth/Sex: Treating RN: [birth year] (86 y.o. [name] Primary Care Mitchel Delduca: Su Monks Other Clinician: Referring Giovana Faciane: Treating Deundre Thong/Extender: Kalman Shan MYA TT, JENNIFER Weeks in Treatment: Reyno reviewed with physician Active Inactive Pressure Nursing Diagnoses: Knowledge deficit related to causes and risk factors for pressure ulcer development Knowledge deficit related to management of pressures ulcers Potential for impaired tissue integrity related to pressure, friction, moisture, and shear Goals: Patient/caregiver will verbalize risk factors for pressure ulcer development Date Initiated: 02/20/2021 Target Resolution Date: 05/12/2021 Goal Status: Active Patient/caregiver will verbalize understanding of pressure ulcer management Date Initiated: 02/20/2021 Date Inactivated: 03/23/2021 Target Resolution Date: 03/24/2021 Goal Status: Met Interventions: Assess: immobility, friction, shearing, incontinence upon admission and as needed Assess offloading mechanisms upon admission and as needed Assess potential for pressure ulcer upon admission and as needed Provide education on pressure ulcers Notes: Wound/Skin Impairment Nursing Diagnoses: Impaired tissue integrity Knowledge deficit related to  ulceration/compromised skin integrity Goals: Patient/caregiver will verbalize understanding of skin care regimen Date Initiated: 02/20/2021 Target Resolution Date: 05/12/2021 Goal Status: Active Ulcer/skin breakdown will have a volume reduction of 30% by week 4 Date Initiated: 02/20/2021 Date Inactivated: 03/23/2021 Target Resolution Date: 03/24/2021 Unmet Reason: deeper according to Goal Status: Unmet wound care measurements. Interventions: Assess patient/caregiver ability to obtain necessary supplies Assess patient/caregiver ability to perform ulcer/skin care regimen upon admission and as needed Assess ulceration(s) every visit Provide education on ulcer and skin care Notes: Electronic Signature(s) Signed: 05/04/2021 7:48:44 PM By: Rhae Hammock RN Entered By: Rhae Hammock on 05/04/2021 16:12:30 -------------------------------------------------------------------------------- Pain Assessment Details Patient Name: Date of Service: [name], [name] 05/04/2021 1:45 PM Medical Record Number: [id number] Patient Account Number: [id number] Date of Birth/Sex: Treating RN: 1935/05/14 (86 y.o. [name] Primary Care Kaleel Schmieder: Su Monks Other Clinician: Referring Tauni Sanks: Treating Mele Sylvester/Extender: Kalman Shan MYA TT, JENNIFER Weeks in Treatment: 10 Active Problems Location of Pain Severity and Description of Pain Patient Has Paino No Site Locations Pain Management and Medication Current Pain Management: Electronic Signature(s) Signed: 05/04/2021 6:43:27 PM By: Lorrin Jackson Entered By: Lorrin Jackson on 05/04/2021 14:58:31 -------------------------------------------------------------------------------- Patient/Caregiver Education Details Patient Name: Date of Service: [place] Shelbie Proctor DIE Darnell Level 7/21/2022andnbsp1:45 PM Medical Record Number: [id number] Patient Account Number: [id number] Date of Birth/Gender: Treating RN: 1934/10/31 (85 y.o. [name] Primary Care Physician: Su Monks Other Clinician: Referring Physician: Treating Physician/Extender: Kalman Shan MYA TT, JENNIFER Weeks in Treatment: 10 Education Assessment Education Provided To: Patient Education Topics Provided Pressure: Methods: Explain/Verbal Responses: State content correctly Wound/Skin Impairment: Electronic Signature(s) Signed: 05/04/2021 7:48:44 PM By: Rhae Hammock RN Entered By: Rhae Hammock on 05/04/2021 16:12:58 -------------------------------------------------------------------------------- Wound Assessment Details Patient Name: Date of Service: [name], [name] 05/04/2021 1:45 PM Medical Record Number: [id number] Patient Account Number: [id number] Date of Birth/Sex: Treating RN: Feb 19, 1935 (86 y.o. [name] Primary Care Melaina Howerton: Su Monks Other Clinician: Referring Zeyna Mkrtchyan: Treating Dasan Hardman/Extender: Kalman Shan MYA TT, JENNIFER Weeks in Treatment: 10 Wound  Status Wound Number: 1 Primary Etiology: Pressure Ulcer Wound Location: Left Trochanter Wound Status: Open Wounding Event: Gradually Appeared Comorbid History: Hypertension, Myocardial Infarction, Crohns, Dementia Date Acquired: 02/05/2021 Weeks Of Treatment: 10 Clustered Wound: No Photos Wound Measurements Length: (cm) 1.2 Width: (cm) 0.7 Depth: (cm) 1 Area: (cm) 0.66 Volume: (cm) 0.66 % Reduction in Area: 94.4% % Reduction in Volume: 97.2% Epithelialization: Large (67-100%) Tunneling: Yes Position (o'clock): 12 Maximum Distance: (cm) 4.5 Wound Description Classification: Category/Stage III Wound Margin: Well defined, not attached Exudate Amount: Large Exudate Type: Serosanguineous Exudate Color: red, brown Foul Odor After Cleansing: No Slough/Fibrino No Wound Bed Granulation Amount: Large (67-100%) Exposed Structure Granulation Quality: Red, Pink Fascia Exposed: No Necrotic Amount: None Present (0%) Fat Layer  (Subcutaneous Tissue) Exposed: Yes Tendon Exposed: No Muscle Exposed: No Joint Exposed: No Bone Exposed: No Treatment Notes Wound #1 (Trochanter) Wound Laterality: Left Cleanser Soap and Water Discharge Instruction: May shower and wash wound with dial antibacterial soap and water prior to dressing change. Wound Cleanser Discharge Instruction: Cleanse the wound with wound cleanser or normal saline prior to applying a clean dressing using gauze sponges, not tissue or cotton balls. Peri-Wound Care Zinc Oxide Ointment 30g tube Discharge Instruction: Apply Zinc Oxide to excoriated areas Topical Primary Dressing Dakin's Solution 0.125%, 16 (oz) Discharge Instruction: Moisten gauze with Dakin's solution, lightly packing into undermining. Use Anasept gel in clinic. Secondary Dressing Bordered Gauze, 4x4 in Discharge Instruction: Apply over primary dressing as directed. Secured With Compression Wrap Compression Stockings Environmental education officer) Signed: 05/04/2021 6:43:27 PM By: Lorrin Jackson Signed: 05/05/2021 10:49:45 AM By: Sandre Kitty Entered By: Sandre Kitty on 05/04/2021 15:30:02 -------------------------------------------------------------------------------- Wound Assessment Details Patient Name: Date of Service: [name], [name] 05/04/2021 1:45 PM Medical Record Number: [id number] Patient Account Number: [id number] Date of Birth/Sex: Treating RN: Dec 27, 1934 (86 y.o. [name] Primary Care Melesa Lecy: Su Monks Other Clinician: Referring Aynsley Fleet: Treating Clella Mckeel/Extender: Kalman Shan MYA TT, JENNIFER Weeks in Treatment: 10 Wound Status Wound Number: 2 Primary Etiology: Trauma, Other Wound Location: Left, Medial Lower Leg Wound Status: Open Wounding Event: Trauma Comorbid History: Hypertension, Myocardial Infarction, Crohns, Dementia Date Acquired: 04/21/2021 Weeks Of Treatment: 1 Clustered Wound: No Photos Wound  Measurements Length: (cm) 1.5 Width: (cm) 0.5 Depth: (cm) 0.1 Area: (cm) 0.589 Volume: (cm) 0.059 % Reduction in Area: 62.5% % Reduction in Volume: 62.4% Epithelialization: None Tunneling: No Undermining: No Wound Description Classification: Full Thickness Without Exposed Support Structures Wound Margin: Distinct, outline attached Exudate Amount: Medium Exudate Type: Serosanguineous Exudate Color: red, brown Foul Odor After Cleansing: No Slough/Fibrino Yes Wound Bed Granulation Amount: Large (67-100%) Exposed Structure Granulation Quality: Red, Pink Fascia Exposed: No Necrotic Amount: Small (1-33%) Fat Layer (Subcutaneous Tissue) Exposed: Yes Necrotic Quality: Adherent Slough Tendon Exposed: No Muscle Exposed: No Joint Exposed: No Bone Exposed: No Treatment Notes Wound #2 (Lower Leg) Wound Laterality: Left, Medial Cleanser Soap and Water Discharge Instruction: May shower and wash wound with dial antibacterial soap and water prior to dressing change. Peri-Wound Care Topical Primary Dressing Triple Antibiotic Ointment, 0.9 (g) packet Discharge Instruction: Apply to wound bed Secondary Dressing Bordered Gauze, 2x2 in Discharge Instruction: Apply over primary dressing as directed. Secured With Compression Wrap Compression Stockings Add-Ons Electronic Signature(s) Signed: 05/04/2021 6:43:27 PM By: Lorrin Jackson Signed: 05/05/2021 10:49:45 AM By: Sandre Kitty Entered By: Sandre Kitty on 05/04/2021 15:31:37 -------------------------------------------------------------------------------- Wound Assessment Details Patient Name: Date of Service: [name], [name] 05/04/2021 1:45 PM Medical Record Number: [id number] Patient Account Number:  [id number] Date of Birth/Sex: Treating RN: 03-26-1935 (86 y.o. [name] Primary Care Rodman Recupero: Su Monks Other Clinician: Referring Kavaughn Faucett: Treating Bari Handshoe/Extender: Kalman Shan MYA TT,  JENNIFER Weeks in Treatment: 10 Wound Status Wound Number: 3 Primary Etiology: Trauma, Other Wound Location: Left, Lateral Lower Leg Wound Status: Healed - Epithelialized Wounding Event: Trauma Comorbid History: Hypertension, Myocardial Infarction, Crohns, Dementia Date Acquired: 04/21/2021 Weeks Of Treatment: 1 Clustered Wound: No Wound Measurements Length: (cm) Width: (cm) Depth: (cm) Area: (cm) Volume: (cm) 0 % Reduction in Area: 100% 0 % Reduction in Volume: 100% 0 0 0 Wound Description Classification: Full Thickness Without Exposed Support Structur Electronic Signature(s) Signed: 05/04/2021 6:43:27 PM By: Lorrin Jackson Entered By: Roque Lias By: Lorrin Jackson on 05/04/2021 15:14:41 -------------------------------------------------------------------------------- Wound Assessment Details Patient Name: Date of Service: [name], [name] 05/04/2021 1:45 PM Medical Record Number: [id number] Patient Account Number: [id number] Date of Birth/Sex: Treating RN: 05-30-1935 (86 y.o. [name] Primary Care Avarae Zwart: Su Monks Other Clinician: Referring Nicholl Onstott: Treating Andraya Frigon/Extender: Kalman Shan MYA TT, JENNIFER Weeks in Treatment: 10 Wound Status Wound Number: 4 Primary Etiology: Pressure Ulcer Wound Location: Left, Plantar Foot Wound Status: Open Wounding Event: Pressure Injury Comorbid History: Hypertension, Myocardial Infarction, Crohns, Dementia Date Acquired: 05/01/2021 Weeks Of Treatment: 0 Clustered Wound: No Wound Measurements Length: (cm) 3 Width: (cm) 3 Depth: (cm) 0.1 Area: (cm) 7.069 Volume: (cm) 0.707 % Reduction in Area: 0% % Reduction in Volume: 0% Epithelialization: None Wound Description Classification: Category/Stage II Wound Margin: Distinct, outline attached Exudate Amount: Medium Exudate Type: Serosanguineous Exudate Color: red, brown Foul Odor After Cleansing: No Slough/Fibrino No Wound Bed Granulation  Amount: None Present (0%) Exposed Structure Necrotic Amount: Large (67-100%) Fascia Exposed: No Necrotic Quality: Eschar Fat Layer (Subcutaneous Tissue) Exposed: Yes Tendon Exposed: No Muscle Exposed: No Joint Exposed: No Bone Exposed: No Electronic Signature(s) Signed: 05/04/2021 6:43:27 PM By: Lorrin Jackson Signed: 05/05/2021 10:49:45 AM By: Sandre Kitty Entered By: Sandre Kitty on 05/04/2021 15:34:34 -------------------------------------------------------------------------------- Wound Assessment Details Patient Name: Date of Service: [name], [name] 05/04/2021 1:45 PM Medical Record Number: [id number] Patient Account Number: [id number] Date of Birth/Sex: Treating RN: 1935/07/27 (86 y.o. [name] Primary Care Jaylenn Altier: Su Monks Other Clinician: Referring Pavan Bring: Treating Dashanae Longfield/Extender: Kalman Shan MYA TT, JENNIFER Weeks in Treatment: 10 Wound Status Wound Number: 4 Primary Etiology: Pressure Ulcer Wound Location: Left, Plantar Foot Wound Status: Open Wounding Event: Pressure Injury Comorbid History: Hypertension, Myocardial Infarction, Crohns, Dementia Date Acquired: 05/01/2021 Weeks Of Treatment: 0 Clustered Wound: No Photos Wound Measurements Length: (cm) 3 Width: (cm) 3 Depth: (cm) 0.1 Area: (cm) 7.069 Volume: (cm) 0.707 % Reduction in Area: 0% % Reduction in Volume: 0% Epithelialization: None Wound Description Classification: Category/Stage II Wound Margin: Distinct, outline attached Exudate Amount: Medium Exudate Type: Serosanguineous Exudate Color: red, brown Foul Odor After Cleansing: No Slough/Fibrino No Wound Bed Granulation Amount: None Present (0%) Exposed Structure Necrotic Amount: Large (67-100%) Fascia Exposed: No Necrotic Quality: Eschar Fat Layer (Subcutaneous Tissue) Exposed: Yes Tendon Exposed: No Muscle Exposed: No Joint Exposed: No Bone Exposed: No Treatment Notes Wound #4 (Foot) Wound  Laterality: Plantar, Left Cleanser Soap and Water Discharge Instruction: May shower and wash wound with dial antibacterial soap and water prior to dressing change. Peri-Wound Care Topical Primary Dressing Triple Antibiotic Ointment, 0.9 (g) packet Discharge Instruction: Apply to wound bed Secondary Dressing Bordered Gauze, 2x2 in Discharge Instruction: Apply over primary dressing as directed. Secured With Compression Wrap Compression Stockings Add-Ons Electronic  Signature(s) Signed: 05/04/2021 6:43:27 PM By: Lorrin Jackson Signed: 05/05/2021 10:49:45 AM By: Sandre Kitty Entered By: Sandre Kitty on 05/04/2021 15:34:34 -------------------------------------------------------------------------------- Wound Assessment Details Patient Name: Date of Service: [name], [name] 05/04/2021 1:45 PM Medical Record Number: [id number] Patient Account Number: [id number] Date of Birth/Sex: Treating RN: 1935-05-31 (86 y.o. [name] Primary Care Baylee Mccorkel: Su Monks Other Clinician: Referring Hoyt Leanos: Treating Umer Harig/Extender: Kalman Shan MYA TT, JENNIFER Weeks in Treatment: 10 Wound Status Wound Number: 5 Primary Etiology: Venous Leg Ulcer Wound Location: Left, Posterior Ankle Wound Status: Open Wounding Event: Blister Comorbid History: Hypertension, Myocardial Infarction, Crohns, Dementia Date Acquired: 05/01/2021 Weeks Of Treatment: 0 Clustered Wound: No Photos Wound Measurements Length: (cm) 2 Width: (cm) 1 Depth: (cm) 0.1 Area: (cm) 1.571 Volume: (cm) 0.157 % Reduction in Area: 0% % Reduction in Volume: 0% Epithelialization: None Tunneling: No Undermining: No Wound Description Classification: Full Thickness Without Exposed Support Structures Wound Margin: Distinct, outline attached Exudate Amount: Medium Exudate Type: Serous Exudate Color: amber Foul Odor After Cleansing: No Slough/Fibrino No Wound Bed Granulation Amount: Large  (67-100%) Exposed Structure Granulation Quality: Red Fascia Exposed: No Necrotic Amount: None Present (0%) Fat Layer (Subcutaneous Tissue) Exposed: Yes Tendon Exposed: No Muscle Exposed: No Joint Exposed: No Bone Exposed: No Treatment Notes Wound #5 (Ankle) Wound Laterality: Left, Posterior Cleanser Soap and Water Discharge Instruction: May shower and wash wound with dial antibacterial soap and water prior to dressing change. Peri-Wound Care Topical Primary Dressing Triple Antibiotic Ointment, 0.9 (g) packet Discharge Instruction: Apply to wound bed Secondary Dressing Bordered Gauze, 2x2 in Discharge Instruction: Apply over primary dressing as directed. Secured With Compression Wrap Compression Stockings Environmental education officer) Signed: 05/04/2021 6:43:27 PM By: Lorrin Jackson Signed: 05/05/2021 10:49:45 AM By: Sandre Kitty Entered By: Sandre Kitty on 05/04/2021 15:39:43 -------------------------------------------------------------------------------- Wound Assessment Details Patient Name: Date of Service: [name], [name] 05/04/2021 1:45 PM Medical Record Number: [id number] Patient Account Number: [id number] Date of Birth/Sex: Treating RN: Aug 08, 1935 (86 y.o. [name] Primary Care Damarea Merkel: Su Monks Other Clinician: Referring Treesa Mccully: Treating Taisa Deloria/Extender: Kalman Shan MYA TT, JENNIFER Weeks in Treatment: 10 Wound Status Wound Number: 6 Primary Etiology: Pressure Ulcer Wound Location: Right, Lateral Ankle Wound Status: Open Wounding Event: Pressure Injury Comorbid History: Hypertension, Myocardial Infarction, Crohns, Dementia Date Acquired: 05/01/2021 Weeks Of Treatment: 0 Clustered Wound: No Photos Wound Measurements Length: (cm) 0.5 Width: (cm) 0.8 Depth: (cm) 0.2 Area: (cm) 0.314 Volume: (cm) 0.063 % Reduction in Area: 0% % Reduction in Volume: 0% Epithelialization: None Tunneling: No Undermining: No Wound  Description Classification: Category/Stage II Wound Margin: Distinct, outline attached Exudate Amount: Medium Exudate Type: Serosanguineous Exudate Color: red, brown Foul Odor After Cleansing: No Slough/Fibrino Yes Wound Bed Granulation Amount: Large (67-100%) Exposed Structure Granulation Quality: Red Fascia Exposed: No Necrotic Amount: Small (1-33%) Fat Layer (Subcutaneous Tissue) Exposed: Yes Necrotic Quality: Adherent Slough Tendon Exposed: No Muscle Exposed: No Joint Exposed: No Bone Exposed: No Electronic Signature(s) Signed: 05/04/2021 6:43:27 PM By: Lorrin Jackson Signed: 05/05/2021 10:49:45 AM By: Sandre Kitty Entered By: Sandre Kitty on 05/04/2021 15:35:41 -------------------------------------------------------------------------------- Wound Assessment Details Patient Name: Date of Service: [name], [name] 05/04/2021 1:45 PM Medical Record Number: [id number] Patient Account Number: [id number] Date of Birth/Sex: Treating RN: 21-Sep-1935 (86 y.o. [name] Primary Care Rodneshia Greenhouse: Su Monks Other Clinician: Referring Lovinia Snare: Treating Kaydenn Mclear/Extender: Kalman Shan MYA TT, JENNIFER Weeks in Treatment: 10 Wound Status Wound Number: 6 Primary Etiology: Pressure Ulcer Wound Location: Right, Lateral  Ankle Wound Status: Open Wounding Event: Pressure Injury Comorbid History: Hypertension, Myocardial Infarction, Crohns, Dementia Date Acquired: 05/01/2021 Weeks Of Treatment: 0 Clustered Wound: No Wound Measurements Length: (cm) 0.5 Width: (cm) 0.8 Depth: (cm) 0.2 Area: (cm) 0.314 Volume: (cm) 0.063 % Reduction in Area: 0% % Reduction in Volume: 0% Epithelialization: None Tunneling: No Undermining: No Wound Description Classification: Category/Stage II Wound Margin: Distinct, outline attached Exudate Amount: Medium Exudate Type: Serosanguineous Exudate Color: red, brown Foul Odor After Cleansing: No Slough/Fibrino Yes Wound  Bed Granulation Amount: Large (67-100%) Exposed Structure Granulation Quality: Red Fascia Exposed: No Necrotic Amount: Small (1-33%) Fat Layer (Subcutaneous Tissue) Exposed: Yes Necrotic Quality: Adherent Slough Tendon Exposed: No Muscle Exposed: No Joint Exposed: No Bone Exposed: No Treatment Notes Wound #6 (Ankle) Wound Laterality: Right, Lateral Cleanser Soap and Water Discharge Instruction: May shower and wash wound with dial antibacterial soap and water prior to dressing change. Peri-Wound Care Topical Primary Dressing Santyl Ointment Discharge Instruction: Apply nickel thick amount to wound bed as instructed Secondary Dressing Bordered Gauze, 2x2 in Discharge Instruction: Apply over primary dressing as directed. Secured With Compression Wrap Compression Stockings Environmental education officer) Signed: 05/04/2021 6:43:27 PM By: Lorrin Jackson Signed: 05/05/2021 10:49:45 AM By: Sandre Kitty Entered By: Sandre Kitty on 05/04/2021 15:35:46 -------------------------------------------------------------------------------- Vitals Details Patient Name: Date of Service: [name], [name] 05/04/2021 1:45 PM Medical Record Number: [id number] Patient Account Number: [id number] Date of Birth/Sex: Treating RN: August 19, 1935 (86 y.o. [name] Primary Care Anis Cinelli: Su Monks Other Clinician: Referring Briceson Broadwater: Treating Yasin Ducat/Extender: Kalman Shan MYA TT, JENNIFER Weeks in Treatment: 10 Vital Signs Time Taken: 14:57 Temperature (F): 98.3 Height (in): 63 Pulse (bpm): 86 Weight (lbs): 80 Respiratory Rate (breaths/min): 16 Body Mass Index (BMI): 14.2 Blood Pressure (mmHg): 117/70 Reference Range: 80 - 120 mg / dl Electronic Signature(s) Signed: 05/04/2021 6:43:27 PM By: Lorrin Jackson Entered By: Lorrin Jackson on 05/04/2021 14:58:23

## 2021-05-11 ENCOUNTER — Encounter (HOSPITAL_BASED_OUTPATIENT_CLINIC_OR_DEPARTMENT_OTHER): Payer: Medicare Other | Admitting: Internal Medicine

## 2021-05-11 ENCOUNTER — Other Ambulatory Visit: Payer: Self-pay

## 2021-05-11 DIAGNOSIS — G309 Alzheimer's disease, unspecified: Secondary | ICD-10-CM

## 2021-05-11 DIAGNOSIS — L89223 Pressure ulcer of left hip, stage 3: Secondary | ICD-10-CM

## 2021-05-11 DIAGNOSIS — S91001D Unspecified open wound, right ankle, subsequent encounter: Secondary | ICD-10-CM

## 2021-05-11 NOTE — Progress Notes (Signed)
SOPHIE, TAMEZ ([id number]) Visit Report for 05/11/2021 Chief Complaint Document Details Patient Name: Date of Service: [name], [place] [name] 05/11/2021 2:00 PM Medical Record Number: [id number] Patient Account Number: [id number] Date of Birth/Sex: Treating RN: Apr 22, 1935 (85 y.o. Debby Bud Primary Care Provider: Diannia Ruder Other Clinician: Referring Provider: Treating Provider/Extender: Dellie Burns in Treatment: 11 Information Obtained from: Patient Chief Complaint Left hip wound and Right lateral ankle wound Electronic Signature(s) Signed: 05/11/2021 5:12:15 PM By: Kalman Shan DO Entered By: Kalman Shan on 05/11/2021 16:53:28 -------------------------------------------------------------------------------- Debridement Details Patient Name: Date of Service: [name], [name] 05/11/2021 2:00 PM Medical Record Number: [id number] Patient Account Number: [id number] Date of Birth/Sex: Treating RN: [birth year] (85 y.o. [name], Tammi Klippel Primary Care Provider: Diannia Ruder Other Clinician: Referring Provider: Treating Provider/Extender: Dellie Burns in Treatment: 11 Debridement Performed for Assessment: Wound #6 Right,Lateral Ankle Performed By: Clinician Baruch Gouty, RN Debridement Type: Chemical/Enzymatic/Mechanical Agent Used: Santyl Level of Consciousness (Pre-procedure): Awake and Alert Pre-procedure Verification/Time Out No Taken: Bleeding: None Response to Treatment: Procedure was tolerated well Level of Consciousness (Post- Awake and Alert procedure): Post Debridement Measurements of Total Wound Length: (cm) 0.9 Stage: Category/Stage II Width: (cm) 0.7 Depth: (cm) 0.1 Volume: (cm) 0.049 Character of Wound/Ulcer Post Debridement: Requires Further Debridement Post Procedure Diagnosis Same as Pre-procedure Electronic Signature(s) Signed: 05/11/2021 5:12:15 PM By: Kalman Shan DO Signed:  05/11/2021 5:49:27 PM By: Deon Pilling Entered By: Deon Pilling on 05/11/2021 15:32:42 -------------------------------------------------------------------------------- HPI Details Patient Name: Date of Service: [name], [name] 05/11/2021 2:00 PM Medical Record Number: [id number] Patient Account Number: [id number] Date of Birth/Sex: Treating RN: [birth year] (86 y.o. [name], Tammi Klippel Primary Care Provider: Diannia Ruder Other Clinician: Referring Provider: Treating Provider/Extender: Dellie Burns in Treatment: 11 History of Present Illness HPI Description: Admission 5/9 Ms. Hindle is an 86 year old female with a past medical history of hypertension, hyperlipidemia, CAD s/p MI, and Alzheimer's Disease that presents to the clinic today for A left hip wound. The son is present and helps provide the history. This was noticed about 2 weeks ago and there was a scab to the area. When this was removed there was depth to the wound. It has gotten slightly larger over the past 3 days. Patient denies any fever/chills, nausea/vomiting and states she has been her usual state of health. Per son she has ample at home care . She has an aide that comes in 5 days a week. She is able to walk with support for short distances. This is limited due to her advanced dementia. She usually stays in a chair for most of the day and then transfers to a bed at night. 5/19; patient presents for 1 week follow-up. Son was present and helped provide the history. She has been taking Augmentin without any issues. They have been applying Santyl daily to the wound. No complaints today. 6/2; patient presents for 1 week follow-up. Son is present and helps provide the history. She has been using Dakin's wet-to-dry. She states she feels well and denies any signs of infection. 6/9; patient presents for 1 week follow-up. Son is present and helps provide the history. He continues to do Dakin's wet-to-dry.  Overall patient feels well and denies signs of infection. She is using her airflow mattress for about 12 hours a day. 6/23; patient presents for 2-week follow-up. Son is present. He has been using Dakin's wet-to-dry and has noticed improvement in the wound.  There are no issues or complaints today. She denies signs of infection. 7/8; patient presents for 2-week follow-up. Son is present. She has been using silver alginate every other day to the wound bed. Unfortunately she has had some excoriation to the surrounding skin. She also developed 2 skin tears to her left leg and is not quite sure how this happened. She denies signs of infection. 7/21; patient presents for 2-week follow-up. Son is present. She has been using Dakin wet-to-dry dressings daily. There has been improvement in the odor and drainage. No obvious signs of infection. 7/28; patient presents for 1 week follow-up. There has been a significant decrease in odor and drainage since last week with the continued use of Dakin's wet- to-dry dressings. Patient and son would like to try a new dressing to see if this will help stimulate healing. Electronic Signature(s) Signed: 05/11/2021 5:12:15 PM By: Kalman Shan DO Entered By: Kalman Shan on 05/11/2021 16:55:34 -------------------------------------------------------------------------------- Physical Exam Details Patient Name: Date of Service: [name], [name] 05/11/2021 2:00 PM Medical Record Number: [id number] Patient Account Number: [id number] Date of Birth/Sex: Treating RN: 02-27-1935 (86 y.o. Debby Bud Primary Care Provider: Diannia Ruder Other Clinician: Referring Provider: Treating Provider/Extender: Dellie Burns in Treatment: 11 Constitutional respirations regular, non-labored and within target range for patient.Marland Kitchen Psychiatric pleasant and cooperative. Notes Left trochanter: open wound with granulation tissue present and significant  undermining circumferentially. right lateral ankle has an open wound with slough No obvious signs of infection on exam. Some scattered areas of skin breakdown due to blister opening on her feet bilaterally. Electronic Signature(s) Signed: 05/11/2021 5:12:15 PM By: Kalman Shan DO Entered By: Kalman Shan on 05/11/2021 17:03:43 -------------------------------------------------------------------------------- Physician Orders Details Patient Name: Date of Service: [name], [name] 05/11/2021 2:00 PM Medical Record Number: [id number] Patient Account Number: [id number] Date of Birth/Sex: Treating RN: 24-Jun-1935 (86 y.o. Debby Bud Primary Care Provider: Diannia Ruder Other Clinician: Referring Provider: Treating Provider/Extender: Dellie Burns in Treatment: 11 Verbal / Phone Orders: No Diagnosis Coding ICD-10 Coding Code Description S81.802D Unspecified open wound, left lower leg, subsequent encounter S91.001D Unspecified open wound, right ankle, subsequent encounter L89.223 Pressure ulcer of left hip, stage 3 G30.9 Alzheimer's disease, unspecified I10 Essential (primary) hypertension N18.9 Chronic kidney disease, unspecified Follow-up Appointments Return appointment in 3 weeks. - Dr. Heber Haworth Bathing/ Shower/ Hygiene May shower with protection but do not get wound dressing(s) wet. Off-Loading Gel mattress overlay (Group 1) Roho cushion for wheelchair Turn and reposition every 2 hours [place] wound care orders this week; continue Home Health for wound care. May utilize formulary equivalent dressing for wound treatment orders unless otherwise specified. Other Home Health Orders/Instructions: - [facility] Wound Treatment Wound #1 - Trochanter Wound Laterality: Left Cleanser: Soap and Water Every Other Day/30 Days Discharge Instructions: May shower and wash wound with dial antibacterial soap and water prior to dressing change. Cleanser:  Wound Cleanser (Home Health) (Generic) Every Other Day/30 Days Discharge Instructions: Cleanse the wound with wound cleanser or normal saline prior to applying a clean dressing using gauze sponges, not tissue or cotton balls. Peri-Wound Care: Zinc Oxide Ointment 30g tube [facility]) Every Other Day/30 Days Discharge Instructions: Apply Zinc Oxide to excoriated areas Prim Dressing: Promogran Prisma Matrix, 4.34 (sq in) (silver collagen) (Home Health) Every Other Day/30 Days ary Discharge Instructions: Moisten collagen with saline or hydrogel/[place] Jelly lightly pack into wound bed. Prim Dressing: Dakin's Solution 0.125%, 16 (oz) (Home Health) Every  Other Day/30 Days ary Discharge Instructions: ***continue to use until prisma arrives at home.****Moisten gauze with Dakin's solution, lightly packing into undermining. Use Anasept gel in clinic. Prim Dressing: wet to dry gauze [facility]) Every Other Day/30 Days ary Discharge Instructions: saline or KY jelly moisten gauze backing the prisma. Secondary Dressing: Bordered Gauze, 4x4 in [facility]) Every Other Day/30 Days Discharge Instructions: Apply over primary dressing as directed. Wound #4 - Foot Wound Laterality: Plantar, Left Cleanser: Soap and Water 1 x Per Day/15 Days Discharge Instructions: May shower and wash wound with dial antibacterial soap and water prior to dressing change. Prim Dressing: Triple Antibiotic Ointment, 0.9 (g) packet (Home Health) 1 x Per Day/15 Days ary Discharge Instructions: Apply to wound bed Secondary Dressing: Bordered Gauze, 2x2 in 1 x Per Day/15 Days Discharge Instructions: Apply over primary dressing as directed. Wound #6 - Ankle Wound Laterality: Right, Lateral Cleanser: Soap and Water 1 x Per [facility]/62 Days Discharge Instructions: May shower and wash wound with dial antibacterial soap and water prior to dressing change. Prim Dressing: Santyl Ointment (Home Health) 1 x Per Day/15 Days ary Discharge  Instructions: Apply nickel thick amount to wound bed as instructed Secondary Dressing: Bordered Gauze, 2x2 in ([place]) 1 x Per Day/15 Days Discharge Instructions: Apply over primary dressing as directed. Wound #7 - Calcaneus Wound Laterality: Right Cleanser: Soap and Water (Home Health) 1 x Per Day/30 Days Discharge Instructions: May shower and wash wound with dial antibacterial soap and water prior to dressing change. Cleanser: Wound Cleanser (Home Health) 1 x Per Day/30 Days Discharge Instructions: Cleanse the wound with wound cleanser prior to applying a clean dressing using gauze sponges, not tissue or cotton balls. Prim Dressing: triple antibiotic ointment (Home Health) 1 x Per Day/30 Days ary Discharge Instructions: apply triple antibiotic ointment to right heel Secondary Dressing: Woven Gauze Sponge, Non-Sterile 4x4 in (Home Health) 1 x Per Day/30 Days Discharge Instructions: Apply over primary dressing as directed. Secondary Dressing: ALLEVYN Heel 4 1/2in x 5 1/2in / 10.5cm x 13.5cm (Home Health) 1 x Per Day/30 Days Discharge Instructions: May use a abd pad or foam feel cup for protection. Apply over primary dressing as directed. Secured With: [employer], 4.5x3.1 (in/yd) (Home Health) 1 x Per Day/30 Days Discharge Instructions: Secure with Kerlix as directed. Electronic Signature(s) Signed: 05/11/2021 5:12:15 PM By: Kalman Shan DO Entered By: Kalman Shan on 05/11/2021 17:08:24 -------------------------------------------------------------------------------- Problem List Details Patient Name: Date of Service: [name], [name] 05/11/2021 2:00 PM Medical Record Number: [id number] Patient Account Number: [id number] Date of Birth/Sex: Treating RN: 09/05/1935 (86 y.o. [name], Tammi Klippel Primary Care Provider: Diannia Ruder Other Clinician: Referring Provider: Treating Provider/Extender: Dellie Burns in Treatment: 11 Active  Problems ICD-10 Encounter Code Description Active Date MDM Diagnosis S81.802D Unspecified open wound, left lower leg, subsequent encounter 05/11/2021 No Yes S91.001D Unspecified open wound, right ankle, subsequent encounter 05/11/2021 No Yes L89.223 Pressure ulcer of left hip, stage 3 02/20/2021 No Yes G30.9 Alzheimer's disease, unspecified 02/20/2021 No Yes I10 Essential (primary) hypertension 02/20/2021 No Yes N18.9 Chronic kidney disease, unspecified 02/20/2021 No Yes Inactive Problems ICD-10 Code Description Active Date Inactive Date S81.802A Unspecified open wound, left lower leg, initial encounter 05/04/2021 05/08/2021 S91.001A Unspecified open wound, right ankle, initial encounter 05/04/2021 05/08/2021 Resolved Problems Electronic Signature(s) Signed: 05/11/2021 5:12:15 PM By: Kalman Shan DO Entered By: Kalman Shan on 05/11/2021 16:53:08 -------------------------------------------------------------------------------- Progress Note Details Patient Name: Date of Service: [name], [name] 05/11/2021  2:00 PM Medical Record Number: [id number] Patient Account Number: [id number] Date of Birth/Sex: Treating RN: Jul 12, 1935 (86 y.o. [name], Tammi Klippel Primary Care Provider: Diannia Ruder Other Clinician: Referring Provider: Treating Provider/Extender: Dellie Burns in Treatment: 11 Subjective Chief Complaint Information obtained from Patient Left hip wound and Right lateral ankle wound History of Present Illness (HPI) Admission 5/9 Ms. Bour is an 86 year old female with a past medical history of hypertension, hyperlipidemia, CAD s/p MI, and Alzheimer's Disease that presents to the clinic today for A left hip wound. The son is present and helps provide the history. This was noticed about 2 weeks ago and there was a scab to the area. When this was removed there was depth to the wound. It has gotten slightly larger over the past 3 days. Patient denies any  fever/chills, nausea/vomiting and states she has been her usual state of health. Per son she has ample at home care . She has an aide that comes in 5 days a week. She is able to walk with support for short distances. This is limited due to her advanced dementia. She usually stays in a chair for most of the day and then transfers to a bed at night. 5/19; patient presents for 1 week follow-up. Son was present and helped provide the history. She has been taking Augmentin without any issues. They have been applying Santyl daily to the wound. No complaints today. 6/2; patient presents for 1 week follow-up. Son is present and helps provide the history. She has been using Dakin's wet-to-dry. She states she feels well and denies any signs of infection. 6/9; patient presents for 1 week follow-up. Son is present and helps provide the history. He continues to do Dakin's wet-to-dry. Overall patient feels well and denies signs of infection. She is using her airflow mattress for about 12 hours a day. 6/23; patient presents for 2-week follow-up. Son is present. He has been using Dakin's wet-to-dry and has noticed improvement in the wound. There are no issues or complaints today. She denies signs of infection. 7/8; patient presents for 2-week follow-up. Son is present. She has been using silver alginate every other day to the wound bed. Unfortunately she has had some excoriation to the surrounding skin. She also developed 2 skin tears to her left leg and is not quite sure how this happened. She denies signs of infection. 7/21; patient presents for 2-week follow-up. Son is present. She has been using Dakin wet-to-dry dressings daily. There has been improvement in the odor and drainage. No obvious signs of infection. 7/28; patient presents for 1 week follow-up. There has been a significant decrease in odor and drainage since last week with the continued use of Dakin's wet- to-dry dressings. Patient and son would like  to try a new dressing to see if this will help stimulate healing. Patient History Unable to Obtain Patient History due to Dementia. Information obtained from Patient. Family History Unknown History. Social History Never smoker, Marital Status - Married, Alcohol Use - Never, Drug Use - No History, Caffeine Use - Never. Medical History Eyes Denies history of Cataracts, Glaucoma, Optic Neuritis Ear/Nose/Mouth/Throat Denies history of Chronic sinus problems/congestion, Middle ear problems Hematologic/Lymphatic Denies history of Anemia, Hemophilia, Human Immunodeficiency Virus, Lymphedema, Sickle Cell Disease Respiratory Denies history of Aspiration, Asthma, Chronic Obstructive Pulmonary Disease (COPD), Pneumothorax, Sleep Apnea, Tuberculosis Cardiovascular Patient has history of Hypertension, Myocardial Infarction - 2019 Denies history of Angina, Arrhythmia, Congestive Heart Failure, Coronary Artery Disease, Deep Vein Thrombosis,  Hypotension, Peripheral Arterial Disease, Peripheral Venous Disease, Phlebitis, Vasculitis Gastrointestinal Patient has history of Crohnoos Denies history of Cirrhosis , Colitis, Hepatitis A, Hepatitis B, Hepatitis C Endocrine Denies history of Type I Diabetes, Type II Diabetes Genitourinary Denies history of End Stage Renal Disease Immunological Denies history of Lupus Erythematosus, Raynaudoos, Scleroderma Integumentary (Skin) Denies history of History of Burn Musculoskeletal Denies history of Gout, Rheumatoid Arthritis, Osteoarthritis, Osteomyelitis Neurologic Patient has history of Dementia Denies history of Neuropathy, Quadriplegia, Paraplegia, Seizure Disorder Oncologic Denies history of Received Chemotherapy, Received Radiation Psychiatric Denies history of Anorexia/bulimia, Confinement Anxiety Hospitalization/Surgery History - left heart cath and coronary angiography. - cardiac cath. - EGD. - colectomy. - dilation and curettage of uterus. - lap  right hemicolectomy. Medical A Surgical History Notes nd Gastrointestinal incontienent Genitourinary incontinent; CKD stage 4 Neurologic hx TIA; alzheimer's Objective Constitutional respirations regular, non-labored and within target range for patient.. Vitals Time Taken: 2:41 PM, Height: 63 in, Weight: 80 lbs, BMI: 14.2, Temperature: 98.5 F, Pulse: 79 bpm, Respiratory Rate: 16 breaths/min, Blood Pressure: 96/58 mmHg. Psychiatric pleasant and cooperative. General Notes: Left trochanter: open wound with granulation tissue present and significant undermining circumferentially. right lateral ankle has an open wound with slough No obvious signs of infection on exam. Some scattered areas of skin breakdown due to blister opening on her feet bilaterally. Integumentary (Hair, Skin) Wound #1 status is Open. Original cause of wound was Gradually Appeared. The date acquired was: 02/05/2021. The wound has been in treatment 11 weeks. The wound is located on the Left Trochanter. The wound measures 1cm length x 0.7cm width x 0.8cm depth; 0.55cm^2 area and 0.44cm^3 volume. There is Fat Layer (Subcutaneous Tissue) exposed. There is no tunneling or undermining noted. There is a large amount of serosanguineous drainage noted. The wound margin is well defined and not attached to the wound base. There is large (67-100%) red, pink granulation within the wound bed. There is no necrotic tissue within the wound bed. Wound #2 status is Healed - Epithelialized. Original cause of wound was Trauma. The date acquired was: 04/21/2021. The wound has been in treatment 2 weeks. The wound is located on the Left,Medial Lower Leg. The wound measures 0cm length x 0cm width x 0cm depth; 0cm^2 area and 0cm^3 volume. Wound #4 status is Open. Original cause of wound was Pressure Injury. The date acquired was: 05/01/2021. The wound has been in treatment 1 weeks. The wound is located on the [place]. The wound measures 2.6cm  length x 3cm width x 0.1cm depth; 6.126cm^2 area and 0.613cm^3 volume. There is Fat Layer (Subcutaneous Tissue) exposed. There is no tunneling or undermining noted. There is a medium amount of serosanguineous drainage noted. The wound margin is distinct with the outline attached to the wound base. There is no granulation within the wound bed. There is a large (67-100%) amount of necrotic tissue within the wound bed including Eschar. Wound #5 status is Healed - Epithelialized. Original cause of wound was Blister. The date acquired was: 05/01/2021. The wound has been in treatment 1 weeks. The wound is located on the Left,Posterior Ankle. The wound measures 0cm length x 0cm width x 0cm depth; 0cm^2 area and 0cm^3 volume. Wound #6 status is Open. Original cause of wound was Pressure Injury. The date acquired was: 05/01/2021. The wound has been in treatment 1 weeks. The wound is located on the Right,Lateral Ankle. The wound measures 0.9cm length x 0.7cm width x 0.1cm depth; 0.495cm^2 area and 0.049cm^3 volume. There is Fat Layer (  Subcutaneous Tissue) exposed. There is no tunneling or undermining noted. There is a medium amount of serosanguineous drainage noted. The wound margin is distinct with the outline attached to the wound base. There is large (67-100%) red granulation within the wound bed. There is a small (1-33%) amount of necrotic tissue within the wound bed including Adherent Slough. Wound #7 status is Open. Original cause of wound was Pressure Injury. The date acquired was: 05/11/2021. The wound is located on the Right Calcaneus. The wound measures 4.5cm length x 3.3cm width x 0.1cm depth; 11.663cm^2 area and 1.166cm^3 volume. There is Fat Layer (Subcutaneous Tissue) exposed. There is no tunneling or undermining noted. There is a medium amount of serosanguineous drainage noted. The wound margin is distinct with the outline attached to the wound base. There is no granulation within the wound bed.  There is a large (67-100%) amount of necrotic tissue within the wound bed including Eschar. Assessment Active Problems ICD-10 Unspecified open wound, left lower leg, subsequent encounter Unspecified open wound, right ankle, subsequent encounter Pressure ulcer of left hip, stage 3 Alzheimer's disease, unspecified Essential (primary) hypertension Chronic kidney disease, unspecified Patient's wounds are stable. I recommended continuing Santyl to the right ankle wound. For the left trochanter wound I recommended starting collagen with wet-to-dry backing. I recommended that if the patient were to develop yellow drainage or odor she go back to Dakin's wet-to-dry dressings. We agreed on giving the collagen some time to work and follow-up will be in 3 weeks. He knows to call with any questions or concerns and patient can be seen sooner. Procedures Wound #6 Pre-procedure diagnosis of Wound #6 is a Pressure Ulcer located on the Right,Lateral Ankle . There was a Chemical/Enzymatic/Mechanical debridement performed by Baruch Gouty, RN.Marland Kitchen Agent used was [employer]. There was no bleeding. The procedure was tolerated well. Post Debridement Measurements: 0.9cm length x 0.7cm width x 0.1cm depth; 0.049cm^3 volume. Post debridement Stage noted as Category/Stage II. Character of Wound/Ulcer Post Debridement requires further debridement. Post procedure Diagnosis Wound #6: Same as Pre-Procedure Plan Follow-up Appointments: Return appointment in 3 weeks. - Dr. Heber Talmo Bathing/ Shower/ Hygiene: May shower with protection but do not get wound dressing(s) wet. Off-Loading: Gel mattress overlay (Group 1) Roho cushion for wheelchair Turn and reposition every 2 hours Home Health: New wound care orders this week; continue Home Health for wound care. May utilize formulary equivalent dressing for wound treatment orders unless otherwise specified. Other Home Health Orders/Instructions: Alvis Lemmings WOUND #1: - Trochanter  Wound Laterality: Left Cleanser: Soap and Water Every Other Day/30 Days Discharge Instructions: May shower and wash wound with dial antibacterial soap and water prior to dressing change. Cleanser: Wound Cleanser (Home Health) (Generic) Every Other Day/30 Days Discharge Instructions: Cleanse the wound with wound cleanser or normal saline prior to applying a clean dressing using gauze sponges, not tissue or cotton balls. Peri-Wound Care: Zinc Oxide Ointment 30g tube [facility]) Every Other Day/30 Days Discharge Instructions: Apply Zinc Oxide to excoriated areas Prim Dressing: Promogran Prisma Matrix, 4.34 (sq in) (silver collagen) (Home Health) Every Other Day/30 Days ary Discharge Instructions: Moisten collagen with saline or hydrogel/[place] Jelly lightly pack into wound bed. Prim Dressing: Dakin's Solution 0.125%, 16 (oz) (Home Health) Every Other Day/30 Days ary Discharge Instructions: ***continue to use until prisma arrives at home.****Moisten gauze with Dakin's solution, lightly packing into undermining. Use Anasept gel in clinic. Prim Dressing: wet to dry gauze [facility]) Every Other Day/30 Days ary Discharge Instructions: saline or KY jelly moisten gauze  backing the prisma. Secondary Dressing: Bordered Gauze, 4x4 in [facility]) Every Other Day/30 Days Discharge Instructions: Apply over primary dressing as directed. WOUND #4: - Foot Wound Laterality: Plantar, Left Cleanser: Soap and Water 1 x Per Day/15 Days Discharge Instructions: May shower and wash wound with dial antibacterial soap and water prior to dressing change. Prim Dressing: Triple Antibiotic Ointment, 0.9 (g) packet (Home Health) 1 x Per Day/15 Days ary Discharge Instructions: Apply to wound bed Secondary Dressing: Bordered Gauze, 2x2 in 1 x Per Day/15 Days Discharge Instructions: Apply over primary dressing as directed. WOUND #6: - Ankle Wound Laterality: Right, Lateral Cleanser: Soap and Water 1 x Per SFK/81  Days Discharge Instructions: May shower and wash wound with dial antibacterial soap and water prior to dressing change. Prim Dressing: Santyl Ointment (Home Health) 1 x Per Day/15 Days ary Discharge Instructions: Apply nickel thick amount to wound bed as instructed Secondary Dressing: Bordered Gauze, 2x2 in ([facility]) 1 x Per Day/15 Days Discharge Instructions: Apply over primary dressing as directed. WOUND #7: - Calcaneus Wound Laterality: Right Cleanser: Soap and Water (Home Health) 1 x Per Day/30 Days Discharge Instructions: May shower and wash wound with dial antibacterial soap and water prior to dressing change. Cleanser: Wound Cleanser (Home Health) 1 x Per Day/30 Days Discharge Instructions: Cleanse the wound with wound cleanser prior to applying a clean dressing using gauze sponges, not tissue or cotton balls. Prim Dressing: triple antibiotic ointment (Home Health) 1 x Per Day/30 Days ary Discharge Instructions: apply triple antibiotic ointment to right heel Secondary Dressing: Woven Gauze Sponge, Non-Sterile 4x4 in (Home Health) 1 x Per Day/30 Days Discharge Instructions: Apply over primary dressing as directed. Secondary Dressing: ALLEVYN Heel 4 1/2in x 5 1/2in / 10.5cm x 13.5cm (Home Health) 1 x Per Day/30 Days Discharge Instructions: May use a abd pad or foam feel cup for protection. Apply over primary dressing as directed. Secured With: [employer], 4.5x3.1 (in/yd) (Home Health) 1 x Per Day/30 Days Discharge Instructions: Secure with Kerlix as directed. 1. Santyl to the right ankle wound 2. Collagen with wet-to-dry dressing to the left trochanter wound 3. Follow-up in 3 weeks Electronic Signature(s) Signed: 05/11/2021 5:12:15 PM By: Kalman Shan DO Entered By: Kalman Shan on 05/11/2021 17:10:50 -------------------------------------------------------------------------------- HxROS Details Patient Name: Date of Service: [name], [name] 05/11/2021 2:00  PM Medical Record Number: [id number] Patient Account Number: [id number] Date of Birth/Sex: Treating RN: May 21, 1935 (86 y.o. Debby Bud Primary Care Provider: Diannia Ruder Other Clinician: Referring Provider: Treating Provider/Extender: Dellie Burns in Treatment: 11 Unable to Obtain Patient History due to Dementia Information Obtained From Patient Eyes Medical History: Negative for: Cataracts; Glaucoma; Optic Neuritis Ear/Nose/Mouth/Throat Medical History: Negative for: Chronic sinus problems/congestion; Middle ear problems Hematologic/Lymphatic Medical History: Negative for: Anemia; Hemophilia; Human Immunodeficiency Virus; Lymphedema; Sickle Cell Disease Respiratory Medical History: Negative for: Aspiration; Asthma; Chronic Obstructive Pulmonary Disease (COPD); Pneumothorax; Sleep Apnea; Tuberculosis Cardiovascular Medical History: Positive for: Hypertension; Myocardial Infarction - 2019 Negative for: Angina; Arrhythmia; Congestive Heart Failure; Coronary Artery Disease; Deep Vein Thrombosis; Hypotension; Peripheral Arterial Disease; Peripheral Venous Disease; Phlebitis; Vasculitis Gastrointestinal Medical History: Positive for: Crohns Negative for: Cirrhosis ; Colitis; Hepatitis A; Hepatitis B; Hepatitis C Past Medical History Notes: incontienent Endocrine Medical History: Negative for: Type I Diabetes; Type II Diabetes Genitourinary Medical History: Negative for: End Stage Renal Disease Past Medical History Notes: incontinent; CKD stage 4 Immunological Medical History: Negative for: Lupus Erythematosus; Raynauds; Scleroderma Integumentary (Skin) Medical History:  Negative for: History of Burn Musculoskeletal Medical History: Negative for: Gout; Rheumatoid Arthritis; Osteoarthritis; Osteomyelitis Neurologic Medical History: Positive for: Dementia Negative for: Neuropathy; Quadriplegia; Paraplegia; Seizure Disorder Past  Medical History Notes: hx TIA; alzheimer's Oncologic Medical History: Negative for: Received Chemotherapy; Received Radiation Psychiatric Medical History: Negative for: Anorexia/bulimia; Confinement Anxiety Immunizations Pneumococcal Vaccine: Received Pneumococcal Vaccination: No Implantable Devices None Hospitalization / Surgery History Type of Hospitalization/Surgery left heart cath and coronary angiography cardiac cath EGD colectomy dilation and curettage of uterus lap right hemicolectomy Family and Social History Unknown History: Yes; Never smoker; Marital Status - Married; Alcohol Use: Never; Drug Use: No History; Caffeine Use: Never; Financial Concerns: No; Food, Clothing or Shelter Needs: No; Support System Lacking: No; Transportation Concerns: No Electronic Signature(s) Signed: 05/11/2021 5:12:15 PM By: Kalman Shan DO Signed: 05/11/2021 5:49:27 PM By: Deon Pilling Entered By: Kalman Shan on 05/11/2021 16:55:47 -------------------------------------------------------------------------------- SuperBill Details Patient Name: Date of Service: [name], [name] 05/11/2021 Medical Record Number: [id number] Patient Account Number: [id number] Date of Birth/Sex: Treating RN: 08-28-1935 (86 y.o. Debby Bud Primary Care Provider: Diannia Ruder Other Clinician: Referring Provider: Treating Provider/Extender: Dellie Burns in Treatment: 11 Diagnosis Coding ICD-10 Codes Code Description S81.802D Unspecified open wound, left lower leg, subsequent encounter S91.001D Unspecified open wound, right ankle, subsequent encounter L89.223 Pressure ulcer of left hip, stage 3 G30.9 Alzheimer's disease, unspecified I10 Essential (primary) hypertension N18.9 Chronic kidney disease, unspecified Facility Procedures CPT4 Code: [id number] Description: [phone] - DEBRIDE W/O ANES NON SELECT Modifier: Quantity: 1 Physician Procedures : CPT4 Code  Description Modifier [id number] - WC PHYS LEVEL 3 - EST PT ICD-10 Diagnosis Description L89.223 Pressure ulcer of left hip, stage 3 S91.001D Unspecified open wound, right ankle, subsequent encounter G30.9 Alzheimer's disease, unspecified Quantity: 1 Electronic Signature(s) Signed: 05/11/2021 5:12:15 PM By: Kalman Shan DO Entered By: Kalman Shan on 05/11/2021 17:11:28

## 2021-05-15 NOTE — Progress Notes (Signed)
[name], [name] ([id number]) Visit Report for 05/11/2021 Arrival Information Details Patient Name: Date of Service: [name], [name] Rhonda G. 05/11/2021 2:00 PM Medical Record Number: [id number] Patient Account Number: [id number] Date of Birth/Sex: Treating RN: 12/21/1934 (86 y.o. [name] Primary Care [name]: [name] Other Clinician: Referring [name]: Treating [name]/Extender: [name] in Treatment: 11 Visit Information History Since Last Visit Added or deleted any medications: No Patient Arrived: Wheel Chair Any new allergies or adverse reactions: No Arrival Time: 14:40 Had a fall or experienced change in No Accompanied By: son activities of daily living that may affect Transfer Assistance: None risk of falls: Patient Identification Verified: Yes Signs or symptoms of abuse/neglect since last visito No Secondary Verification Process Completed: Yes Hospitalized since last visit: No Patient Requires Transmission-Based Precautions: No Implantable device outside of the clinic excluding No Patient Has Alerts: No cellular tissue based products placed in the center since last visit: Has Dressing in Place as Prescribed: Yes Pain Present Now: No Electronic Signature(s) Signed: 05/15/2021 1:33:39 PM By: [name] Entered By: [name] on 05/11/2021 14:41:27 -------------------------------------------------------------------------------- Encounter Discharge Information Details Patient Name: Date of Service: Rhonda [name], SA Rhonda G. 05/11/2021 2:00 PM Medical Record Number: [id number] Patient Account Number: [id number] Date of Birth/Sex: Treating RN: 02-18-1935 (86 y.o. [name] Primary Care [name]: [name] Other Clinician: Referring Marli Diego: Treating [name]/Extender: [name] in Treatment: 11 Encounter Discharge Information Items Post Procedure Vitals Discharge Condition:  Stable Temperature (F): 98.5 Ambulatory Status: Wheelchair Pulse (bpm): 78 Discharge Destination: Home Respiratory Rate (breaths/min): 18 Transportation: Private Auto Blood Pressure (mmHg): 96/58 Accompanied By: son Schedule Follow-up Appointment: Yes Clinical Summary of Care: Patient Declined Electronic Signature(s) Signed: 05/12/2021 2:27:43 PM By: Rhonda Gouty RN, BSN Entered By: [name] on 05/11/2021 17:30:23 -------------------------------------------------------------------------------- Lower Extremity Assessment Details Patient Name: Date of Service: Rhonda [name], SA Rhonda G. 05/11/2021 2:00 PM Medical Record Number: [id number] Patient Account Number: [id number] Date of Birth/Sex: Treating RN: [birth year] (86 y.o. [name] Primary Care Aslin Farinas: [name] Other Clinician: Referring Nyles Mitton: Treating [name]/Extender: [name] in Treatment: 11 Edema Assessment Assessed: [name]: Yes] [name]: Yes] Edema: [Left: No] [Right: No] Calf Left: Right: Point of Measurement: From Medial Instep 20 cm 20 cm Ankle Left: Right: Point of Measurement: From Medial Instep 17 cm 17 cm Vascular Assessment Pulses: Dorsalis Pedis Palpable: [Left:Yes] [Right:Yes] Electronic Signature(s) Signed: 05/11/2021 5:31:46 PM By: [name] Entered By: [name] on 05/11/2021 14:45:29 -------------------------------------------------------------------------------- Multi Wound Chart Details Patient Name: Date of Service: Rhonda [name], SA Rhonda G. 05/11/2021 2:00 PM Medical Record Number: [id number] Patient Account Number: [id number] Date of Birth/Sex: Treating RN: 1935-04-18 (86 y.o. [name] Primary Care Henderson Frampton: [name] Other Clinician: Referring [name]: Treating [name]/Extender: [name] in Treatment: 11 Vital Signs Height(in): 63 Pulse(bpm): 79 Weight(lbs): 80 Blood Pressure(mmHg):  96/58 Body Mass Index(BMI): 14 Temperature(F): 98.5 Respiratory Rate(breaths/min): 16 Photos: [1:Left Trochanter] [2:No Photos Left, Medial Lower Leg] [4:Left, Plantar Foot] Wound Location: [1:Gradually Appeared] [2:Trauma] [4:Pressure Injury] Wounding Event: [1:Pressure Ulcer] [2:Trauma, Other] [4:Pressure Ulcer] Primary Etiology: [1:Hypertension, Myocardial Infarction,] [4:Hypertension, Myocardial Infarction,] Comorbid History: [1:Crohns, Dementia 02/05/2021] [2:Crohns, Dementia 04/21/2021] [4:Crohns, Dementia 05/01/2021] Date Acquired: [1:11] [2:2] [4:1] Weeks of Treatment: [1:Open] [2:Healed - Epithelialized] [4:Open] Wound Status: [1:1x0.7x0.8] [2:0x0x0] [4:2.6x3x0.1] Measurements L x W x D (cm) [1:0.55] [2:0] [4:6.126] A (cm) : rea [1:0.44] [2:0] [4:0.613] Volume (cm) : [1:95.30%] [2:100.00%] [4:13.30%] % Reduction in A [1:rea: 98.10%] [2:100.00%] [  4:13.30%] % Reduction in Volume: [1:Category/Stage III] [2:Full Thickness Without Exposed] [4:Category/Stage II] Classification: [1:Large] [2:Support Structures N/A] [4:Medium] Exudate A mount: [1:Serosanguineous] [2:N/A] [4:Serosanguineous] Exudate Type: [1:red, brown] [2:N/A] [4:red, brown] Exudate Color: [1:Well defined, not attached] [2:N/A] [4:Distinct, outline attached] Wound Margin: [1:Large (67-100%)] [2:N/A] [4:None Present (0%)] Granulation A mount: [1:Red, Pink] [2:N/A] [4:N/A] Granulation Quality: [1:None Present (0%)] [2:N/A] [4:Large (67-100%)] Necrotic A mount: [1:N/A] [2:N/A] [4:Eschar] Necrotic Tissue: [1:Fat Layer (Subcutaneous Tissue): Yes N/A] [4:Fat Layer (Subcutaneous Tissue): Yes] Exposed Structures: [1:Fascia: No Tendon: No Muscle: No Joint: No Bone: No Large (67-100%)] [2:N/A] [4:Fascia: No Tendon: No Muscle: No Joint: No Bone: No Small (1-33%)] Epithelialization: [1:N/A] [2:N/A] [4:N/A] Debridement: [1:N/A] [2:N/A] [4:N/A] Instrument: [1:N/A] [2:N/A] [4:N/A] Bleeding: Debridement Treatment Response: N/A  [2:N/A] [4:N/A] Post Debridement Measurements L x N/A [2:N/A] [4:N/A] W x D (cm) [1:N/A] [2:N/A] [4:N/A] Post Debridement Volume: (cm) [1:N/A] [2:N/A] [4:N/A] Post Debridement Stage: [1:N/A] [2:N/A] [4:N/A] Wound Number: 5 6 7  Photos: No Photos Left, Posterior Ankle Right, Lateral Ankle Right Calcaneus Wound Location: Blister Pressure Injury Pressure Injury Wounding Event: Venous Leg Ulcer Pressure Ulcer Pressure Ulcer Primary Etiology: Hypertension, Myocardial Infarction, Hypertension, Myocardial Infarction, Hypertension, Myocardial Infarction, Comorbid History: Crohns, Dementia Crohns, Dementia Crohns, Dementia 05/01/2021 05/01/2021 05/11/2021 Date Acquired: 1 1 0 Weeks of Treatment: Healed - Epithelialized Open Open Wound Status: 0x0x0 0.9x0.7x0.1 4.5x3.3x0.1 Measurements L x W x D (cm) 0 0.495 11.663 A (cm) : rea 0 0.049 1.166 Volume (cm) : 100.00% -57.60% 0.00% % Reduction in Area: 100.00% 22.20% 0.00% % Reduction in Volume: Full Thickness Without Exposed Category/Stage II Unstageable/Unclassified Classification: Support Structures N/A Medium Medium Exudate A mount: N/A Serosanguineous Serosanguineous Exudate Type: N/A red, brown red, brown Exudate Color: N/A Distinct, outline attached Distinct, outline attached Wound Margin: N/A Large (67-100%) None Present (0%) Granulation Amount: N/A Red N/A Granulation Quality: N/A Small (1-33%) Large (67-100%) Necrotic Amount: N/A Adherent Slough Eschar Necrotic Tissue: N/A Fat Layer (Subcutaneous Tissue): Yes Fat Layer (Subcutaneous Tissue): Yes Exposed Structures: Fascia: No Fascia: No Tendon: No Tendon: No Muscle: No Muscle: No Joint: No Joint: No Bone: No Bone: No N/A None None Epithelialization: N/A Chemical/Enzymatic/Mechanical N/A Debridement: N/A N/A N/A Instrument: N/A None N/A Bleeding: N/A Procedure was tolerated well N/A Debridement Treatment Response: N/A 0.9x0.7x0.1 N/A Post  Debridement Measurements L x W x D (cm) N/A 0.049 N/A Post Debridement Volume: (cm) N/A Category/Stage II N/A Post Debridement Stage: N/A Debridement N/A Procedures Performed: Treatment Notes Electronic Signature(s) Signed: 05/11/2021 5:12:15 PM By: Kalman Shan DO Signed: 05/11/2021 5:49:27 PM By: Deon Pilling Entered By: Kalman Shan on 05/11/2021 16:53:18 -------------------------------------------------------------------------------- Multi-Disciplinary Care Plan Details Patient Name: Date of Service: [name], SA Rhonda G. 05/11/2021 2:00 PM Medical Record Number: [id number] Patient Account Number: [id number] Date of Birth/Sex: Treating RN: 03-27-1935 (86 y.o. [name] Primary Care Linnie Mcglocklin: [name] Other Clinician: Referring Emilina Smarr: Treating Cheree Fowles/Extender: [name] in Treatment: 11 Multidisciplinary Care Plan reviewed with physician Active Inactive Pressure Nursing Diagnoses: Knowledge deficit related to causes and risk factors for pressure ulcer development Knowledge deficit related to management of pressures ulcers Potential for impaired tissue integrity related to pressure, friction, moisture, and shear Goals: Patient/caregiver will verbalize risk factors for pressure ulcer development Date Initiated: 02/20/2021 Target Resolution Date: 05/12/2021 Goal Status: Active Patient/caregiver will verbalize understanding of pressure ulcer management Date Initiated: 02/20/2021 Date Inactivated: 03/23/2021 Target Resolution Date: 03/24/2021 Goal Status: Met Interventions: Assess: immobility, friction, shearing, incontinence upon admission and as needed Assess offloading mechanisms upon admission  and as needed Assess potential for pressure ulcer upon admission and as needed Provide education on pressure ulcers Notes: Wound/Skin Impairment Nursing Diagnoses: Impaired tissue integrity Knowledge deficit related to  ulceration/compromised skin integrity Goals: Patient/caregiver will verbalize understanding of skin care regimen Date Initiated: 02/20/2021 Target Resolution Date: 05/12/2021 Goal Status: Active Ulcer/skin breakdown will have a volume reduction of 30% by week 4 Date Initiated: 02/20/2021 Date Inactivated: 03/23/2021 Target Resolution Date: 03/24/2021 Unmet Reason: deeper according to Goal Status: Unmet wound care measurements. Interventions: Assess patient/caregiver ability to obtain necessary supplies Assess patient/caregiver ability to perform ulcer/skin care regimen upon admission and as needed Assess ulceration(s) every visit Provide education on ulcer and skin care Notes: Electronic Signature(s) Signed: 05/11/2021 5:49:27 PM By: Deon Pilling Entered By: Deon Pilling on 05/11/2021 15:31:01 -------------------------------------------------------------------------------- Pain Assessment Details Patient Name: Date of Service: [name], SA Rhonda G. 05/11/2021 2:00 PM Medical Record Number: [id number] Patient Account Number: [id number] Date of Birth/Sex: Treating RN: 20-Sep-1935 (86 y.o. Debby Bud Primary Care Christo Hain: [name] Other Clinician: Referring Carlous Olivares: Treating Mancel Lardizabal/Extender: [name] in Treatment: 11 Active Problems Location of Pain Severity and Description of Pain Patient Has Paino No Site Locations Pain Management and Medication Current Pain Management: Electronic Signature(s) Signed: 05/11/2021 5:49:27 PM By: Deon Pilling Signed: 05/15/2021 1:33:39 PM By: [name] Entered By: [name] on 05/11/2021 14:41:52 -------------------------------------------------------------------------------- Patient/Caregiver Education Details Patient Name: Date of Service: Rhonda Shelbie Proctor Rhonda Darnell Level 7/28/2022andnbsp2:00 PM Medical Record Number: [id number] Patient Account Number: [id number] Date of Birth/Gender: Treating  RN: Oct 13, 1935 (86 y.o. Debby Bud Primary Care Physician: [name] Other Clinician: Referring Physician: Treating Physician/Extender: [name] in Treatment: 11 Education Assessment Education Provided To: Patient Education Topics Provided Wound/Skin Impairment: Handouts: Skin Care Do's and Dont's Methods: Explain/Verbal Responses: Reinforcements needed Electronic Signature(s) Signed: 05/11/2021 5:49:27 PM By: Deon Pilling Entered By: Deon Pilling on 05/11/2021 15:31:13 -------------------------------------------------------------------------------- Wound Assessment Details Patient Name: Date of Service: [name], SA Rhonda G. 05/11/2021 2:00 PM Medical Record Number: [id number] Patient Account Number: [id number] Date of Birth/Sex: Treating RN: [birth year] (86 y.o. [name] Primary Care Ura Yingling: [name] Other Clinician: Referring Jabrea Kallstrom: Treating Jamyria Ozanich/Extender: [name] in Treatment: 11 Wound Status Wound Number: 1 Primary Etiology: Pressure Ulcer Wound Location: Left Trochanter Wound Status: Open Wounding Event: Gradually Appeared Comorbid History: Hypertension, Myocardial Infarction, Crohns, Dementia Date Acquired: 02/05/2021 Weeks Of Treatment: 11 Clustered Wound: No Photos Wound Measurements Length: (cm) 1 Width: (cm) 0.7 Depth: (cm) 0.8 Area: (cm) 0.55 Volume: (cm) 0.44 % Reduction in Area: 95.3% % Reduction in Volume: 98.1% Epithelialization: Large (67-100%) Tunneling: No Undermining: No Wound Description Classification: Category/Stage III Wound Margin: Well defined, not attached Exudate Amount: Large Exudate Type: Serosanguineous Exudate Color: red, brown Foul Odor After Cleansing: No Slough/Fibrino No Wound Bed Granulation Amount: Large (67-100%) Exposed Structure Granulation Quality: Red, Pink Fascia Exposed: No Necrotic Amount: None Present (0%) Fat  Layer (Subcutaneous Tissue) Exposed: Yes Tendon Exposed: No Muscle Exposed: No Joint Exposed: No Bone Exposed: No Treatment Notes Wound #1 (Trochanter) Wound Laterality: Left Cleanser Soap and Water Discharge Instruction: May shower and wash wound with dial antibacterial soap and water prior to dressing change. Wound Cleanser Discharge Instruction: Cleanse the wound with wound cleanser or normal saline prior to applying a clean dressing using gauze sponges, not tissue or cotton balls. Peri-Wound Care Zinc Oxide Ointment 30g tube Discharge Instruction: Apply Zinc Oxide to excoriated areas Topical Primary Dressing Promogran Prisma Matrix,  4.34 (sq in) (silver collagen) Discharge Instruction: Moisten collagen with saline or hydrogel/[place] Jelly lightly pack into wound bed. Dakin's Solution 0.125%, 16 (oz) Discharge Instruction: ***continue to use until prisma arrives at home.****Moisten gauze with Dakin's solution, lightly packing into undermining. Use Anasept gel in clinic. wet to dry gauze Discharge Instruction: saline or KY jelly moisten gauze backing the prisma. Secondary Dressing Bordered Gauze, 4x4 in Discharge Instruction: Apply over primary dressing as directed. Secured With Compression Wrap Compression Stockings Environmental education officer) Signed: 05/11/2021 5:31:46 PM By: [name] Signed: 05/15/2021 1:33:39 PM By: [name] Entered By: [name] on 05/11/2021 14:57:17 -------------------------------------------------------------------------------- Wound Assessment Details Patient Name: Date of Service: Rhonda [name], SA Rhonda G. 05/11/2021 2:00 PM Medical Record Number: [id number] Patient Account Number: [id number] Date of Birth/Sex: Treating RN: 1935/08/16 (86 y.o. [name] Primary Care Robertt Buda: [name] Other Clinician: Referring Tannor Pyon: Treating Erica Osuna/Extender: [name] in Treatment: 11 Wound  Status Wound Number: 2 Primary Etiology: Trauma, Other Wound Location: Left, Medial Lower Leg Wound Status: Healed - Epithelialized Wounding Event: Trauma Comorbid History: Hypertension, Myocardial Infarction, Crohns, Dementia Date Acquired: 04/21/2021 Weeks Of Treatment: 2 Clustered Wound: No Wound Measurements Length: (cm) 0 Width: (cm) 0 Depth: (cm) 0 Area: (cm) 0 Volume: (cm) 0 % Reduction in Area: 100% % Reduction in Volume: 100% Wound Description Classification: Full Thickness Without Exposed Support Structur es Electronic Signature(s) Signed: 05/11/2021 5:31:46 PM By: [name] Entered By: [name] on 05/11/2021 14:52:07 -------------------------------------------------------------------------------- Wound Assessment Details Patient Name: Date of Service: [name], SA Rhonda G. 05/11/2021 2:00 PM Medical Record Number: [id number] Patient Account Number: [id number] Date of Birth/Sex: Treating RN: 1934/12/01 (86 y.o. [name] Primary Care Tamitha Norell: [name] Other Clinician: Referring Merlin Ege: Treating Destiny Hagin/Extender: [name] in Treatment: 11 Wound Status Wound Number: 4 Primary Etiology: Pressure Ulcer Wound Location: Left, Plantar Foot Wound Status: Open Wounding Event: Pressure Injury Comorbid History: Hypertension, Myocardial Infarction, Crohns, Dementia Date Acquired: 05/01/2021 Weeks Of Treatment: 1 Clustered Wound: No Photos Wound Measurements Length: (cm) 2.6 Width: (cm) 3 Depth: (cm) 0.1 Area: (cm) 6.126 Volume: (cm) 0.613 % Reduction in Area: 13.3% % Reduction in Volume: 13.3% Epithelialization: Small (1-33%) Tunneling: No Undermining: No Wound Description Classification: Category/Stage II Wound Margin: Distinct, outline attached Exudate Amount: Medium Exudate Type: Serosanguineous Exudate Color: red, brown Foul Odor After Cleansing: No Slough/Fibrino No Wound Bed Granulation  Amount: None Present (0%) Exposed Structure Necrotic Amount: Large (67-100%) Fascia Exposed: No Necrotic Quality: Eschar Fat Layer (Subcutaneous Tissue) Exposed: Yes Tendon Exposed: No Muscle Exposed: No Joint Exposed: No Bone Exposed: No Treatment Notes Wound #4 (Foot) Wound Laterality: Plantar, Left Cleanser Soap and Water Discharge Instruction: May shower and wash wound with dial antibacterial soap and water prior to dressing change. Peri-Wound Care Topical Primary Dressing Triple Antibiotic Ointment, 0.9 (g) packet Discharge Instruction: Apply to wound bed Secondary Dressing Bordered Gauze, 2x2 in Discharge Instruction: Apply over primary dressing as directed. Secured With Compression Wrap Compression Stockings Environmental education officer) Signed: 05/11/2021 5:31:46 PM By: [name] Signed: 05/15/2021 1:33:39 PM By: [name] Entered By: [name] on 05/11/2021 14:55:56 -------------------------------------------------------------------------------- Wound Assessment Details Patient Name: Date of Service: Rhonda [name], SA Rhonda G. 05/11/2021 2:00 PM Medical Record Number: [id number] Patient Account Number: [id number] Date of Birth/Sex: Treating RN: [birth year] (86 y.o. [name] Primary Care Susa Bones: [name] Other Clinician: Referring Goku Harb: Treating Keiondre Colee/Extender: [name] in Treatment: 11 Wound Status Wound Number: 5 Primary  Etiology: Venous Leg Ulcer Wound Location: Left, Posterior Ankle Wound Status: Healed - Epithelialized Wounding Event: Blister Comorbid History: Hypertension, Myocardial Infarction, Crohns, Dementia Date Acquired: 05/01/2021 Weeks Of Treatment: 1 Clustered Wound: No Wound Measurements Length: (cm) Width: (cm) Depth: (cm) Area: (cm) Volume: (cm) 0 % Reduction in Area: 100% 0 % Reduction in Volume: 100% 0 0 0 Wound Description Classification: Full Thickness  Without Exposed Support Structur es Electronic Signature(s) Signed: 05/11/2021 5:31:46 PM By: [name] Signed: 05/11/2021 5:31:46 PM By: [name] Entered By: [name] on 05/11/2021 14:52:34 -------------------------------------------------------------------------------- Wound Assessment Details Patient Name: Date of Service: Rhonda [name], SA Rhonda G. 05/11/2021 2:00 PM Medical Record Number: [id number] Patient Account Number: [id number] Date of Birth/Sex: Treating RN: 14-Apr-1935 (86 y.o. [name] Primary Care Adeliz Tonkinson: [name] Other Clinician: Referring Verena Shawgo: Treating Kacie Huxtable/Extender: [name] in Treatment: 11 Wound Status Wound Number: 6 Primary Etiology: Pressure Ulcer Wound Location: Right, Lateral Ankle Wound Status: Open Wounding Event: Pressure Injury Comorbid History: Hypertension, Myocardial Infarction, Crohns, Dementia Date Acquired: 05/01/2021 Weeks Of Treatment: 1 Clustered Wound: No Photos Wound Measurements Length: (cm) 0.9 Width: (cm) 0.7 Depth: (cm) 0.1 Area: (cm) 0.495 Volume: (cm) 0.049 % Reduction in Area: -57.6% % Reduction in Volume: 22.2% Epithelialization: None Tunneling: No Undermining: No Wound Description Classification: Category/Stage II Wound Margin: Distinct, outline attached Exudate Amount: Medium Exudate Type: Serosanguineous Exudate Color: red, brown Foul Odor After Cleansing: No Slough/Fibrino Yes Wound Bed Granulation Amount: Large (67-100%) Exposed Structure Granulation Quality: Red Fascia Exposed: No Necrotic Amount: Small (1-33%) Fat Layer (Subcutaneous Tissue) Exposed: Yes Necrotic Quality: Adherent Slough Tendon Exposed: No Muscle Exposed: No Joint Exposed: No Bone Exposed: No Treatment Notes Wound #6 (Ankle) Wound Laterality: Right, Lateral Cleanser Soap and Water Discharge Instruction: May shower and wash wound with dial antibacterial soap and water  prior to dressing change. Peri-Wound Care Topical Primary Dressing Santyl Ointment Discharge Instruction: Apply nickel thick amount to wound bed as instructed Secondary Dressing Bordered Gauze, 2x2 in Discharge Instruction: Apply over primary dressing as directed. Secured With Compression Wrap Compression Stockings Environmental education officer) Signed: 05/11/2021 5:31:46 PM By: [name] Signed: 05/15/2021 1:33:39 PM By: [name] Entered By: [name] on 05/11/2021 14:54:26 -------------------------------------------------------------------------------- Wound Assessment Details Patient Name: Date of Service: Rhonda [name], SA Rhonda G. 05/11/2021 2:00 PM Medical Record Number: [id number] Patient Account Number: [id number] Date of Birth/Sex: Treating RN: 12/14/1934 (86 y.o. [name] Primary Care Gentry Pilson: [name] Other Clinician: Referring Ashyah Quizon: Treating Deontaye Civello/Extender: [name] in Treatment: 11 Wound Status Wound Number: 7 Primary Etiology: Pressure Ulcer Wound Location: Right Calcaneus Wound Status: Open Wounding Event: Pressure Injury Comorbid History: Hypertension, Myocardial Infarction, Crohns, Dementia Date Acquired: 05/11/2021 Weeks Of Treatment: 0 Clustered Wound: No Photos Wound Measurements Length: (cm) 4.5 Width: (cm) 3.3 Depth: (cm) 0.1 Area: (cm) 11.663 Volume: (cm) 1.166 % Reduction in Area: 0% % Reduction in Volume: 0% Epithelialization: None Tunneling: No Undermining: No Wound Description Classification: Unstageable/Unclassified Wound Margin: Distinct, outline attached Exudate Amount: Medium Exudate Type: Serosanguineous Exudate Color: red, brown Wound Bed Granulation Amount: None Present (0%) Necrotic Amount: Large (67-100%) Necrotic Quality: Eschar Foul Odor After Cleansing: No Slough/Fibrino No Exposed Structure Fascia Exposed: No Fat Layer (Subcutaneous Tissue)  Exposed: Yes Tendon Exposed: No Muscle Exposed: No Joint Exposed: No Bone Exposed: No Treatment Notes Wound #7 (Calcaneus) Wound Laterality: Right Cleanser Soap and Water Discharge Instruction: May shower and wash wound with dial antibacterial soap and water prior to dressing  change. Wound Cleanser Discharge Instruction: Cleanse the wound with wound cleanser prior to applying a clean dressing using gauze sponges, not tissue or cotton balls. Peri-Wound Care Topical Primary Dressing triple antibiotic ointment Discharge Instruction: apply triple antibiotic ointment to right heel Secondary Dressing Woven Gauze Sponge, Non-Sterile 4x4 in Discharge Instruction: Apply over primary dressing as directed. ALLEVYN Heel 4 1/2in x 5 1/2in / 10.5cm x 13.5cm Discharge Instruction: May use a abd pad or foam feel cup for protection. Apply over primary dressing as directed. Secured With [employer], 4.5x3.1 (in/yd) Discharge Instruction: Secure with Kerlix as directed. Compression Wrap Compression Stockings Add-Ons Electronic Signature(s) Signed: 05/11/2021 5:31:46 PM By: [name] Signed: 05/15/2021 1:33:39 PM By: [name] Entered By: [name] on 05/11/2021 14:53:30 -------------------------------------------------------------------------------- Vitals Details Patient Name: Date of Service: Rhonda [name], SA Rhonda G. 05/11/2021 2:00 PM Medical Record Number: [id number] Patient Account Number: [id number] Date of Birth/Sex: Treating RN: 05-11-1935 (86 y.o. [name] Primary Care Kataleyah Carducci: [name] Other Clinician: Referring Charese Abundis: Treating Renell Coaxum/Extender: [name] in Treatment: 11 Vital Signs Time Taken: 14:41 Temperature (F): 98.5 Height (in): 63 Pulse (bpm): 79 Weight (lbs): 80 Respiratory Rate (breaths/min): 16 Body Mass Index (BMI): 14.2 Blood Pressure (mmHg): 96/58 Reference Range: 80 - 120 mg /  dl Electronic Signature(s) Signed: 05/15/2021 1:33:39 PM By: [name] Entered By: [name] on 05/11/2021 14:41:45

## 2021-06-01 ENCOUNTER — Encounter (HOSPITAL_BASED_OUTPATIENT_CLINIC_OR_DEPARTMENT_OTHER): Payer: Medicare Other | Admitting: Internal Medicine

## 2021-06-08 ENCOUNTER — Encounter (HOSPITAL_BASED_OUTPATIENT_CLINIC_OR_DEPARTMENT_OTHER): Payer: Medicare Other | Attending: Internal Medicine | Admitting: Internal Medicine

## 2021-06-08 ENCOUNTER — Other Ambulatory Visit: Payer: Self-pay

## 2021-06-08 DIAGNOSIS — L89223 Pressure ulcer of left hip, stage 3: Secondary | ICD-10-CM | POA: Insufficient documentation

## 2021-06-08 DIAGNOSIS — K5090 Crohn's disease, unspecified, without complications: Secondary | ICD-10-CM | POA: Insufficient documentation

## 2021-06-08 DIAGNOSIS — S91302D Unspecified open wound, left foot, subsequent encounter: Secondary | ICD-10-CM | POA: Diagnosis not present

## 2021-06-08 DIAGNOSIS — N189 Chronic kidney disease, unspecified: Secondary | ICD-10-CM | POA: Insufficient documentation

## 2021-06-08 DIAGNOSIS — S91001D Unspecified open wound, right ankle, subsequent encounter: Secondary | ICD-10-CM | POA: Diagnosis not present

## 2021-06-08 DIAGNOSIS — S81802D Unspecified open wound, left lower leg, subsequent encounter: Secondary | ICD-10-CM

## 2021-06-08 DIAGNOSIS — I129 Hypertensive chronic kidney disease with stage 1 through stage 4 chronic kidney disease, or unspecified chronic kidney disease: Secondary | ICD-10-CM | POA: Insufficient documentation

## 2021-06-08 DIAGNOSIS — G309 Alzheimer's disease, unspecified: Secondary | ICD-10-CM | POA: Insufficient documentation

## 2021-06-08 DIAGNOSIS — F0280 Dementia in other diseases classified elsewhere without behavioral disturbance: Secondary | ICD-10-CM | POA: Diagnosis not present

## 2021-06-08 NOTE — Progress Notes (Signed)
[name], [name] ([id number]) Visit Report for 06/08/2021 Debridement Details Patient Name: Date of Service: [name], [place] [name] 06/08/2021 1:00 PM Medical Record Number: [id number] Patient Account Number: [id number] Date of Birth/Sex: Treating RN: Oct 03, 1935 (86 y.o. [name] Primary Care Provider: Diannia Ruder Other Clinician: Referring Provider: Treating Provider/Extender: Dellie Burns in Treatment: 15 Debridement Performed for Assessment: Wound #6 Right,Lateral Ankle Performed By: Clinician Rhae Hammock, RN Debridement Type: Chemical/Enzymatic/Mechanical Agent Used: Santyl Level of Consciousness (Pre-procedure): Awake and Alert Pre-procedure Verification/Time Out Yes - 14:30 Taken: Start Time: 14:30 Bleeding: None Hemostasis Achieved: Pressure Procedural Pain: 0 Post Procedural Pain: 0 Response to Treatment: Procedure was tolerated well Level of Consciousness (Post- Awake and Alert procedure): Post Debridement Measurements of Total Wound Length: (cm) 0.5 Stage: Category/Stage II Width: (cm) 0.5 Depth: (cm) 0.2 Volume: (cm) 0.039 Character of Wound/Ulcer Post Debridement: Requires Further Debridement Post Procedure Diagnosis Same as Pre-procedure Electronic Signature(s) Signed: 06/08/2021 2:41:52 PM By: Kalman Shan DO Signed: 06/08/2021 4:57:56 PM By: Levan Hurst RN, BSN Entered By: Levan Hurst on 06/08/2021 14:34:15 -------------------------------------------------------------------------------- Physical Exam Details Patient Name: Date of Service: MA Murillo Rhonda, [name] 06/08/2021 1:00 PM Medical Record Number: [id number] Patient Account Number: [id number] Date of Birth/Sex: Treating RN: 20-Dec-1934 (86 y.o. [name] Primary Care Provider: Diannia Ruder Other Clinician: Referring Provider: Treating Provider/Extender: Dellie Burns in Treatment: 15 Constitutional respirations  regular, non-labored and within target range for patient.. Cardiovascular 2+ dorsalis pedis/posterior tibialis pulses. Psychiatric pleasant and cooperative. Notes Left trochanter: Open wound with granulation tissue at the opening. Marc Morgans of undermining throughout. No obvious signs of acute infection. Minimal drainage on exam. Left foot: T the plantar aspect open wound with granulation tissue. No signs of infection. o Right ankle: T the lateral aspect there is a small open wound with granulation tissue. No signs of infection. o Electronic Signature(s) Signed: 06/08/2021 2:41:52 PM By: Kalman Shan DO Entered By: Kalman Shan on 06/08/2021 14:37:55 -------------------------------------------------------------------------------- Physician Orders Details Patient Name: Date of Service: MA Murillo Rhonda, [name] 06/08/2021 1:00 PM Medical Record Number: [id number] Patient Account Number: [id number] Date of Birth/Sex: Treating RN: 06/08/1935 (86 y.o. [name] Primary Care Provider: Diannia Ruder Other Clinician: Referring Provider: Treating Provider/Extender: Dellie Burns in Treatment: 15 Verbal / Phone Orders: No Diagnosis Coding ICD-10 Coding Code Description S81.802D Unspecified open wound, left lower leg, subsequent encounter S91.001D Unspecified open wound, right ankle, subsequent encounter S91.302D Unspecified open wound, left foot, subsequent encounter L89.223 Pressure ulcer of left hip, stage 3 G30.9 Alzheimer's disease, unspecified I10 Essential (primary) hypertension N18.9 Chronic kidney disease, unspecified Follow-up Appointments ppointment in 2 weeks. - with Dr. Heber Gueydan Return A Bathing/ Shower/ Hygiene May shower with protection but do not get wound dressing(s) wet. Off-Loading Gel mattress overlay (Group 1) Roho cushion for wheelchair Turn and reposition every 2 hours Home Health No change in wound care orders this week;  continue Home Health for wound care. May utilize formulary equivalent dressing for wound treatment orders unless otherwise specified. Other Home Health Orders/Instructions: - [facility] Wound Treatment Wound #1 - Trochanter Wound Laterality: Left Cleanser: Soap and Water Every Other Day/30 Days Discharge Instructions: May shower and wash wound with dial antibacterial soap and water prior to dressing change. Cleanser: Wound Cleanser (Home Health) (Generic) Every Other Day/30 Days Discharge Instructions: Cleanse the wound with wound cleanser or normal saline prior to applying a clean dressing using gauze sponges, not tissue or cotton balls. Peri-Wound Care:  Zinc Oxide Ointment 30g tube [facility]) Every Other Day/30 Days Discharge Instructions: Apply Zinc Oxide to excoriated areas Prim Dressing: Promogran Prisma Matrix, 4.34 (sq in) (silver collagen) (Home Health) Every Other Day/30 Days ary Discharge Instructions: Moisten collagen with saline or hydrogel/[place] Jelly lightly pack into wound bed. Prim Dressing: wet to dry gauze [facility]) Every Other Day/30 Days ary Discharge Instructions: saline or KY jelly moisten gauze backing the prisma. Secondary Dressing: Bordered Gauze, 4x4 in [facility]) Every Other Day/30 Days Discharge Instructions: Apply over primary dressing as directed. Wound #4 - Foot Wound Laterality: Plantar, Left Cleanser: Soap and Water 1 x Per Day/15 Days Discharge Instructions: May shower and wash wound with dial antibacterial soap and water prior to dressing change. Prim Dressing: Vaseline 1 x Per Day/15 Days ary Discharge Instructions: apply thin layer of Vaseline to area Secondary Dressing: Bordered Gauze, 2x2 in 1 x Per Day/15 Days Discharge Instructions: Apply over primary dressing as directed. Wound #6 - Ankle Wound Laterality: Right, Lateral Cleanser: Soap and Water 1 x Per ZCH/88 Days Discharge Instructions: May shower and wash wound with dial antibacterial soap  and water prior to dressing change. Prim Dressing: Santyl Ointment (Home Health) 1 x Per Day/15 Days ary Discharge Instructions: Apply nickel thick amount to wound bed as instructed Secondary Dressing: Bordered Gauze, 2x2 in ([place]) 1 x Per Day/15 Days Discharge Instructions: Apply over primary dressing as directed. Electronic Signature(s) Signed: 06/08/2021 2:41:52 PM By: Kalman Shan DO Entered By: Kalman Shan on 06/08/2021 14:38:19 -------------------------------------------------------------------------------- Problem List Details Patient Name: Date of Service: [name], [name] 06/08/2021 1:00 PM Medical Record Number: [id number] Patient Account Number: [id number] Date of Birth/Sex: Treating RN: [birth year] (86 y.o. [name] Primary Care Provider: Diannia Ruder Other Clinician: Referring Provider: Treating Provider/Extender: Dellie Burns in Treatment: 15 Active Problems ICD-10 Encounter Code Description Active Date MDM Diagnosis S81.802D Unspecified open wound, left lower leg, subsequent encounter 05/11/2021 No Yes S91.001D Unspecified open wound, right ankle, subsequent encounter 05/11/2021 No Yes S91.302D Unspecified open wound, left foot, subsequent encounter 06/08/2021 No Yes L89.223 Pressure ulcer of left hip, stage 3 02/20/2021 No Yes G30.9 Alzheimer's disease, unspecified 02/20/2021 No Yes I10 Essential (primary) hypertension 02/20/2021 No Yes N18.9 Chronic kidney disease, unspecified 02/20/2021 No Yes Inactive Problems ICD-10 Code Description Active Date Inactive Date S81.802A Unspecified open wound, left lower leg, initial encounter 05/04/2021 05/08/2021 S91.001A Unspecified open wound, right ankle, initial encounter 05/04/2021 05/08/2021 Resolved Problems Electronic Signature(s) Signed: 06/08/2021 2:41:52 PM By: Kalman Shan DO Entered By: Kalman Shan on 06/08/2021  14:29:18 -------------------------------------------------------------------------------- Progress Note Details Patient Name: Date of Service: [name], [name] 06/08/2021 1:00 PM Medical Record Number: [id number] Patient Account Number: [id number] Date of Birth/Sex: Treating RN: 08-04-1935 (86 y.o. [name], [name] Primary Care Provider: Diannia Ruder Other Clinician: Referring Provider: Treating Provider/Extender: Dellie Burns in Treatment: 15 Subjective Objective Constitutional respirations regular, non-labored and within target range for patient.. Vitals Time Taken: 1:50 PM, Height: 63 in, Weight: 80 lbs, BMI: 14.2, Temperature: 97.5 F, Pulse: 84 bpm, Respiratory Rate: 16 breaths/min, Blood Pressure: 127/85 mmHg. Cardiovascular 2+ dorsalis pedis/posterior tibialis pulses. Psychiatric pleasant and cooperative. General Notes: Left trochanter: Open wound with granulation tissue at the opening. Marc Morgans of undermining throughout. No obvious signs of acute infection. Minimal drainage on exam. Left foot: T the plantar aspect open wound with granulation tissue. No signs of infection. Right ankle: T the lateral aspect there is o o a small  open wound with granulation tissue. No signs of infection. Integumentary (Hair, Skin) Wound #1 status is Open. Original cause of wound was Gradually Appeared. The date acquired was: 02/05/2021. The wound has been in treatment 15 weeks. The wound is located on the Left Trochanter. The wound measures 1cm length x 0.7cm width x 0.5cm depth; 0.55cm^2 area and 0.275cm^3 volume. There is Fat Layer (Subcutaneous Tissue) exposed. There is no tunneling noted, however, there is undermining starting at 7:00 and ending at 11:00 with a maximum distance of 5cm. There is a large amount of purulent drainage noted. The wound margin is well defined and not attached to the wound base. There is large (67- 100%) red, pink granulation within the  wound bed. There is no necrotic tissue within the wound bed. Wound #4 status is Open. Original cause of wound was Pressure Injury. The date acquired was: 05/01/2021. The wound has been in treatment 5 weeks. The wound is located on the Francis. The wound measures 1.6cm length x 1.5cm width x 0.1cm depth; 1.885cm^2 area and 0.188cm^3 volume. There is Fat Layer (Subcutaneous Tissue) exposed. There is no tunneling or undermining noted. There is a medium amount of serosanguineous drainage noted. The wound margin is distinct with the outline attached to the wound base. There is large (67-100%) pink granulation within the wound bed. There is no necrotic tissue within the wound bed. Wound #6 status is Open. Original cause of wound was Pressure Injury. The date acquired was: 05/01/2021. The wound has been in treatment 5 weeks. The wound is located on the Right,Lateral Ankle. The wound measures 0.5cm length x 0.5cm width x 0.2cm depth; 0.196cm^2 area and 0.039cm^3 volume. There is Fat Layer (Subcutaneous Tissue) exposed. There is no tunneling or undermining noted. There is a medium amount of serosanguineous drainage noted. The wound margin is distinct with the outline attached to the wound base. There is small (1-33%) pink granulation within the wound bed. There is a large (67-100%) amount of necrotic tissue within the wound bed including Adherent Slough. Wound #7 status is Healed - Epithelialized. Original cause of wound was Pressure Injury. The date acquired was: 05/11/2021. The wound has been in treatment 4 weeks. The wound is located on the Right Calcaneus. The wound measures 0cm length x 0cm width x 0cm depth; 0cm^2 area and 0cm^3 volume. There is no tunneling or undermining noted. There is a none present amount of drainage noted. The wound margin is distinct with the outline attached to the wound base. There is no granulation within the wound bed. There is no necrotic tissue within the wound  bed. Assessment Active Problems ICD-10 Unspecified open wound, left lower leg, subsequent encounter Unspecified open wound, right ankle, subsequent encounter Unspecified open wound, left foot, subsequent encounter Pressure ulcer of left hip, stage 3 Alzheimer's disease, unspecified Essential (primary) hypertension Chronic kidney disease, unspecified Patient has shown improvement to all her wounds. She has 3 wounds currently. I recommended continuing collagen with wet-to-dry dressings to the left trochanter wound. T the ankle wound I recommended continuing Santyl. She had a dried eschar that came off of her plantar left foot a few days ago and I o recommended some Vaseline to this area. No obvious signs of infection. Procedures Wound #6 Pre-procedure diagnosis of Wound #6 is a Pressure Ulcer located on the Right,Lateral Ankle . There was a Chemical/Enzymatic/Mechanical debridement performed by Rhae Hammock, RN.Marland Kitchen Agent used was [employer]. A time out was conducted at 14:30, prior to the start of the procedure.  There was no bleeding. The procedure was tolerated well with a pain level of 0 throughout and a pain level of 0 following the procedure. Post Debridement Measurements: 0.5cm length x 0.5cm width x 0.2cm depth; 0.039cm^3 volume. Post debridement Stage noted as Category/Stage II. Character of Wound/Ulcer Post Debridement requires further debridement. Post procedure Diagnosis Wound #6: Same as Pre-Procedure Plan Follow-up Appointments: Return Appointment in 2 weeks. - with Dr. Heber [place]me Bathing/ Shower/ Hygiene: May shower with protection but do not get wound dressing(s) wet. Off-Loading: Gel mattress overlay (Group 1) Roho cushion for wheelchair Turn and reposition every 2 hours Home Health: No change in wound care orders this week; continue Home Health for wound care. May utilize formulary equivalent dressing for wound treatment orders unless otherwise specified. Other Home  Health Orders/Instructions: Alvis Lemmings WOUND #1: - Trochanter Wound Laterality: Left Cleanser: Soap and Water Every Other Day/30 Days Discharge Instructions: May shower and wash wound with dial antibacterial soap and water prior to dressing change. Cleanser: Wound Cleanser (Home Health) (Generic) Every Other Day/30 Days Discharge Instructions: Cleanse the wound with wound cleanser or normal saline prior to applying a clean dressing using gauze sponges, not tissue or cotton balls. Peri-Wound Care: Zinc Oxide Ointment 30g tube [facility]) Every Other Day/30 Days Discharge Instructions: Apply Zinc Oxide to excoriated areas Prim Dressing: Promogran Prisma Matrix, 4.34 (sq in) (silver collagen) (Home Health) Every Other Day/30 Days ary Discharge Instructions: Moisten collagen with saline or hydrogel/[place] Jelly lightly pack into wound bed. Prim Dressing: wet to dry gauze [facility]) Every Other Day/30 Days ary Discharge Instructions: saline or KY jelly moisten gauze backing the prisma. Secondary Dressing: Bordered Gauze, 4x4 in [facility]) Every Other Day/30 Days Discharge Instructions: Apply over primary dressing as directed. WOUND #4: - Foot Wound Laterality: Plantar, Left Cleanser: Soap and Water 1 x Per Day/15 Days Discharge Instructions: May shower and wash wound with dial antibacterial soap and water prior to dressing change. Prim Dressing: Vaseline 1 x Per Day/15 Days ary Discharge Instructions: apply thin layer of Vaseline to area Secondary Dressing: Bordered Gauze, 2x2 in 1 x Per Day/15 Days Discharge Instructions: Apply over primary dressing as directed. WOUND #6: - Ankle Wound Laterality: Right, Lateral Cleanser: Soap and Water 1 x Per ZYY/48 Days Discharge Instructions: May shower and wash wound with dial antibacterial soap and water prior to dressing change. Prim Dressing: Santyl Ointment (Home Health) 1 x Per Day/15 Days ary Discharge Instructions: Apply nickel thick amount to  wound bed as instructed Secondary Dressing: Bordered Gauze, 2x2 in ([place]) 1 x Per Day/15 Days Discharge Instructions: Apply over primary dressing as directed. 1. Collagen wet-to-dryooleft trochanter, Santyl to the ankle wound and Vaseline to the left plantar wound 2. Follow-up in 2 weeks Electronic Signature(s) Signed: 06/08/2021 2:41:52 PM By: Kalman Shan DO Entered By: Kalman Shan on 06/08/2021 14:40:59 -------------------------------------------------------------------------------- SuperBill Details Patient Name: Date of Service: MA Murillo Rhonda, [name] 06/08/2021 Medical Record Number: [id number] Patient Account Number: [id number] Date of Birth/Sex: Treating RN: December 28, 1934 (85 y.o. [name], [name] Primary Care Provider: Diannia Ruder Other Clinician: Referring Provider: Treating Provider/Extender: Dellie Burns in Treatment: 15 Diagnosis Coding ICD-10 Codes Code Description [phone] Unspecified open wound, left lower leg, subsequent encounter S91.001D Unspecified open wound, right ankle, subsequent encounter S91.302D Unspecified open wound, left foot, subsequent encounter L89.223 Pressure ulcer of left hip, stage 3 G30.9 Alzheimer's disease, unspecified I10 Essential (primary) hypertension N18.9 Chronic kidney disease, unspecified Facility Procedures CPT4 Code: [id number] Description: [phone] -  DEBRIDE W/O ANES NON SELECT Modifier: Quantity: 1 Physician Procedures : CPT4 Code Description Modifier [id number] - WC PHYS LEVEL 3 - EST PT ICD-10 Diagnosis Description S81.802D Unspecified open wound, left lower leg, subsequent encounter S91.302D Unspecified open wound, left foot, subsequent encounter S91.001D  Unspecified open wound, right ankle, subsequent encounter A83.419 Pressure ulcer of left hip, stage 3 Quantity: 1 Electronic Signature(s) Signed: 06/08/2021 2:41:52 PM By: Kalman Shan DO Entered By: Kalman Shan on  06/08/2021 14:41:32

## 2021-06-13 NOTE — Progress Notes (Addendum)
PIPER, HASSEBROCK ([id number]) Visit Report for 06/08/2021 Arrival Information Details Patient Name: Date of Service: [name], [place] [name] 06/08/2021 1:00 PM Medical Record Number: [id number] Patient Account Number: [id number] Date of Birth/Sex: Treating RN: 11/23/1934 (86 y.o. [name] Primary Care Heather Streeper: Clent Demark Other Clinician: Referring Mardy Hoppe: Treating Axelle Szwed/Extender: Dellie Burns in Treatment: 15 Visit Information History Since Last Visit Added or deleted any medications: No Patient Arrived: Wheel Chair Any new allergies or adverse reactions: No Arrival Time: 13:48 Had a fall or experienced change in No Accompanied By: son activities of daily living that may affect Transfer Assistance: None risk of falls: Patient Identification Verified: Yes Signs or symptoms of abuse/neglect since last visito No Secondary Verification Process Completed: Yes Hospitalized since last visit: No Patient Requires Transmission-Based Precautions: No Implantable device outside of the clinic excluding No Patient Has Alerts: No cellular tissue based products placed in the center since last visit: Has Dressing in Place as Prescribed: Yes Pain Present Now: No Electronic Signature(s) Signed: 06/08/2021 4:57:56 PM By: Levan Hurst RN, BSN Entered By: Levan Hurst on 06/08/2021 13:50:52 -------------------------------------------------------------------------------- Complex / Palliative Patient Assessment Details Patient Name: Date of Service: [name], [name] 06/08/2021 1:00 PM Medical Record Number: [id number] Patient Account Number: [id number] Date of Birth/Sex: Treating RN: 01-24-1935 (86 y.o. [name] Primary Care Janith Nielson: Clent Demark Other Clinician: Referring Kylar Leonhardt: Treating Daylynn Stumpp/Extender: Dellie Burns in Treatment: 15 Palliative Management Criteria Complex Wound Management Criteria Patient  has remarkable or complex co-morbidities requiring medications or treatments that extend wound healing times. Examples: Diabetes mellitus with chronic renal failure or end stage renal disease requiring dialysis Advanced or poorly controlled rheumatoid arthritis Diabetes mellitus and end stage chronic obstructive pulmonary disease Active cancer with current chemo- or radiation therapy Alzheimer's, CKD-4 Care Approach Wound Care Plan: Complex Wound Management Electronic Signature(s) Signed: 06/23/2021 12:46:18 PM By: Kalman Shan DO Signed: 07/13/2021 4:50:18 PM By: Levan Hurst RN, BSN Entered By: Levan Hurst on 06/22/2021 08:30:04 -------------------------------------------------------------------------------- Encounter Discharge Information Details Patient Name: Date of Service: [name], [name] 06/08/2021 1:00 PM Medical Record Number: [id number] Patient Account Number: [id number] Date of Birth/Sex: Treating RN: [birth year] (86 y.o. [name] Primary Care Jailyne Chieffo: Clent Demark Other Clinician: Referring Mckinzi Eriksen: Treating Derold Dorsch/Extender: Dellie Burns in Treatment: 15 Encounter Discharge Information Items Post Procedure Vitals Discharge Condition: Stable Temperature (F): 97.5 Ambulatory Status: Wheelchair Pulse (bpm): 84 Discharge Destination: Home Respiratory Rate (breaths/min): 16 Transportation: Private Auto Blood Pressure (mmHg): 127/85 Accompanied By: son Schedule Follow-up Appointment: Yes Clinical Summary of Care: Patient Declined Electronic Signature(s) Signed: 06/08/2021 4:57:56 PM By: Levan Hurst RN, BSN Entered By: Levan Hurst on 06/08/2021 15:27:43 -------------------------------------------------------------------------------- Lower Extremity Assessment Details Patient Name: Date of Service: [name], [name] 06/08/2021 1:00 PM Medical Record Number: [id number] Patient Account Number: [id number] Date of  Birth/Sex: Treating RN: [birth year] (86 y.o. [name] Primary Care Ilia Engelbert: Clent Demark Other Clinician: Referring Ezekial Arns: Treating Grae Cannata/Extender: Dellie Burns in Treatment: 15 Edema Assessment Assessed: [Left: No] [name]: No] Edema: [Left: No] [Right: No] Calf Left: Right: Point of Measurement: From Medial Instep 20 cm 20 cm Ankle Left: Right: Point of Measurement: From Medial Instep 17 cm 17 cm Vascular Assessment Pulses: Dorsalis Pedis Palpable: [Left:Yes] [Right:Yes] Electronic Signature(s) Signed: 06/08/2021 4:57:56 PM By: Levan Hurst RN, BSN Entered By: Levan Hurst on 06/08/2021 14:04:04 -------------------------------------------------------------------------------- Multi Wound Chart Details Patient Name: Date of  Service: [name], [name] 06/08/2021 1:00 PM Medical Record Number: [id number] Patient Account Number: [id number] Date of Birth/Sex: Treating RN: 21-Feb-1935 (86 y.o. [name], [name] Primary Care Armistead Sult: Clent Demark Other Clinician: Referring Lars Jeziorski: Treating Morganna Styles/Extender: Dellie Burns in Treatment: 15 Vital Signs Height(in): 47 Pulse(bpm): 2 Weight(lbs): 4 Blood Pressure(mmHg): 127/85 Body Mass Index(BMI): 14 Temperature(F): 97.5 Respiratory Rate(breaths/min): 16 Photos: Left Trochanter Left, Plantar Foot Right, Lateral Ankle Wound Location: Gradually Appeared Pressure Injury Pressure Injury Wounding Event: Pressure Ulcer Pressure Ulcer Pressure Ulcer Primary Etiology: Hypertension, Myocardial Infarction, Hypertension, Myocardial Infarction, Hypertension, Myocardial Infarction, Comorbid History: Crohns, Dementia Crohns, Dementia Crohns, Dementia 02/05/2021 05/01/2021 05/01/2021 Date Acquired: 15 5 5  Weeks of Treatment: Open Open Open Wound Status: 1x0.7x0.5 1.6x1.5x0.1 0.5x0.5x0.2 Measurements L x W x D (cm) 0.55 1.885 0.196 A (cm)  : rea 0.275 0.188 0.039 Volume (cm) : 95.30% 73.30% 37.60% % Reduction in A rea: 98.80% 73.40% 38.10% % Reduction in Volume: 7 Starting Position 1 (o'clock): 11 Ending Position 1 (o'clock): 5 Maximum Distance 1 (cm): Yes No No Undermining: Category/Stage III Category/Stage II Category/Stage II Classification: Large Medium Medium Exudate A mount: Purulent Serosanguineous Serosanguineous Exudate Type: yellow, brown, green red, brown red, brown Exudate Color: Well defined, not attached Distinct, outline attached Distinct, outline attached Wound Margin: Large (67-100%) Large (67-100%) Small (1-33%) Granulation A mount: Red, Pink Pink Pink Granulation Quality: None Present (0%) None Present (0%) Large (67-100%) Necrotic A mount: Fat Layer (Subcutaneous Tissue): Yes Fat Layer (Subcutaneous Tissue): Yes Fat Layer (Subcutaneous Tissue): Yes Exposed Structures: Fascia: No Fascia: No Fascia: No Tendon: No Tendon: No Tendon: No Muscle: No Muscle: No Muscle: No Joint: No Joint: No Joint: No Bone: No Bone: No Bone: No Small (1-33%) Small (1-33%) None Epithelialization: Wound Number: 7 N/A N/A Photos: N/A N/A Right Calcaneus N/A N/A Wound Location: Pressure Injury N/A N/A Wounding Event: Pressure Ulcer N/A N/A Primary Etiology: Hypertension, Myocardial Infarction, N/A N/A Comorbid History: Crohns, Dementia 05/11/2021 N/A N/A Date Acquired: 4 N/A N/A Weeks of Treatment: Healed - Epithelialized N/A N/A Wound Status: 0x0x0 N/A N/A Measurements L x W x D (cm) 0 N/A N/A A (cm) : rea 0 N/A N/A Volume (cm) : 100.00% N/A N/A % Reduction in A rea: 100.00% N/A N/A % Reduction in Volume: No N/A N/A Undermining: Unstageable/Unclassified N/A N/A Classification: None Present N/A N/A Exudate A mount: N/A N/A N/A Exudate Type: N/A N/A N/A Exudate Color: Distinct, outline attached N/A N/A Wound Margin: None Present (0%) N/A N/A Granulation A mount: N/A  N/A N/A Granulation Quality: None Present (0%) N/A N/A Necrotic A mount: Fascia: No N/A N/A Exposed Structures: Fat Layer (Subcutaneous Tissue): No Tendon: No Muscle: No Joint: No Bone: No Large (67-100%) N/A N/A Epithelialization: Treatment Notes Electronic Signature(s) Signed: 06/08/2021 2:41:52 PM By: Kalman Shan DO Signed: 06/13/2021 5:24:19 PM By: Rhae Hammock RN Entered By: Kalman Shan on 06/08/2021 14:29:43 -------------------------------------------------------------------------------- Multi-Disciplinary Care Plan Details Patient Name: Date of Service: [name], [name] 06/08/2021 1:00 PM Medical Record Number: [id number] Patient Account Number: [id number] Date of Birth/Sex: Treating RN: 04/22/1935 (86 y.o. [name] Primary Care Elcie Pelster: Clent Demark Other Clinician: Referring Lexx Monte: Treating [employer] Teegarden/Extender: Dellie Burns in Treatment: [place] reviewed with physician Active Inactive Pressure Nursing Diagnoses: Knowledge deficit related to causes and risk factors for pressure ulcer development Knowledge deficit related to management of pressures ulcers Potential for impaired tissue integrity related to pressure, friction, moisture, and shear  Goals: Patient/caregiver will verbalize risk factors for pressure ulcer development Date Initiated: 02/20/2021 Target Resolution Date: 07/07/2021 Goal Status: Active Patient/caregiver will verbalize understanding of pressure ulcer management Date Initiated: 02/20/2021 Date Inactivated: 03/23/2021 Target Resolution Date: 03/24/2021 Goal Status: Met Interventions: Assess: immobility, friction, shearing, incontinence upon admission and as needed Assess offloading mechanisms upon admission and as needed Assess potential for pressure ulcer upon admission and as needed Provide education on pressure ulcers Notes: Wound/Skin Impairment Nursing  Diagnoses: Impaired tissue integrity Knowledge deficit related to ulceration/compromised skin integrity Goals: Patient/caregiver will verbalize understanding of skin care regimen Date Initiated: 02/20/2021 Target Resolution Date: 07/07/2021 Goal Status: Active Ulcer/skin breakdown will have a volume reduction of 30% by week 4 Date Initiated: 02/20/2021 Date Inactivated: 03/23/2021 Target Resolution Date: 03/24/2021 Unmet Reason: deeper according to Goal Status: Unmet wound care measurements. Interventions: Assess patient/caregiver ability to obtain necessary supplies Assess patient/caregiver ability to perform ulcer/skin care regimen upon admission and as needed Assess ulceration(s) every visit Provide education on ulcer and skin care Notes: Electronic Signature(s) Signed: 06/08/2021 4:57:56 PM By: Levan Hurst RN, BSN Entered By: Levan Hurst on 06/08/2021 14:17:03 -------------------------------------------------------------------------------- Pain Assessment Details Patient Name: Date of Service: [name], [name] 06/08/2021 1:00 PM Medical Record Number: [id number] Patient Account Number: [id number] Date of Birth/Sex: Treating RN: August 08, 1935 (86 y.o. [name] Primary Care Valeta Paz: Clent Demark Other Clinician: Referring Kenyada Dosch: Treating Decarla Siemen/Extender: Dellie Burns in Treatment: 15 Active Problems Location of Pain Severity and Description of Pain Patient Has Paino No Site Locations Pain Management and Medication Current Pain Management: Electronic Signature(s) Signed: 06/08/2021 4:57:56 PM By: Levan Hurst RN, BSN Entered By: Levan Hurst on 06/08/2021 13:51:19 -------------------------------------------------------------------------------- Patient/Caregiver Education Details Patient Name: Date of Service: [place] Shelbie Proctor DIE Darnell Level 8/25/2022andnbsp1:00 PM Medical Record Number: [id number] Patient Account Number:  [id number] Date of Birth/Gender: Treating RN: [birth year] (85 y.o. [name] Primary Care Physician: Clent Demark Other Clinician: Referring Physician: Treating Physician/Extender: Dellie Burns in Treatment: 15 Education Assessment Education Provided To: Patient Education Topics Provided Wound/Skin Impairment: Methods: Explain/Verbal Responses: State content correctly [employer]) Signed: 06/08/2021 4:57:56 PM By: Levan Hurst RN, BSN Entered By: Levan Hurst on 06/08/2021 14:12:52 -------------------------------------------------------------------------------- Wound Assessment Details Patient Name: Date of Service: [name], [name] 06/08/2021 1:00 PM Medical Record Number: [id number] Patient Account Number: [id number] Date of Birth/Sex: Treating RN: [birth year] (86 y.o. [name] Primary Care Dorwin Fitzhenry: Clent Demark Other Clinician: Referring Helmi Hechavarria: Treating Quamaine Webb/Extender: Dellie Burns in Treatment: 15 Wound Status Wound Number: 1 Primary Etiology: Pressure Ulcer Wound Location: Left Trochanter Wound Status: Open Wounding Event: Gradually Appeared Comorbid History: Hypertension, Myocardial Infarction, Crohns, Dementia Date Acquired: 02/05/2021 Weeks Of Treatment: 15 Clustered Wound: No Photos Wound Measurements Length: (cm) 1 Width: (cm) 0.7 Depth: (cm) 0.5 Area: (cm) 0.55 Volume: (cm) 0.275 % Reduction in Area: 95.3% % Reduction in Volume: 98.8% Epithelialization: Small (1-33%) Tunneling: No Undermining: Yes Starting Position (o'clock): 7 Ending Position (o'clock): 11 Maximum Distance: (cm) 5 Wound Description Classification: Category/Stage III Wound Margin: Well defined, not attached Exudate Amount: Large Exudate Type: Purulent Exudate Color: yellow, brown, green Foul Odor After Cleansing: No Slough/Fibrino No Wound Bed Granulation Amount: Large  (67-100%) Exposed Structure Granulation Quality: Red, Pink Fascia Exposed: No Necrotic Amount: None Present (0%) Fat Layer (Subcutaneous Tissue) Exposed: Yes Tendon Exposed: No Muscle Exposed: No Joint Exposed: No Bone Exposed: No Electronic Signature(s) Signed: 06/08/2021 4:57:56 PM By: Levan Hurst RN,  BSN Signed: 06/12/2021 3:13:54 PM By: Sandre Kitty Entered By: Sandre Kitty on 06/08/2021 14:08:07 -------------------------------------------------------------------------------- Wound Assessment Details Patient Name: Date of Service: [name], [name] 06/08/2021 1:00 PM Medical Record Number: [id number] Patient Account Number: [id number] Date of Birth/Sex: Treating RN: [birth year] (86 y.o. [name] Primary Care Aymara Sassi: Clent Demark Other Clinician: Referring Jerrard Bradburn: Treating Kelleigh Skerritt/Extender: Dellie Burns in Treatment: 15 Wound Status Wound Number: 4 Primary Etiology: Pressure Ulcer Wound Location: Left, Plantar Foot Wound Status: Open Wounding Event: Pressure Injury Comorbid History: Hypertension, Myocardial Infarction, Crohns, Dementia Date Acquired: 05/01/2021 Weeks Of Treatment: 5 Clustered Wound: No Photos Wound Measurements Length: (cm) 1.6 Width: (cm) 1.5 Depth: (cm) 0.1 Area: (cm) 1.885 Volume: (cm) 0.188 % Reduction in Area: 73.3% % Reduction in Volume: 73.4% Epithelialization: Small (1-33%) Tunneling: No Undermining: No Wound Description Classification: Category/Stage II Wound Margin: Distinct, outline attached Exudate Amount: Medium Exudate Type: Serosanguineous Exudate Color: red, brown Foul Odor After Cleansing: No Slough/Fibrino No Wound Bed Granulation Amount: Large (67-100%) Exposed Structure Granulation Quality: Pink Fascia Exposed: No Necrotic Amount: None Present (0%) Fat Layer (Subcutaneous Tissue) Exposed: Yes Tendon Exposed: No Muscle Exposed: No Joint Exposed: No Bone  Exposed: No Electronic Signature(s) Signed: 06/08/2021 4:57:56 PM By: Levan Hurst RN, BSN Signed: 06/12/2021 3:13:54 PM By: Sandre Kitty Entered By: Sandre Kitty on 06/08/2021 14:09:18 -------------------------------------------------------------------------------- Wound Assessment Details Patient Name: Date of Service: [name], [name] 06/08/2021 1:00 PM Medical Record Number: [id number] Patient Account Number: [id number] Date of Birth/Sex: Treating RN: 10/29/1934 (86 y.o. [name] Primary Care Juanmanuel Marohl: Clent Demark Other Clinician: Referring Oliviana Mcgahee: Treating Maxxon Schwanke/Extender: Dellie Burns in Treatment: 15 Wound Status Wound Number: 6 Primary Etiology: Pressure Ulcer Wound Location: Right, Lateral Ankle Wound Status: Open Wounding Event: Pressure Injury Comorbid History: Hypertension, Myocardial Infarction, Crohns, Dementia Date Acquired: 05/01/2021 Weeks Of Treatment: 5 Clustered Wound: No Photos Wound Measurements Length: (cm) 0.5 Width: (cm) 0.5 Depth: (cm) 0.2 Area: (cm) 0.196 Volume: (cm) 0.039 Wound Description Classification: Category/Stage II Wound Margin: Distinct, outline attached Exudate Amount: Medium Exudate Type: Serosanguineous Exudate Color: red, brown Foul Odor After Cleansing: No Slough/Fibrino Ye % Reduction in Area: 37.6% % Reduction in Volume: 38.1% Epithelialization: None Tunneling: No Undermining: No s Wound Bed Granulation Amount: Small (1-33%) Exposed Structure Granulation Quality: Pink Fascia Exposed: No Necrotic Amount: Large (67-100%) Fat Layer (Subcutaneous Tissue) Exposed: Yes Necrotic Quality: Adherent Slough Tendon Exposed: No Muscle Exposed: No Joint Exposed: No Bone Exposed: No Electronic Signature(s) Signed: 06/08/2021 4:57:56 PM By: Levan Hurst RN, BSN Signed: 06/12/2021 3:13:54 PM By: Sandre Kitty Entered By: Sandre Kitty on 06/08/2021  14:11:40 -------------------------------------------------------------------------------- Wound Assessment Details Patient Name: Date of Service: [name], [name] 06/08/2021 1:00 PM Medical Record Number: [id number] Patient Account Number: [id number] Date of Birth/Sex: Treating RN: [birth year] (86 y.o. [name] Primary Care Kjersti Dittmer: Clent Demark Other Clinician: Referring Javontae Marlette: Treating Nikola Marone/Extender: Dellie Burns in Treatment: 15 Wound Status Wound Number: 7 Primary Etiology: Pressure Ulcer Wound Location: Right Calcaneus Wound Status: Healed - Epithelialized Wounding Event: Pressure Injury Comorbid History: Hypertension, Myocardial Infarction, Crohns, Dementia Date Acquired: 05/11/2021 Weeks Of Treatment: 4 Clustered Wound: No Photos Wound Measurements Length: (cm) Width: (cm) Depth: (cm) Area: (cm) Volume: (cm) 0 % Reduction in Area: 100% 0 % Reduction in Volume: 100% 0 Epithelialization: Large (67-100%) 0 Tunneling: No 0 Undermining: No Wound Description Classification: Unstageable/Unclassified Wound Margin: Distinct, outline attached Exudate Amount: None Present Foul  Odor After Cleansing: No Slough/Fibrino No Wound Bed Granulation Amount: None Present (0%) Exposed Structure Necrotic Amount: None Present (0%) Fascia Exposed: No Fat Layer (Subcutaneous Tissue) Exposed: No Tendon Exposed: No Muscle Exposed: No Joint Exposed: No Bone Exposed: No Electronic Signature(s) Signed: 06/08/2021 4:57:56 PM By: Levan Hurst RN, BSN Entered By: Levan Hurst on 06/08/2021 14:18:54 -------------------------------------------------------------------------------- [place] Details Patient Name: Date of Service: [name], [name] 06/08/2021 1:00 PM Medical Record Number: [id number] Patient Account Number: [id number] Date of Birth/Sex: Treating RN: 1935/01/27 (86 y.o. [name] Primary Care Rosalee Tolley: Clent Demark Other Clinician: Referring Orphia Mctigue: Treating Cailah Reach/Extender: Dellie Burns in Treatment: 15 Vital Signs Time Taken: 13:50 Temperature (F): 97.5 Height (in): 63 Pulse (bpm): 84 Weight (lbs): 80 Respiratory Rate (breaths/min): 16 Body Mass Index (BMI): 14.2 Blood Pressure (mmHg): 127/85 Reference Range: 80 - 120 mg / dl Electronic Signature(s) Signed: 06/08/2021 4:57:56 PM By: Levan Hurst RN, BSN Entered By: Levan Hurst on 06/08/2021 13:51:14

## 2021-06-22 ENCOUNTER — Encounter (HOSPITAL_BASED_OUTPATIENT_CLINIC_OR_DEPARTMENT_OTHER): Payer: Medicare Other | Admitting: Internal Medicine

## 2021-06-29 ENCOUNTER — Encounter (HOSPITAL_BASED_OUTPATIENT_CLINIC_OR_DEPARTMENT_OTHER): Payer: Medicare Other | Admitting: Internal Medicine

## 2021-06-29 ENCOUNTER — Other Ambulatory Visit: Payer: Self-pay

## 2021-07-03 ENCOUNTER — Encounter (HOSPITAL_BASED_OUTPATIENT_CLINIC_OR_DEPARTMENT_OTHER): Payer: Medicare Other | Attending: Internal Medicine | Admitting: Internal Medicine

## 2021-07-03 ENCOUNTER — Other Ambulatory Visit: Payer: Self-pay

## 2021-07-03 DIAGNOSIS — L89223 Pressure ulcer of left hip, stage 3: Secondary | ICD-10-CM | POA: Insufficient documentation

## 2021-07-03 DIAGNOSIS — S81802D Unspecified open wound, left lower leg, subsequent encounter: Secondary | ICD-10-CM | POA: Insufficient documentation

## 2021-07-03 DIAGNOSIS — X58XXXD Exposure to other specified factors, subsequent encounter: Secondary | ICD-10-CM | POA: Insufficient documentation

## 2021-07-03 DIAGNOSIS — I252 Old myocardial infarction: Secondary | ICD-10-CM | POA: Insufficient documentation

## 2021-07-03 DIAGNOSIS — I129 Hypertensive chronic kidney disease with stage 1 through stage 4 chronic kidney disease, or unspecified chronic kidney disease: Secondary | ICD-10-CM | POA: Insufficient documentation

## 2021-07-03 DIAGNOSIS — F0280 Dementia in other diseases classified elsewhere without behavioral disturbance: Secondary | ICD-10-CM | POA: Insufficient documentation

## 2021-07-03 DIAGNOSIS — S91302D Unspecified open wound, left foot, subsequent encounter: Secondary | ICD-10-CM | POA: Diagnosis not present

## 2021-07-03 DIAGNOSIS — I2510 Atherosclerotic heart disease of native coronary artery without angina pectoris: Secondary | ICD-10-CM | POA: Diagnosis not present

## 2021-07-03 DIAGNOSIS — N184 Chronic kidney disease, stage 4 (severe): Secondary | ICD-10-CM | POA: Insufficient documentation

## 2021-07-03 DIAGNOSIS — K5090 Crohn's disease, unspecified, without complications: Secondary | ICD-10-CM | POA: Diagnosis not present

## 2021-07-03 DIAGNOSIS — G309 Alzheimer's disease, unspecified: Secondary | ICD-10-CM | POA: Diagnosis not present

## 2021-07-03 DIAGNOSIS — E785 Hyperlipidemia, unspecified: Secondary | ICD-10-CM | POA: Insufficient documentation

## 2021-07-03 DIAGNOSIS — S91001D Unspecified open wound, right ankle, subsequent encounter: Secondary | ICD-10-CM | POA: Insufficient documentation

## 2021-07-03 DIAGNOSIS — L89213 Pressure ulcer of right hip, stage 3: Secondary | ICD-10-CM | POA: Diagnosis not present

## 2021-07-03 NOTE — Progress Notes (Signed)
[name], [name] ([id number]) Visit Report for 07/03/2021 Chief Complaint Document Details Patient Name: Date of Service: [name],  [name] 07/03/2021 3:00 PM Medical Record Number: [id number] Patient Account Number: [id number] Date of Birth/Sex: Treating RN: 1935/01/16 (87 y.o. [name] Primary Care Provider: Clent Demark Other Clinician: Referring Provider: Treating Provider/Extender: Dellie Burns in Treatment: 75 Information Obtained from: Patient Chief Complaint Left hip wound and Right lateral ankle wound Electronic Signature(s) Signed: 07/03/2021 4:36:47 PM By: Kalman Shan DO Entered By: Kalman Shan on 07/03/2021 16:29:41 -------------------------------------------------------------------------------- HPI Details Patient Name: Date of Service: [name], Rhonda [name] 07/03/2021 3:00 PM Medical Record Number: [id number] Patient Account Number: [id number] Date of Birth/Sex: Treating RN: [birth year] (86 y.o. [name] Primary Care Provider: Clent Demark Other Clinician: Referring Provider: Treating Provider/Extender: Dellie Burns in Treatment: 40 History of Present Illness HPI Description: Admission 5/9 [name] is an 86 year old female with a past medical history of hypertension, hyperlipidemia, CAD s/p MI, and Alzheimer's Disease that presents to the clinic today for A left hip wound. The son is present and helps provide the history. This was noticed about 2 weeks ago and there was a scab to the area. When this was removed there was depth to the wound. It has gotten slightly larger over the past 3 days. Patient denies any fever/chills, nausea/vomiting and states she has been her usual state of health. Per son she has ample at home care . She has an aide that comes in 5 days a week. She is able to walk with support for short distances. This is limited due to her advanced dementia. She usually stays  in a chair for most of the day and then transfers to a bed at night. 5/19; patient presents for 1 week follow-up. Son was present and helped provide the history. She has been taking Augmentin without any issues. They have been applying Santyl daily to the wound. No complaints today. 6/2; patient presents for 1 week follow-up. Son is present and helps provide the history. She has been using Dakin's wet-to-dry. She states she feels well and denies any signs of infection. 6/9; patient presents for 1 week follow-up. Son is present and helps provide the history. He continues to do Dakin's wet-to-dry. Overall patient feels well and denies signs of infection. She is using her airflow mattress for about 12 hours a day. 6/23; patient presents for 2-week follow-up. Son is present. He has been using Dakin's wet-to-dry and has noticed improvement in the wound. There are no issues or complaints today. She denies signs of infection. 7/8; patient presents for 2-week follow-up. Son is present. She has been using silver alginate every other day to the wound bed. Unfortunately she has had some excoriation to the surrounding skin. She also developed 2 skin tears to her left leg and is not quite sure how this happened. She denies signs of infection. 7/21; patient presents for 2-week follow-up. Son is present. She has been using Dakin wet-to-dry dressings daily. There has been improvement in the odor and drainage. No obvious signs of infection. 7/28; patient presents for 1 week follow-up. There has been a significant decrease in odor and drainage since last week with the continued use of Dakin's wet- to-dry dressings. Patient and son would like to try a new dressing to see if this will help stimulate healing. 8/25; patient presents for follow-up. There are no issues or complaints today. Patient denies signs of  infection. 9/19; patient presents for follow-up. Son reports improvement in wound healing to the left trochanter  wound with collagen. Patient's been using Vaseline to the left foot wound and Santyl to the right lateral ankle wound. There are no issues or complaints today. Electronic Signature(s) Signed: 07/03/2021 4:36:47 PM By: Kalman Shan DO Entered By: Kalman Shan on 07/03/2021 16:32:53 -------------------------------------------------------------------------------- Physical Exam Details Patient Name: Date of Service: [name], Rhonda [name] 07/03/2021 3:00 PM Medical Record Number: [id number] Patient Account Number: [id number] Date of Birth/Sex: Treating RN: May 08, 1935 (86 y.o. [name] Primary Care Provider: Clent Demark Other Clinician: Referring Provider: Treating Provider/Extender: Dellie Burns in Treatment: 19 Constitutional respirations regular, non-labored and within target range for patient.Marland Kitchen Psychiatric pleasant and cooperative. Notes Left trochanter: Open wound with granulation tissue at the opening. Marc Morgans of undermining throughout. No obvious signs of acute infection. Minimal serosanguinous drainage on exam. Left foot: T the plantar aspect scab noted. No signs of infection. Right ankle: T the lateral aspect there is a small open o o wound with granulation tissue. No signs of infection. Electronic Signature(s) Signed: 07/03/2021 4:36:47 PM By: Kalman Shan DO Entered By: Kalman Shan on 07/03/2021 16:33:53 -------------------------------------------------------------------------------- Physician Orders Details Patient Name: Date of Service: [name], Rhonda [name] 07/03/2021 3:00 PM Medical Record Number: [id number] Patient Account Number: [id number] Date of Birth/Sex: Treating RN: 01-11-1935 (86 y.o. [name] Primary Care Provider: Clent Demark Other Clinician: Referring Provider: Treating Provider/Extender: Dellie Burns in Treatment: 62 Verbal / Phone Orders: No Diagnosis Coding ICD-10  Coding Code Description S81.802D Unspecified open wound, left lower leg, subsequent encounter S91.001D Unspecified open wound, right ankle, subsequent encounter S91.302D Unspecified open wound, left foot, subsequent encounter L89.223 Pressure ulcer of left hip, stage 3 G30.9 Alzheimer's disease, unspecified I10 Essential (primary) hypertension N18.9 Chronic kidney disease, unspecified Follow-up Appointments ppointment in 2 weeks. - with Dr. Heber [place] Return A Bathing/ Shower/ Hygiene May shower with protection but do not get wound dressing(s) wet. Off-Loading Gel mattress overlay (Group 1) Roho cushion for wheelchair Turn and reposition every 2 hours Home Health No change in wound care orders this week; continue Home Health for wound care. May utilize formulary equivalent dressing for wound treatment orders unless otherwise specified. Other Home Health Orders/Instructions: Alvis Lemm[facility] Wound Treatment Wound #1 - Trochanter Wound Laterality: Left Cleanser: Soap and Water Every Other Day/30 Days Discharge Instructions: May shower and wash wound with dial antibacterial soap and water prior to dressing change. Cleanser: Wound Cleanser (Home Health) (Generic) Every Other Day/30 Days Discharge Instructions: Cleanse the wound with wound cleanser or normal saline prior to applying a clean dressing using gauze sponges, not tissue or cotton balls. Peri-Wound Care: Zinc Oxide Ointment 30g tube [facility]) Every Other Day/30 Days Discharge Instructions: Apply Zinc Oxide to excoriated areas Prim Dressing: Promogran Prisma Matrix, 4.34 (sq in) (silver collagen) (Home Health) Every Other Day/30 Days ary Discharge Instructions: Moisten collagen with saline or hydrogel/KY Jelly lightly pack into wound bed. Prim Dressing: wet to dry gauze [facility]) Every Other Day/30 Days ary Discharge Instructions: saline or KY jelly moisten gauze backing the prisma. Secondary Dressing: Bordered Gauze, 4x4 in  [facility]) Every Other Day/30 Days Discharge Instructions: Apply over primary dressing as directed. Wound #4 - Foot Wound Laterality: Plantar, Left Cleanser: Soap and Water 1 x Per Day/15 Days Discharge Instructions: May shower and wash wound with dial antibacterial soap and water prior to dressing change. Prim Dressing:  Vaseline 1 x Per Day/15 Days ary Discharge Instructions: apply thin layer of Vaseline to area Secondary Dressing: Bordered Gauze, 2x2 in 1 x Per Day/15 Days Discharge Instructions: Apply over primary dressing as directed. Wound #6 - Ankle Wound Laterality: Right, Lateral Cleanser: Soap and Water 1 x Per HBZ/16 Days Discharge Instructions: May shower and wash wound with dial antibacterial soap and water prior to dressing change. Prim Dressing: Santyl Ointment (Home Health) 1 x Per Day/15 Days ary Discharge Instructions: Apply nickel thick amount to wound bed as instructed Secondary Dressing: Bordered Gauze, 2x2 in ([place]) 1 x Per Day/15 Days Discharge Instructions: Apply over primary dressing as directed. Electronic Signature(s) Signed: 07/03/2021 4:36:47 PM By: Kalman Shan DO Entered By: Kalman Shan on 07/03/2021 16:34:09 -------------------------------------------------------------------------------- Problem List Details Patient Name: Date of Service: [name], Rhonda [name] 07/03/2021 3:00 PM Medical Record Number: [id number] Patient Account Number: [id number] Date of Birth/Sex: Treating RN: 1935-07-16 (86 y.o. [name] Primary Care Provider: Clent Demark Other Clinician: Referring Provider: Treating Provider/Extender: Dellie Burns in Treatment: 19 Active Problems ICD-10 Encounter Code Description Active Date MDM Diagnosis S81.802D Unspecified open wound, left lower leg, subsequent encounter 05/11/2021 No Yes S91.001D Unspecified open wound, right ankle, subsequent encounter 05/11/2021 No Yes S91.302D  Unspecified open wound, left foot, subsequent encounter 06/08/2021 No Yes L89.223 Pressure ulcer of left hip, stage 3 02/20/2021 No Yes G30.9 Alzheimer's disease, unspecified 02/20/2021 No Yes I10 Essential (primary) hypertension 02/20/2021 No Yes N18.9 Chronic kidney disease, unspecified 02/20/2021 No Yes Inactive Problems ICD-10 Code Description Active Date Inactive Date S81.802A Unspecified open wound, left lower leg, initial encounter 05/04/2021 05/08/2021 S91.001A Unspecified open wound, right ankle, initial encounter 05/04/2021 05/08/2021 Resolved Problems Electronic Signature(s) Signed: 07/03/2021 4:36:47 PM By: Kalman Shan DO Entered By: Kalman Shan on 07/03/2021 16:29:08 -------------------------------------------------------------------------------- Progress Note Details Patient Name: Date of Service: [name], Rhonda [name] 07/03/2021 3:00 PM Medical Record Number: [id number] Patient Account Number: [id number] Date of Birth/Sex: Treating RN: 1935-05-10 (86 y.o. [name] Primary Care Provider: Clent Demark Other Clinician: Referring Provider: Treating Provider/Extender: Dellie Burns in Treatment: 69 Subjective Chief Complaint Information obtained from Patient Left hip wound and Right lateral ankle wound History of Present Illness (HPI) Admission 5/9 [name] is an 86 year old female with a past medical history of hypertension, hyperlipidemia, CAD s/p MI, and Alzheimer's Disease that presents to the clinic today for A left hip wound. The son is present and helps provide the history. This was noticed about 2 weeks ago and there was a scab to the area. When this was removed there was depth to the wound. It has gotten slightly larger over the past 3 days. Patient denies any fever/chills, nausea/vomiting and states she has been her usual state of health. Per son she has ample at home care . She has an aide that comes in 5 days a week. She is  able to walk with support for short distances. This is limited due to her advanced dementia. She usually stays in a chair for most of the day and then transfers to a bed at night. 5/19; patient presents for 1 week follow-up. Son was present and helped provide the history. She has been taking Augmentin without any issues. They have been applying Santyl daily to the wound. No complaints today. 6/2; patient presents for 1 week follow-up. Son is present and helps provide the history. She has been using Dakin's wet-to-dry. She states she  feels well and denies any signs of infection. 6/9; patient presents for 1 week follow-up. Son is present and helps provide the history. He continues to do Dakin's wet-to-dry. Overall patient feels well and denies signs of infection. She is using her airflow mattress for about 12 hours a day. 6/23; patient presents for 2-week follow-up. Son is present. He has been using Dakin's wet-to-dry and has noticed improvement in the wound. There are no issues or complaints today. She denies signs of infection. 7/8; patient presents for 2-week follow-up. Son is present. She has been using silver alginate every other day to the wound bed. Unfortunately she has had some excoriation to the surrounding skin. She also developed 2 skin tears to her left leg and is not quite sure how this happened. She denies signs of infection. 7/21; patient presents for 2-week follow-up. Son is present. She has been using Dakin wet-to-dry dressings daily. There has been improvement in the odor and drainage. No obvious signs of infection. 7/28; patient presents for 1 week follow-up. There has been a significant decrease in odor and drainage since last week with the continued use of Dakin's wet- to-dry dressings. Patient and son would like to try a new dressing to see if this will help stimulate healing. 8/25; patient presents for follow-up. There are no issues or complaints today. Patient denies signs of  infection. 9/19; patient presents for follow-up. Son reports improvement in wound healing to the left trochanter wound with collagen. Patient's been using Vaseline to the left foot wound and Santyl to the right lateral ankle wound. There are no issues or complaints today. Patient History Unable to Obtain Patient History due to Dementia. Information obtained from Patient. Family History Unknown History. Social History Never smoker, Marital Status - Married, Alcohol Use - Never, Drug Use - No History, Caffeine Use - Never. Medical History Eyes Denies history of Cataracts, Glaucoma, Optic Neuritis Ear/Nose/Mouth/Throat Denies history of Chronic sinus problems/congestion, Middle ear problems Hematologic/Lymphatic Denies history of Anemia, Hemophilia, Human Immunodeficiency Virus, Lymphedema, Sickle Cell Disease Respiratory Denies history of Aspiration, Asthma, Chronic Obstructive Pulmonary Disease (COPD), Pneumothorax, Sleep Apnea, Tuberculosis Cardiovascular Patient has history of Hypertension, Myocardial Infarction - 2019 Denies history of Angina, Arrhythmia, Congestive Heart Failure, Coronary Artery Disease, Deep Vein Thrombosis, Hypotension, Peripheral Arterial Disease, Peripheral Venous Disease, Phlebitis, Vasculitis Gastrointestinal Patient has history of Crohnoos Denies history of Cirrhosis , Colitis, Hepatitis A, Hepatitis B, Hepatitis C Endocrine Denies history of Type I Diabetes, Type II Diabetes Genitourinary Denies history of End Stage Renal Disease Immunological Denies history of Lupus Erythematosus, Raynaudoos, Scleroderma Integumentary (Skin) Denies history of History of Burn Musculoskeletal Denies history of Gout, Rheumatoid Arthritis, Osteoarthritis, Osteomyelitis Neurologic Patient has history of Dementia Denies history of Neuropathy, Quadriplegia, Paraplegia, Seizure Disorder Oncologic Denies history of Received Chemotherapy, Received  Radiation Psychiatric Denies history of Anorexia/bulimia, Confinement Anxiety Hospitalization/Surgery History - left heart cath and coronary angiography. - cardiac cath. - EGD. - colectomy. - dilation and curettage of uterus. - lap right hemicolectomy. Medical A Surgical History Notes nd Gastrointestinal incontienent Genitourinary incontinent; CKD stage 4 Neurologic hx TIA; alzheimer's Objective Constitutional respirations regular, non-labored and within target range for patient.. Vitals Time Taken: 3:39 PM, Height: 63 in, Weight: 80 lbs, BMI: 14.2, Temperature: 98.1 F, Pulse: 73 bpm, Respiratory Rate: 16 breaths/min, Blood Pressure: 119/74 mmHg. Psychiatric pleasant and cooperative. General Notes: Left trochanter: Open wound with granulation tissue at the opening. Marc Morgans of undermining throughout. No obvious signs of acute infection. Minimal serosanguinous drainage on exam.  Left foot: T the plantar aspect scab noted. No signs of infection. Right ankle: T the lateral aspect there is a small o o open wound with granulation tissue. No signs of infection. Integumentary (Hair, Skin) Wound #1 status is Open. Original cause of wound was Gradually Appeared. The date acquired was: 02/05/2021. The wound has been in treatment 19 weeks. The wound is located on the Left Trochanter. The wound measures 1.2cm length x 0.4cm width x 1.3cm depth; 0.377cm^2 area and 0.49cm^3 volume. There is Fat Layer (Subcutaneous Tissue) exposed. There is no undermining noted, however, there is tunneling at 9:00 with a maximum distance of 4cm. There is a medium amount of purulent drainage noted. The wound margin is well defined and not attached to the wound base. There is large (67-100%) red, pink granulation within the wound bed. There is no necrotic tissue within the wound bed. Wound #4 status is Open. Original cause of wound was Pressure Injury. The date acquired was: 05/01/2021. The wound has been in treatment 8  weeks. The wound is located on the [place]. The wound measures 1cm length x 0.7cm width x 0.1cm depth; 0.55cm^2 area and 0.055cm^3 volume. There is Fat Layer (Subcutaneous Tissue) exposed. There is no tunneling or undermining noted. There is a medium amount of serosanguineous drainage noted. The wound margin is distinct with the outline attached to the wound base. There is large (67-100%) pink granulation within the wound bed. There is a small (1-33%) amount of necrotic tissue within the wound bed including Eschar. Wound #6 status is Open. Original cause of wound was Pressure Injury. The date acquired was: 05/01/2021. The wound has been in treatment 8 weeks. The wound is located on the Right,Lateral Ankle. The wound measures 0.5cm length x 0.5cm width x 0.1cm depth; 0.196cm^2 area and 0.02cm^3 volume. There is Fat Layer (Subcutaneous Tissue) exposed. There is no tunneling or undermining noted. There is a medium amount of serosanguineous drainage noted. The wound margin is distinct with the outline attached to the wound base. There is medium (34-66%) pink granulation within the wound bed. There is a medium (34- 66%) amount of necrotic tissue within the wound bed including Adherent Slough. Assessment Active Problems ICD-10 Unspecified open wound, left lower leg, subsequent encounter Unspecified open wound, right ankle, subsequent encounter Unspecified open wound, left foot, subsequent encounter Pressure ulcer of left hip, stage 3 Alzheimer's disease, unspecified Essential (primary) hypertension Chronic kidney disease, unspecified Patient's wounds continue to show improvement in size and appearance since last clinic visit. I recommended continuing current treatment. No signs of infection on exam. Follow-up in 2 weeks Plan Follow-up Appointments: Return Appointment in 2 weeks. - with Dr. Heber [place] Bathing/ Shower/ Hygiene: May shower with protection but do not get wound dressing(s)  wet. Off-Loading: Gel mattress overlay (Group 1) Roho cushion for wheelchair Turn and reposition every 2 hours Home Health: No change in wound care orders this week; continue Home Health for wound care. May utilize formulary equivalent dressing for wound treatment orders unless otherwise specified. Other Home Health Orders/Instructions: Alvis [facility]ings HH WOUND #1: - Trochanter Wound Laterality: Left Cleanser: Soap and Water Every Other Day/30 Days Discharge Instructions: May shower and wash wound with dial antibacterial soap and water prior to dressing change. Cleanser: Wound Cleanser (Home Health) (Generic) Every Other Day/30 Days Discharge Instructions: Cleanse the wound with wound cleanser or normal saline prior to applying a clean dressing using gauze sponges, not tissue or cotton balls. Peri-Wound Care: Zinc Oxide Ointment 30g tube [facility])  Every Other Day/30 Days Discharge Instructions: Apply Zinc Oxide to excoriated areas Prim Dressing: Promogran Prisma Matrix, 4.34 (sq in) (silver collagen) (Home Health) Every Other Day/30 Days ary Discharge Instructions: Moisten collagen with saline or hydrogel/[place] Jelly lightly pack into wound bed. Prim Dressing: wet to dry gauze [facility]) Every Other Day/30 Days ary Discharge Instructions: saline or [place] jelly moisten gauze backing the prisma. Secondary Dressing: Bordered Gauze, 4x4 in [facility]) Every Other Day/30 Days Discharge Instructions: Apply over primary dressing as directed. WOUND #4: - Foot Wound Laterality: Plantar, Left Cleanser: Soap and Water 1 x Per Day/15 Days Discharge Instructions: May shower and wash wound with dial antibacterial soap and water prior to dressing change. Prim Dressing: Vaseline 1 x Per Day/15 Days ary Discharge Instructions: apply thin layer of Vaseline to area Secondary Dressing: Bordered Gauze, 2x2 in 1 x Per Day/15 Days Discharge Instructions: Apply over primary dressing as directed. WOUND #6: -  Ankle Wound Laterality: Right, Lateral Cleanser: Soap and Water 1 x Per TXH/74 Days Discharge Instructions: May shower and wash wound with dial antibacterial soap and water prior to dressing change. Prim Dressing: Santyl Ointment (Home Health) 1 x Per Day/15 Days ary Discharge Instructions: Apply nickel thick amount to wound bed as instructed Secondary Dressing: Bordered Gauze, 2x2 in ([place]) 1 x Per Day/15 Days Discharge Instructions: Apply over primary dressing as directed. 1. Continue collagen to the left trochanter wound, Santyl to the right lateral ankle wound and Vaseline to the left foot wound 2. Follow-up in 2 weeks Electronic Signature(s) Signed: 07/03/2021 4:36:47 PM By: Kalman Shan DO Entered By: Kalman Shan on 07/03/2021 16:35:37 -------------------------------------------------------------------------------- HxROS Details Patient Name: Date of Service: [name], Rhonda [name] 07/03/2021 3:00 PM Medical Record Number: [id number] Patient Account Number: [id number] Date of Birth/Sex: Treating RN: 1935-03-27 (86 y.o. [name] Primary Care Provider: Clent Demark Other Clinician: Referring Provider: Treating Provider/Extender: Dellie Burns in Treatment: 2 Unable to Obtain Patient History due to Dementia Information Obtained From Patient Eyes Medical History: Negative for: Cataracts; Glaucoma; Optic Neuritis Ear/Nose/Mouth/Throat Medical History: Negative for: Chronic sinus problems/congestion; Middle ear problems Hematologic/Lymphatic Medical History: Negative for: Anemia; Hemophilia; Human Immunodeficiency Virus; Lymphedema; Sickle Cell Disease Respiratory Medical History: Negative for: Aspiration; Asthma; Chronic Obstructive Pulmonary Disease (COPD); Pneumothorax; Sleep Apnea; Tuberculosis Cardiovascular Medical History: Positive for: Hypertension; Myocardial Infarction - 2019 Negative for: Angina; Arrhythmia;  Congestive Heart Failure; Coronary Artery Disease; Deep Vein Thrombosis; Hypotension; Peripheral Arterial Disease; Peripheral Venous Disease; Phlebitis; Vasculitis Gastrointestinal Medical History: Positive for: Crohns Negative for: Cirrhosis ; Colitis; Hepatitis A; Hepatitis B; Hepatitis C Past Medical History Notes: incontienent Endocrine Medical History: Negative for: Type I Diabetes; Type II Diabetes Genitourinary Medical History: Negative for: End Stage Renal Disease Past Medical History Notes: incontinent; CKD stage 4 Immunological Medical History: Negative for: Lupus Erythematosus; Raynauds; Scleroderma Integumentary (Skin) Medical History: Negative for: History of Burn Musculoskeletal Medical History: Negative for: Gout; Rheumatoid Arthritis; Osteoarthritis; Osteomyelitis Neurologic Medical History: Positive for: Dementia Negative for: Neuropathy; Quadriplegia; Paraplegia; Seizure Disorder Past Medical History Notes: hx TIA; alzheimer's Oncologic Medical History: Negative for: Received Chemotherapy; Received Radiation Psychiatric Medical History: Negative for: Anorexia/bulimia; Confinement Anxiety Immunizations Pneumococcal Vaccine: Received Pneumococcal Vaccination: No Implantable Devices None Hospitalization / Surgery History Type of Hospitalization/Surgery left heart cath and coronary angiography cardiac cath EGD colectomy dilation and curettage of uterus lap right hemicolectomy Family and Social History Unknown History: Yes; Never smoker; Marital Status - Married; Alcohol Use: Never; Drug Use: No  History; Caffeine Use: Never; Financial Concerns: No; Food, Clothing or Shelter Needs: No; Support System Lacking: No; Transportation Concerns: No Electronic Signature(s) Signed: 07/03/2021 4:36:47 PM By: Kalman Shan DO Signed: 07/03/2021 5:01:42 PM By: Lorrin Jackson Entered By: Kalman Shan on 07/03/2021  16:33:00 -------------------------------------------------------------------------------- SuperBill Details Patient Name: Date of Service: [name], Rhonda [name] 07/03/2021 Medical Record Number: [id number] Patient Account Number: [id number] Date of Birth/Sex: Treating RN: 19-Sep-1935 (86 y.o. [name] Primary Care Provider: Clent Demark Other Clinician: Referring Provider: Treating Provider/Extender: Dellie Burns in Treatment: 19 Diagnosis Coding ICD-10 Codes Code Description S81.802D Unspecified open wound, left lower leg, subsequent encounter S91.001D Unspecified open wound, right ankle, subsequent encounter S91.302D Unspecified open wound, left foot, subsequent encounter L89.223 Pressure ulcer of left hip, stage 3 G30.9 Alzheimer's disease, unspecified I10 Essential (primary) hypertension N18.9 Chronic kidney disease, unspecified Facility Procedures CPT4 Code: [id number] Description: 99214 - WOUND CARE VISIT-LEV 4 EST PT Modifier: Quantity: 1 Physician Procedures : CPT4 Code Description Modifier [id number] - WC PHYS LEVEL 3 - EST PT ICD-10 Diagnosis Description L89.223 Pressure ulcer of left hip, stage 3 S91.001D Unspecified open wound, right ankle, subsequent encounter S91.302D Unspecified open wound, left  foot, subsequent encounter Quantity: 1 Electronic Signature(s) Signed: 07/03/2021 4:36:47 PM By: Kalman Shan DO Entered By: Kalman Shan on 07/03/2021 16:36:20

## 2021-07-04 NOTE — Progress Notes (Signed)
LLUVIA, GWYNNE ([id number]) Visit Report for 07/03/2021 Arrival Information Details Patient Name: Date of Service: [name], [place] [name] 07/03/2021 3:00 PM Medical Record Number: [id number] Patient Account Number: [id number] Date of Birth/Sex: Treating RN: 02/27/1935 (86 y.o. [name] Primary Care Anhar Mcdermott: Clent Demark Other Clinician: Referring Elliette Seabolt: Treating Anajah Sterbenz/Extender: Dellie Burns in Treatment: 72 Visit Information History Since Last Visit Added or deleted any medications: No Patient Arrived: Wheel Chair Any new allergies or adverse reactions: No Arrival Time: 15:39 Had a fall or experienced change in No Accompanied By: son activities of daily living that may affect Transfer Assistance: EasyPivot Patient Lift risk of falls: Patient Identification Verified: Yes Signs or symptoms of abuse/neglect since last visito No Secondary Verification Process Completed: Yes Hospitalized since last visit: No Patient Requires Transmission-Based Precautions: No Implantable device outside of the clinic excluding No Patient Has Alerts: No cellular tissue based products placed in the center since last visit: Has Dressing in Place as Prescribed: Yes Pain Present Now: No Electronic Signature(s) Signed: 07/04/2021 4:02:45 PM By: Sandre Kitty Entered By: Sandre Kitty on 07/03/2021 15:39:29 -------------------------------------------------------------------------------- Clinic Level of Care Assessment Details Patient Name: Date of Service: [name], [place] [name] 07/03/2021 3:00 PM Medical Record Number: [id number] Patient Account Number: [id number] Date of Birth/Sex: Treating RN: 25-Feb-1935 (86 y.o. [name] Primary Care Otho Michalik: Clent Demark Other Clinician: Referring Kayani Rapaport: Treating Keven Soucy/Extender: Dellie Burns in Treatment: 19 Clinic Level of Care Assessment Items TOOL 4 Quantity Score X- 1  0 Use when only an EandM is performed on FOLLOW-UP visit ASSESSMENTS - Nursing Assessment / Reassessment X- 1 10 Reassessment of Co-morbidities (includes updates in patient status) X- 1 5 Reassessment of Adherence to Treatment Plan ASSESSMENTS - Wound and Skin A ssessment / Reassessment _0  - 0 Simple Wound Assessment / Reassessment - one wound X- 3 5 Complex Wound Assessment / Reassessment - multiple wounds _1  - 0 Dermatologic / Skin Assessment (not related to wound area) ASSESSMENTS - Focused Assessment _2  - 0 Circumferential Edema Measurements - multi extremities _3  - 0 Nutritional Assessment / Counseling / Intervention _4  - 0 Lower Extremity Assessment (monofilament, tuning fork, pulses) _5  - 0 Peripheral Arterial Disease Assessment (using hand held doppler) ASSESSMENTS - Ostomy and/or Continence Assessment and Care _6  - 0 Incontinence Assessment and Management _7  - 0 Ostomy Care Assessment and Management (repouching, etc.) PROCESS - Coordination of Care _8  - 0 Simple Patient / Family Education for ongoing care X- 1 20 Complex (extensive) Patient / Family Education for ongoing care _9  - 0 Staff obtains Programmer, systems, Records, T Results / Process Orders est X- 1 10 Staff telephones HHA, Nursing Homes / Clarify orders / etc _10  - 0 Routine Transfer to another Facility (non-emergent condition) _11  - 0 Routine Hospital Admission (non-emergent condition) _12  - 0 New Admissions / Biomedical engineer / Ordering NPWT Apligraf, etc. , _13  - 0 Emergency Hospital Admission (emergent condition) _14  - 0 Simple Discharge Coordination _15  - 0 Complex (extensive) Discharge Coordination PROCESS - Special Needs _16  - 0 Pediatric / Minor Patient Management _17  - 0 Isolation Patient Management _18  - 0 Hearing / Language / Visual special needs _19  - 0 Assessment of Community assistance (transportation, D/C planning, etc.) _20  - 0 Additional assistance / Altered mentation _21  -  0 Support Surface(s) Assessment (bed, cushion, seat, etc.) INTERVENTIONS - Wound Cleansing / Measurement _22  - 0 Simple Wound Cleansing - one wound X- 3 5 Complex Wound Cleansing -  multiple wounds X- 1 5 Wound Imaging (photographs - any number of wounds) _0  - 0 Wound Tracing (instead of photographs) _1  - 0 Simple Wound Measurement - one wound X- 3 5 Complex Wound Measurement - multiple wounds INTERVENTIONS - Wound Dressings X - Small Wound Dressing one or multiple wounds 3 10 _2  - 0 Medium Wound Dressing one or multiple wounds _3  - 0 Large Wound Dressing one or multiple wounds <BDZHGDJMEQASTMHD>_6<\/QIWLNLGXQJJHERDE>_0  - 0 Application of Medications - topical <CXKGYJEHUDJSHFWY>_6<\/VZCHYIFOYDXAJOIN>_8  - 0 Application of Medications - injection INTERVENTIONS - Miscellaneous _6  - 0 External ear exam _7  - 0 Specimen Collection (cultures, biopsies, blood, body fluids, etc.) _8  - 0 Specimen(s) / Culture(s) sent or taken to Lab for analysis _9  - 0 Patient Transfer (multiple staff / Civil Service fast streamer / Similar devices) _10  - 0 Simple Staple / Suture removal (25 or less) _11  - 0 Complex Staple / Suture removal (26 or more) _12  - 0 Hypo / Hyperglycemic Management (close monitor of Blood Glucose) _13  - 0 Ankle / Brachial Index (ABI) - do not check if billed separately X- 1 5 Vital Signs Has the patient been seen at the hospital within the last three years: Yes Total Score: 130 Level Of Care: New/Established - Level 4 Electronic Signature(s) Signed: 07/03/2021 5:01:42 PM By: Lorrin Jackson Entered By: Lorrin Jackson on 07/03/2021 16:18:49 -------------------------------------------------------------------------------- Encounter Discharge Information Details Patient Name: Date of Service: [name] 07/03/2021 3:00 PM Medical Record Number: [id number] Patient Account Number: [id number] Date of Birth/Sex: Treating RN: 09/26/1935 (86 y.o. [name] Primary Care Urie Loughner: Clent Demark Other Clinician: Referring Yared Barefoot: Treating  Anthonie Lotito/Extender: Dellie Burns in Treatment: 78 Encounter Discharge Information Items Discharge Condition: Stable Ambulatory Status: Wheelchair Discharge Destination: Home Transportation: Private Auto Accompanied By: son Schedule Follow-up Appointment: Yes Clinical Summary of Care: Provided on 07/03/2021 Form Type Recipient Paper Patient Patient Electronic Signature(s) Signed: 07/03/2021 5:01:42 PM By: Lorrin Jackson Entered By: Lorrin Jackson on 07/03/2021 16:19:46 -------------------------------------------------------------------------------- Lower Extremity Assessment Details Patient Name: Date of Service: [name], [name] 07/03/2021 3:00 PM Medical Record Number: [id number] Patient Account Number: [id number] Date of Birth/Sex: Treating RN: 25-Mar-1935 (86 y.o. [name] Primary Care Tanesia Butner: Clent Demark Other Clinician: Referring Mariely Mahr: Treating Madisson Kulaga/Extender: Dellie Burns in Treatment: 19 Electronic Signature(s) Signed: 07/03/2021 5:01:42 PM By: Lorrin Jackson Entered By: Lorrin Jackson on 07/03/2021 15:58:47 -------------------------------------------------------------------------------- Multi Wound Chart Details Patient Name: Date of Service: [name] 07/03/2021 3:00 PM Medical Record Number: [id number] Patient Account Number: [id number] Date of Birth/Sex: Treating RN: September 21, 1935 (86 y.o. [name] Primary Care Gaylynn Seiple: Clent Demark Other Clinician: Referring Chemere Steffler: Treating Alfa Leibensperger/Extender: Dellie Burns in Treatment: 19 Vital Signs Height(in): 42 Pulse(bpm): 87 Weight(lbs): 39 Blood Pressure(mmHg): 119/74 Body Mass Index(BMI): 14 Temperature(F): 98.1 Respiratory Rate(breaths/min): 16 Photos: Left Trochanter Left, Plantar Foot Right, Lateral Ankle Wound Location: Gradually Appeared Pressure Injury Pressure Injury Wounding  Event: Pressure Ulcer Pressure Ulcer Pressure Ulcer Primary Etiology: Hypertension, Myocardial Infarction, Hypertension, Myocardial Infarction, Hypertension, Myocardial Infarction, Comorbid History: Crohns, Dementia Crohns, Dementia Crohns, Dementia 02/05/2021 05/01/2021 05/01/2021 Date Acquired: _14 Weeks of Treatment: Open Open Open Wound Status: 1.2x0.4x1.3 1x0.7x0.1 0.5x0.5x0.1 Measurements L x W x D (cm) 0.377 0.55 0.196 A (cm) : rea 0.49 0.055 0.02 Volume (cm) : 96.80% 92.20% 37.60% % Reduction in A rea: 97.90% 92.20% 68.30% % Reduction in Volume: 9 Position 1 (o'clock): 4 Maximum Distance 1 (cm): Yes No  No Tunneling: Category/Stage III Category/Stage II Category/Stage II Classification: Medium Medium Medium Exudate A mount: Purulent Serosanguineous Serosanguineous Exudate Type: yellow, brown, green red, brown red, brown Exudate Color: Well defined, not attached Distinct, outline attached Distinct, outline attached Wound Margin: Large (67-100%) Large (67-100%) Medium (34-66%) Granulation A mount: Red, Pink Pink Pink Granulation Quality: None Present (0%) Small (1-33%) Medium (34-66%) Necrotic A mount: N/A Eschar Adherent Slough Necrotic Tissue: Fat Layer (Subcutaneous Tissue): Yes Fat Layer (Subcutaneous Tissue): Yes Fat Layer (Subcutaneous Tissue): Yes Exposed Structures: Fascia: No Fascia: No Fascia: No Tendon: No Tendon: No Tendon: No Muscle: No Muscle: No Muscle: No Joint: No Joint: No Joint: No Bone: No Bone: No Bone: No Small (1-33%) Large (67-100%) Medium (34-66%) Epithelialization: Treatment Notes Wound #1 (Trochanter) Wound Laterality: Left Cleanser Soap and Water Discharge Instruction: May shower and wash wound with dial antibacterial soap and water prior to dressing change. Wound Cleanser Discharge Instruction: Cleanse the wound with wound cleanser or normal saline prior to applying a clean dressing using gauze sponges,  not tissue or cotton balls. Peri-Wound Care Zinc Oxide Ointment 30g tube Discharge Instruction: Apply Zinc Oxide to excoriated areas Topical Primary Dressing Promogran Prisma Matrix, 4.34 (sq in) (silver collagen) Discharge Instruction: Moisten collagen with saline or hydrogel/KY Jelly lightly pack into wound bed. wet to dry gauze Discharge Instruction: saline or KY jelly moisten gauze backing the prisma. Secondary Dressing Bordered Gauze, 4x4 in Discharge Instruction: Apply over primary dressing as directed. Secured With Compression Wrap Compression Stockings Add-Ons Wound #4 (Foot) Wound Laterality: Plantar, Left Cleanser Soap and Water Discharge Instruction: May shower and wash wound with dial antibacterial soap and water prior to dressing change. Peri-Wound Care Topical Primary Dressing Vaseline Discharge Instruction: apply thin layer of Vaseline to area Secondary Dressing Bordered Gauze, 2x2 in Discharge Instruction: Apply over primary dressing as directed. Secured With Compression Wrap Compression Stockings Add-Ons Wound #6 (Ankle) Wound Laterality: Right, Lateral Cleanser Soap and Water Discharge Instruction: May shower and wash wound with dial antibacterial soap and water prior to dressing change. Peri-Wound Care Topical Primary Dressing Santyl Ointment Discharge Instruction: Apply nickel thick amount to wound bed as instructed Secondary Dressing Bordered Gauze, 2x2 in Discharge Instruction: Apply over primary dressing as directed. Secured With Compression Wrap Compression Stockings Environmental education officer) Signed: 07/03/2021 4:36:47 PM By: Kalman Shan DO Signed: 07/03/2021 5:01:42 PM By: Lorrin Jackson Entered By: Kalman Shan on 07/03/2021 16:29:24 -------------------------------------------------------------------------------- Multi-Disciplinary Care Plan Details Patient Name: Date of Service: [name] 07/03/2021 3:00  PM Medical Record Number: [id number] Patient Account Number: [id number] Date of Birth/Sex: Treating RN: 06/04/1935 (86 y.o. [name] Primary Care Liba Hulsey: Clent Demark Other Clinician: Referring Maniah Nading: Treating Brenden Rudman/Extender: Dellie Burns in Treatment: [place] reviewed with physician Active Inactive Pressure Nursing Diagnoses: Knowledge deficit related to causes and risk factors for pressure ulcer development Knowledge deficit related to management of pressures ulcers Potential for impaired tissue integrity related to pressure, friction, moisture, and shear Goals: Patient/caregiver will verbalize risk factors for pressure ulcer development Date Initiated: 02/20/2021 Target Resolution Date: 07/07/2021 Goal Status: Active Patient/caregiver will verbalize understanding of pressure ulcer management Date Initiated: 02/20/2021 Date Inactivated: 03/23/2021 Target Resolution Date: 03/24/2021 Goal Status: Met Interventions: Assess: immobility, friction, shearing, incontinence upon admission and as needed Assess offloading mechanisms upon admission and as needed Assess potential for pressure ulcer upon admission and as needed Provide education on pressure ulcers Notes: Wound/Skin Impairment Nursing Diagnoses: Impaired tissue integrity Knowledge deficit  related to ulceration/compromised skin integrity Goals: Patient/caregiver will verbalize understanding of skin care regimen Date Initiated: 02/20/2021 Target Resolution Date: 07/07/2021 Goal Status: Active Ulcer/skin breakdown will have a volume reduction of 30% by week 4 Date Initiated: 02/20/2021 Date Inactivated: 03/23/2021 Target Resolution Date: 03/24/2021 Unmet Reason: deeper according to Goal Status: Unmet wound care measurements. Interventions: Assess patient/caregiver ability to obtain necessary supplies Assess patient/caregiver ability to perform ulcer/skin care  regimen upon admission and as needed Assess ulceration(s) every visit Provide education on ulcer and skin care Notes: Electronic Signature(s) Signed: 07/03/2021 5:01:42 PM By: Lorrin Jackson Entered By: Lorrin Jackson on 07/03/2021 16:03:10 -------------------------------------------------------------------------------- Pain Assessment Details Patient Name: Date of Service: [name], [name] 07/03/2021 3:00 PM Medical Record Number: [id number] Patient Account Number: [id number] Date of Birth/Sex: Treating RN: 1935-09-30 (86 y.o. [name] Primary Care Katielynn Horan: Clent Demark Other Clinician: Referring Palmina Clodfelter: Treating Avner Stroder/Extender: Dellie Burns in Treatment: 19 Active Problems Location of Pain Severity and Description of Pain Patient Has Paino No Site Locations Pain Management and Medication Current Pain Management: Electronic Signature(s) Signed: 07/03/2021 5:01:42 PM By: Lorrin Jackson Signed: 07/04/2021 4:02:45 PM By: Sandre Kitty Entered By: Sandre Kitty on 07/03/2021 15:39:59 -------------------------------------------------------------------------------- Patient/Caregiver Education Details Patient Name: Date of Service: [place] Shelbie [place] [name] 9/19/2022andnbsp3:00 PM Medical Record Number: [id number] Patient Account Number: [id number] Date of Birth/Gender: Treating RN: Oct 26, 1934 (86 y.o. [name] Primary Care Physician: Clent Demark Other Clinician: Referring Physician: Treating Physician/Extender: Dellie Burns in Treatment: 18 Education Assessment Education Provided To: Caregiver Education Topics Provided Pressure: Methods: Explain/Verbal, Printed Responses: State content correctly Wound/Skin Impairment: Methods: Demonstration, Explain/Verbal, Printed Responses: State content correctly Electronic Signature(s) Signed: 07/03/2021 5:01:42 PM By: Lorrin Jackson Entered By:  Lorrin Jackson on 07/03/2021 16:03:30 -------------------------------------------------------------------------------- Wound Assessment Details Patient Name: Date of Service: [name], [name] 07/03/2021 3:00 PM Medical Record Number: [id number] Patient Account Number: [id number] Date of Birth/Sex: Treating RN: 23-Oct-1934 (86 y.o. [name] Primary Care Trilby Way: Clent Demark Other Clinician: Referring Zoiey Christy: Treating Crystalee Ventress/Extender: Dellie Burns in Treatment: 19 Wound Status Wound Number: 1 Primary Etiology: Pressure Ulcer Wound Location: Left Trochanter Wound Status: Open Wounding Event: Gradually Appeared Comorbid History: Hypertension, Myocardial Infarction, Crohns, Dementia Date Acquired: 02/05/2021 Weeks Of Treatment: 19 Clustered Wound: No Photos Wound Measurements Length: (cm) 1.2 Width: (cm) 0.4 Depth: (cm) 1.3 Area: (cm) 0.377 Volume: (cm) 0.49 % Reduction in Area: 96.8% % Reduction in Volume: 97.9% Epithelialization: Small (1-33%) Tunneling: Yes Position (o'clock): 9 Maximum Distance: (cm) 4 Undermining: No Wound Description Classification: Category/Stage III Wound Margin: Well defined, not attached Exudate Amount: Medium Exudate Type: Purulent Exudate Color: yellow, brown, green Foul Odor After Cleansing: No Slough/Fibrino No Wound Bed Granulation Amount: Large (67-100%) Exposed Structure Granulation Quality: Red, Pink Fascia Exposed: No Necrotic Amount: None Present (0%) Fat Layer (Subcutaneous Tissue) Exposed: Yes Tendon Exposed: No Muscle Exposed: No Joint Exposed: No Bone Exposed: No Treatment Notes Wound #1 (Trochanter) Wound Laterality: Left Cleanser Soap and Water Discharge Instruction: May shower and wash wound with dial antibacterial soap and water prior to dressing change. Wound Cleanser Discharge Instruction: Cleanse the wound with wound cleanser or normal saline prior to applying a  clean dressing using gauze sponges, not tissue or cotton balls. Peri-Wound Care Zinc Oxide Ointment 30g tube Discharge Instruction: Apply Zinc Oxide to excoriated areas Topical Primary Dressing Promogran Prisma Matrix, 4.34 (sq in) (silver collagen) Discharge Instruction: Moisten collagen with saline or  hydrogel/KY Jelly lightly pack into wound bed. wet to dry gauze Discharge Instruction: saline or KY jelly moisten gauze backing the prisma. Secondary Dressing Bordered Gauze, 4x4 in Discharge Instruction: Apply over primary dressing as directed. Secured With Compression Wrap Compression Stockings Environmental education officer) Signed: 07/03/2021 5:01:42 PM By: Lorrin Jackson Entered By: Lorrin Jackson on 07/03/2021 16:00:17 -------------------------------------------------------------------------------- Wound Assessment Details Patient Name: Date of Service: [name], [name] 07/03/2021 3:00 PM Medical Record Number: [id number] Patient Account Number: [id number] Date of Birth/Sex: Treating RN: [birth year] (86 y.o. [name] Primary Care Rozanne Heumann: Clent Demark Other Clinician: Referring Ellery Tash: Treating Jericho Alcorn/Extender: Dellie Burns in Treatment: 19 Wound Status Wound Number: 4 Primary Etiology: Pressure Ulcer Wound Location: Left, Plantar Foot Wound Status: Open Wounding Event: Pressure Injury Comorbid History: Hypertension, Myocardial Infarction, Crohns, Dementia Date Acquired: 05/01/2021 Weeks Of Treatment: 8 Clustered Wound: No Photos Wound Measurements Length: (cm) 1 Width: (cm) 0.7 Depth: (cm) 0.1 Area: (cm) 0.55 Volume: (cm) 0.055 % Reduction in Area: 92.2% % Reduction in Volume: 92.2% Epithelialization: Large (67-100%) Tunneling: No Undermining: No Wound Description Classification: Category/Stage II Wound Margin: Distinct, outline attached Exudate Amount: Medium Exudate Type: Serosanguineous Exudate Color:  red, brown Foul Odor After Cleansing: No Slough/Fibrino No Wound Bed Granulation Amount: Large (67-100%) Exposed Structure Granulation Quality: Pink Fascia Exposed: No Necrotic Amount: Small (1-33%) Fat Layer (Subcutaneous Tissue) Exposed: Yes Necrotic Quality: Eschar Tendon Exposed: No Muscle Exposed: No Joint Exposed: No Bone Exposed: No Treatment Notes Wound #4 (Foot) Wound Laterality: Plantar, Left Cleanser Soap and Water Discharge Instruction: May shower and wash wound with dial antibacterial soap and water prior to dressing change. Peri-Wound Care Topical Primary Dressing Vaseline Discharge Instruction: apply thin layer of Vaseline to area Secondary Dressing Bordered Gauze, 2x2 in Discharge Instruction: Apply over primary dressing as directed. Secured With Compression Wrap Compression Stockings Environmental education officer) Signed: 07/03/2021 5:01:42 PM By: Lorrin Jackson Entered By: Lorrin Jackson on 07/03/2021 16:02:20 -------------------------------------------------------------------------------- Wound Assessment Details Patient Name: Date of Service: [name], [name] 07/03/2021 3:00 PM Medical Record Number: [id number] Patient Account Number: [id number] Date of Birth/Sex: Treating RN: 01-Feb-1935 (86 y.o. [name] Primary Care Keyana Guevara: Clent Demark Other Clinician: Referring Crystle Carelli: Treating Ketrina Boateng/Extender: Dellie Burns in Treatment: 19 Wound Status Wound Number: 6 Primary Etiology: Pressure Ulcer Wound Location: Right, Lateral Ankle Wound Status: Open Wounding Event: Pressure Injury Comorbid History: Hypertension, Myocardial Infarction, Crohns, Dementia Date Acquired: 05/01/2021 Weeks Of Treatment: 8 Clustered Wound: No Photos Wound Measurements Length: (cm) 0.5 Width: (cm) 0.5 Depth: (cm) 0.1 Area: (cm) 0.196 Volume: (cm) 0.02 % Reduction in Area: 37.6% % Reduction in Volume:  68.3% Epithelialization: Medium (34-66%) Tunneling: No Undermining: No Wound Description Classification: Category/Stage II Wound Margin: Distinct, outline attached Exudate Amount: Medium Exudate Type: Serosanguineous Exudate Color: red, brown Foul Odor After Cleansing: No Slough/Fibrino Yes Wound Bed Granulation Amount: Medium (34-66%) Exposed Structure Granulation Quality: Pink Fascia Exposed: No Necrotic Amount: Medium (34-66%) Fat Layer (Subcutaneous Tissue) Exposed: Yes Necrotic Quality: Adherent Slough Tendon Exposed: No Muscle Exposed: No Joint Exposed: No Bone Exposed: No Treatment Notes Wound #6 (Ankle) Wound Laterality: Right, Lateral Cleanser Soap and Water Discharge Instruction: May shower and wash wound with dial antibacterial soap and water prior to dressing change. Peri-Wound Care Topical Primary Dressing Santyl Ointment Discharge Instruction: Apply nickel thick amount to wound bed as instructed Secondary Dressing Bordered Gauze, 2x2 in Discharge Instruction: Apply over primary dressing as directed. Secured With Compression Wrap  Compression Stockings Add-Ons Electronic Signature(s) Signed: 07/03/2021 5:01:42 PM By: Lorrin Jackson Entered By: Lorrin Jackson on 07/03/2021 16:02:47 -------------------------------------------------------------------------------- Vitals Details Patient Name: Date of Service: [name], [name] 07/03/2021 3:00 PM Medical Record Number: [id number] Patient Account Number: [id number] Date of Birth/Sex: Treating RN: [birth year] (86 y.o. [name] Primary Care Chanti Golubski: Clent Demark Other Clinician: Referring Krysten Veronica: Treating Avyay Coger/Extender: Dellie Burns in Treatment: 19 Vital Signs Time Taken: 15:39 Temperature (F): 98.1 Height (in): 63 Pulse (bpm): 73 Weight (lbs): 80 Respiratory Rate (breaths/min): 16 Body Mass Index (BMI): 14.2 Blood Pressure (mmHg): 119/74 Reference  Range: 80 - 120 mg / dl Electronic Signature(s) Signed: 07/04/2021 4:02:45 PM By: Sandre Kitty Entered By: Sandre Kitty on 07/03/2021 15:39:46

## 2021-07-18 ENCOUNTER — Encounter (HOSPITAL_BASED_OUTPATIENT_CLINIC_OR_DEPARTMENT_OTHER): Payer: Medicare Other | Admitting: Internal Medicine

## 2021-07-24 ENCOUNTER — Encounter (HOSPITAL_BASED_OUTPATIENT_CLINIC_OR_DEPARTMENT_OTHER): Payer: Medicare Other | Admitting: Internal Medicine

## 2021-07-25 ENCOUNTER — Encounter (HOSPITAL_BASED_OUTPATIENT_CLINIC_OR_DEPARTMENT_OTHER): Payer: Medicare Other | Admitting: Internal Medicine

## 2021-08-21 ENCOUNTER — Encounter (HOSPITAL_BASED_OUTPATIENT_CLINIC_OR_DEPARTMENT_OTHER): Payer: Medicare Other | Admitting: Internal Medicine

## 2021-08-25 ENCOUNTER — Other Ambulatory Visit: Payer: Self-pay

## 2021-08-25 MED ORDER — ARIPIPRAZOLE 5 MG PO TABS: 5.0000 mg | ORAL_TABLET | Freq: Every day | ORAL | 0 refills | Status: DC

## 2021-08-28 ENCOUNTER — Encounter (HOSPITAL_BASED_OUTPATIENT_CLINIC_OR_DEPARTMENT_OTHER): Payer: Medicare Other | Attending: Internal Medicine | Admitting: Internal Medicine

## 2021-08-28 ENCOUNTER — Other Ambulatory Visit: Payer: Self-pay

## 2021-08-28 DIAGNOSIS — X58XXXD Exposure to other specified factors, subsequent encounter: Secondary | ICD-10-CM | POA: Diagnosis not present

## 2021-08-28 DIAGNOSIS — N184 Chronic kidney disease, stage 4 (severe): Secondary | ICD-10-CM | POA: Insufficient documentation

## 2021-08-28 DIAGNOSIS — S91302D Unspecified open wound, left foot, subsequent encounter: Secondary | ICD-10-CM | POA: Diagnosis not present

## 2021-08-28 DIAGNOSIS — F02C0 Dementia in other diseases classified elsewhere, severe, without behavioral disturbance, psychotic disturbance, mood disturbance, and anxiety: Secondary | ICD-10-CM | POA: Insufficient documentation

## 2021-08-28 DIAGNOSIS — G309 Alzheimer's disease, unspecified: Secondary | ICD-10-CM | POA: Diagnosis not present

## 2021-08-28 DIAGNOSIS — S81802D Unspecified open wound, left lower leg, subsequent encounter: Secondary | ICD-10-CM | POA: Insufficient documentation

## 2021-08-28 DIAGNOSIS — S91001D Unspecified open wound, right ankle, subsequent encounter: Secondary | ICD-10-CM | POA: Insufficient documentation

## 2021-08-28 DIAGNOSIS — I252 Old myocardial infarction: Secondary | ICD-10-CM | POA: Diagnosis not present

## 2021-08-28 DIAGNOSIS — L89223 Pressure ulcer of left hip, stage 3: Secondary | ICD-10-CM | POA: Diagnosis not present

## 2021-08-28 DIAGNOSIS — I129 Hypertensive chronic kidney disease with stage 1 through stage 4 chronic kidney disease, or unspecified chronic kidney disease: Secondary | ICD-10-CM | POA: Insufficient documentation

## 2021-08-29 NOTE — Progress Notes (Signed)
ISSABELLE, MCRANEY ([id number]) Visit Report for 08/28/2021 Chief Complaint Document Details Patient Name: Date of Service: [name], [place] [name] 08/28/2021 2:45 PM Medical Record Number: [id number] Patient Account Number: [id number] Date of Birth/Sex: Treating RN: July 03, 1935 (86 y.o. [name] Primary Care Provider: Clent Demark Other Clinician: Referring Provider: Treating Provider/Extender: Edrick Kins Weeks in Treatment: 49 Information Obtained from: Patient Chief Complaint Left hip wound and Right lateral ankle wound Electronic Signature(s) Signed: 08/28/2021 4:56:42 PM By: Kalman Shan DO Entered By: Kalman Shan on 08/28/2021 16:50:50 -------------------------------------------------------------------------------- HPI Details Patient Name: Date of Service: [name], [name] 08/28/2021 2:45 PM Medical Record Number: [id number] Patient Account Number: [id number] Date of Birth/Sex: Treating RN: 09-11-1935 (86 y.o. [name] Primary Care Provider: Clent Demark Other Clinician: Referring Provider: Treating Provider/Extender: Edrick Kins Weeks in Treatment: 60 History of Present Illness HPI Description: Admission 5/9 Ms. Seminara is an 86 year old female with a past medical history of hypertension, hyperlipidemia, CAD s/p MI, and Alzheimer's Disease that presents to the clinic today for A left hip wound. The son is present and helps provide the history. This was noticed about 2 weeks ago and there was a scab to the area. When this was removed there was depth to the wound. It has gotten slightly larger over the past 3 days. Patient denies any fever/chills, nausea/vomiting and states she has been her usual state of health. Per son she has ample at home care . She has an aide that comes in 5 days a week. She is able to walk with support for short distances. This is limited due to her advanced dementia. She usually  stays in a chair for most of the day and then transfers to a bed at night. 5/19; patient presents for 1 week follow-up. Son was present and helped provide the history. She has been taking Augmentin without any issues. They have been applying Santyl daily to the wound. No complaints today. 6/2; patient presents for 1 week follow-up. Son is present and helps provide the history. She has been using Dakin's wet-to-dry. She states she feels well and denies any signs of infection. 6/9; patient presents for 1 week follow-up. Son is present and helps provide the history. He continues to do Dakin's wet-to-dry. Overall patient feels well and denies signs of infection. She is using her airflow mattress for about 12 hours a day. 6/23; patient presents for 2-week follow-up. Son is present. He has been using Dakin's wet-to-dry and has noticed improvement in the wound. There are no issues or complaints today. She denies signs of infection. 7/8; patient presents for 2-week follow-up. Son is present. She has been using silver alginate every other day to the wound bed. Unfortunately she has had some excoriation to the surrounding skin. She also developed 2 skin tears to her left leg and is not quite sure how this happened. She denies signs of infection. 7/21; patient presents for 2-week follow-up. Son is present. She has been using Dakin wet-to-dry dressings daily. There has been improvement in the odor and drainage. No obvious signs of infection. 7/28; patient presents for 1 week follow-up. There has been a significant decrease in odor and drainage since last week with the continued use of Dakin's wet- to-dry dressings. Patient and son would like to try a new dressing to see if this will help stimulate healing. 8/25; patient presents for follow-up. There are no issues or complaints today. Patient denies  signs of infection. 9/19; patient presents for follow-up. Son reports improvement in wound healing to the left  trochanter wound with collagen. Patient's been using Vaseline to the left foot wound and Santyl to the right lateral ankle wound. There are no issues or complaints today. 11/14; patient presents for follow-up. Patient has had to reschedule her last 3 wound care appointments. She has been using Santyl to the right lateral foot wound. There is a scab t to this area removed by the intake nurse. Son is present and helps provide the history. He reports that the left foot wound has healed. He continues to use collagen to the left trochanter wound. He denies any signs of infection or odor to the wounds. Electronic Signature(s) Signed: 08/28/2021 4:56:42 PM By: Kalman Shan DO Entered By: Kalman Shan on 08/28/2021 16:53:21 -------------------------------------------------------------------------------- Physical Exam Details Patient Name: Date of Service: [name], [name] 08/28/2021 2:45 PM Medical Record Number: [id number] Patient Account Number: [id number] Date of Birth/Sex: Treating RN: 07-04-1935 (86 y.o. [name] Primary Care Provider: Clent Demark Other Clinician: Referring Provider: Treating Provider/Extender: Edrick Kins Weeks in Treatment: 6 Constitutional respirations regular, non-labored and within target range for patient.. Cardiovascular 2+ dorsalis pedis/posterior tibialis pulses. Psychiatric pleasant and cooperative. Notes Left trochanter: Open wound with granulation at the opening. Undermining noted although does not fanlike before. No obvious signs of infection. Left foot: No open wounds. Right ankle: T the lateral aspect there is epithelialization to previous wound site. No signs of infection. o Electronic Signature(s) Signed: 08/28/2021 4:56:42 PM By: Kalman Shan DO Entered By: Kalman Shan on 08/28/2021 16:54:23 -------------------------------------------------------------------------------- Physician Orders  Details Patient Name: Date of Service: [name], [name] 08/28/2021 2:45 PM Medical Record Number: [id number] Patient Account Number: [id number] Date of Birth/Sex: Treating RN: [birth year] (86 y.o. [name], Meta.Reding Primary Care Provider: Clent Demark Other Clinician: Referring Provider: Treating Provider/Extender: Collene Schlichter in Treatment: 61 Verbal / Phone Orders: No Diagnosis Coding ICD-10 Coding Code Description S81.802D Unspecified open wound, left lower leg, subsequent encounter S91.001D Unspecified open wound, right ankle, subsequent encounter S91.302D Unspecified open wound, left foot, subsequent encounter L89.223 Pressure ulcer of left hip, stage 3 G30.9 Alzheimer's disease, unspecified I10 Essential (primary) hypertension N18.9 Chronic kidney disease, unspecified Follow-up Appointments Return appointment in 1 month. - with Dr. Heber Vivian Bathing/ Shower/ Hygiene May shower with protection but do not get wound dressing(s) wet. Off-Loading Gel mattress overlay (Group 1) Roho cushion for wheelchair Turn and reposition every 2 hours [place] wound care orders this week; continue Home Health for wound care. May utilize formulary equivalent dressing for wound treatment orders unless otherwise specified. - Pad with foam border or gauze to the left medial foot and right ankle for protection. Other Home Health Orders/Instructions: [facility] Wound Treatment Wound #1 - Trochanter Wound Laterality: Left Cleanser: Soap and Water Every Other Day/30 Days Discharge Instructions: May shower and wash wound with dial antibacterial soap and water prior to dressing change. Cleanser: Wound Cleanser (Home Health) (Generic) Every Other Day/30 Days Discharge Instructions: Cleanse the wound with wound cleanser or normal saline prior to applying a clean dressing using gauze sponges, not tissue or cotton balls. Peri-Wound Care: Zinc Oxide Ointment 30g tube  [facility]) Every Other Day/30 Days Discharge Instructions: Apply Zinc Oxide to excoriated areas Prim Dressing: Promogran Prisma Matrix, 4.34 (sq in) (silver collagen) (Home Health) Every Other Day/30 Days ary Discharge Instructions: Moisten collagen with saline  or hydrogel/KY Jelly lightly pack into wound bed. Prim Dressing: wet to dry gauze [facility]) Every Other Day/30 Days ary Discharge Instructions: saline or KY jelly moisten gauze backing the prisma. Secondary Dressing: Bordered Gauze, 4x4 in [facility]) Every Other Day/30 Days Discharge Instructions: Apply over primary dressing as directed. Electronic Signature(s) Signed: 08/28/2021 4:56:42 PM By: Kalman Shan DO Previous Signature: 08/28/2021 4:37:29 PM Version By: Deon Pilling RN, BSN Entered By: Kalman Shan on 08/28/2021 16:54:39 -------------------------------------------------------------------------------- Problem List Details Patient Name: Date of Service: [name], [name] 08/28/2021 2:45 PM Medical Record Number: [id number] Patient Account Number: [id number] Date of Birth/Sex: Treating RN: 04/29/1935 (86 y.o. [name], Meta.Reding Primary Care Provider: Clent Demark Other Clinician: Referring Provider: Treating Provider/Extender: Collene Schlichter in Treatment: 47 Active Problems ICD-10 Encounter Code Description Active Date MDM Diagnosis S81.802D Unspecified open wound, left lower leg, subsequent encounter 05/11/2021 No Yes S91.001D Unspecified open wound, right ankle, subsequent encounter 05/11/2021 No Yes S91.302D Unspecified open wound, left foot, subsequent encounter 06/08/2021 No Yes L89.223 Pressure ulcer of left hip, stage 3 02/20/2021 No Yes G30.9 Alzheimer's disease, unspecified 02/20/2021 No Yes I10 Essential (primary) hypertension 02/20/2021 No Yes N18.9 Chronic kidney disease, unspecified 02/20/2021 No Yes Inactive Problems ICD-10 Code Description Active Date Inactive  Date S81.802A Unspecified open wound, left lower leg, initial encounter 05/04/2021 05/08/2021 S91.001A Unspecified open wound, right ankle, initial encounter 05/04/2021 05/08/2021 Resolved Problems Electronic Signature(s) Signed: 08/28/2021 4:56:42 PM By: Kalman Shan DO Previous Signature: 08/28/2021 4:37:29 PM Version By: Deon Pilling RN, BSN Entered By: Kalman Shan on 08/28/2021 16:49:51 -------------------------------------------------------------------------------- Progress Note Details Patient Name: Date of Service: [name], [name] 08/28/2021 2:45 PM Medical Record Number: [id number] Patient Account Number: [id number] Date of Birth/Sex: Treating RN: [birth year] (86 y.o. [name] Primary Care Provider: Clent Demark Other Clinician: Referring Provider: Treating Provider/Extender: Collene Schlichter in Treatment: 41 Subjective Chief Complaint Information obtained from Patient Left hip wound and Right lateral ankle wound History of Present Illness (HPI) Admission 5/9 Ms. Ciampa is an 86 year old female with a past medical history of hypertension, hyperlipidemia, CAD s/p MI, and Alzheimer's Disease that presents to the clinic today for A left hip wound. The son is present and helps provide the history. This was noticed about 2 weeks ago and there was a scab to the area. When this was removed there was depth to the wound. It has gotten slightly larger over the past 3 days. Patient denies any fever/chills, nausea/vomiting and states she has been her usual state of health. Per son she has ample at home care . She has an aide that comes in 5 days a week. She is able to walk with support for short distances. This is limited due to her advanced dementia. She usually stays in a chair for most of the day and then transfers to a bed at night. 5/19; patient presents for 1 week follow-up. Son was present and helped provide the history. She has been  taking Augmentin without any issues. They have been applying Santyl daily to the wound. No complaints today. 6/2; patient presents for 1 week follow-up. Son is present and helps provide the history. She has been using Dakin's wet-to-dry. She states she feels well and denies any signs of infection. 6/9; patient presents for 1 week follow-up. Son is present and helps provide the history. He continues to do Dakin's wet-to-dry. Overall patient feels well and denies signs of  infection. She is using her airflow mattress for about 12 hours a day. 6/23; patient presents for 2-week follow-up. Son is present. He has been using Dakin's wet-to-dry and has noticed improvement in the wound. There are no issues or complaints today. She denies signs of infection. 7/8; patient presents for 2-week follow-up. Son is present. She has been using silver alginate every other day to the wound bed. Unfortunately she has had some excoriation to the surrounding skin. She also developed 2 skin tears to her left leg and is not quite sure how this happened. She denies signs of infection. 7/21; patient presents for 2-week follow-up. Son is present. She has been using Dakin wet-to-dry dressings daily. There has been improvement in the odor and drainage. No obvious signs of infection. 7/28; patient presents for 1 week follow-up. There has been a significant decrease in odor and drainage since last week with the continued use of Dakin's wet- to-dry dressings. Patient and son would like to try a new dressing to see if this will help stimulate healing. 8/25; patient presents for follow-up. There are no issues or complaints today. Patient denies signs of infection. 9/19; patient presents for follow-up. Son reports improvement in wound healing to the left trochanter wound with collagen. Patient's been using Vaseline to the left foot wound and Santyl to the right lateral ankle wound. There are no issues or complaints today. 11/14;  patient presents for follow-up. Patient has had to reschedule her last 3 wound care appointments. She has been using Santyl to the right lateral foot wound. There is a scab t to this area removed by the intake nurse. Son is present and helps provide the history. He reports that the left foot wound has healed. He continues to use collagen to the left trochanter wound. He denies any signs of infection or odor to the wounds. Patient History Unable to Obtain Patient History due to Dementia. Information obtained from Patient. Family History Unknown History. Social History Never smoker, Marital Status - Married, Alcohol Use - Never, Drug Use - No History, Caffeine Use - Never. Medical History Eyes Denies history of Cataracts, Glaucoma, Optic Neuritis Ear/Nose/Mouth/Throat Denies history of Chronic sinus problems/congestion, Middle ear problems Hematologic/Lymphatic Denies history of Anemia, Hemophilia, Human Immunodeficiency Virus, Lymphedema, Sickle Cell Disease Respiratory Denies history of Aspiration, Asthma, Chronic Obstructive Pulmonary Disease (COPD), Pneumothorax, Sleep Apnea, Tuberculosis Cardiovascular Patient has history of Hypertension, Myocardial Infarction - 2019 Denies history of Angina, Arrhythmia, Congestive Heart Failure, Coronary Artery Disease, Deep Vein Thrombosis, Hypotension, Peripheral Arterial Disease, Peripheral Venous Disease, Phlebitis, Vasculitis Gastrointestinal Patient has history of Crohnoos Denies history of Cirrhosis , Colitis, Hepatitis A, Hepatitis B, Hepatitis C Endocrine Denies history of Type I Diabetes, Type II Diabetes Genitourinary Denies history of End Stage Renal Disease Immunological Denies history of Lupus Erythematosus, Raynaudoos, Scleroderma Integumentary (Skin) Denies history of History of Burn Musculoskeletal Denies history of Gout, Rheumatoid Arthritis, Osteoarthritis, Osteomyelitis Neurologic Patient has history of Dementia Denies  history of Neuropathy, Quadriplegia, Paraplegia, Seizure Disorder Oncologic Denies history of Received Chemotherapy, Received Radiation Psychiatric Denies history of Anorexia/bulimia, Confinement Anxiety Hospitalization/Surgery History - left heart cath and coronary angiography. - cardiac cath. - EGD. - colectomy. - dilation and curettage of uterus. - lap right hemicolectomy. Medical A Surgical History Notes nd Gastrointestinal incontienent Genitourinary incontinent; CKD stage 4 Neurologic hx TIA; alzheimer's Objective Constitutional respirations regular, non-labored and within target range for patient.. Vitals Time Taken: 3:10 PM, Height: 63 in, Weight: 80 lbs, BMI: 14.2, Temperature: 97.5 F, Pulse:  75 bpm, Respiratory Rate: 16 breaths/min, Blood Pressure: 118/79 mmHg. Cardiovascular 2+ dorsalis pedis/posterior tibialis pulses. Psychiatric pleasant and cooperative. General Notes: Left trochanter: Open wound with granulation at the opening. Undermining noted although does not fanlike before. No obvious signs of infection. Left foot: No open wounds. Right ankle: T the lateral aspect there is epithelialization to previous wound site. No signs of infection. o Integumentary (Hair, Skin) Wound #1 status is Open. Original cause of wound was Gradually Appeared. The date acquired was: 02/05/2021. The wound has been in treatment 27 weeks. The wound is located on the Left Trochanter. The wound measures 0.7cm length x 0.4cm width x 1cm depth; 0.22cm^2 area and 0.22cm^3 volume. There is Fat Layer (Subcutaneous Tissue) exposed. There is no undermining noted, however, there is tunneling at 9:00 with a maximum distance of 4cm. There is a medium amount of purulent drainage noted. The wound margin is well defined and not attached to the wound base. There is large (67-100%) red, pink granulation within the wound bed. There is no necrotic tissue within the wound bed. Wound #4 status is Healed -  Epithelialized. Original cause of wound was Pressure Injury. The date acquired was: 05/01/2021. The wound has been in treatment 16 weeks. The wound is located on the [place]. The wound measures 0cm length x 0cm width x 0cm depth; 0cm^2 area and 0cm^3 volume. There is Fat Layer (Subcutaneous Tissue) exposed. There is no tunneling or undermining noted. There is a none present amount of drainage noted. The wound margin is distinct with the outline attached to the wound base. There is no granulation within the wound bed. There is no necrotic tissue within the wound bed. General Notes: intact blood blister. Wound #6 status is Healed - Epithelialized. Original cause of wound was Pressure Injury. The date acquired was: 05/01/2021. The wound has been in treatment 16 weeks. The wound is located on the Right,Lateral Ankle. The wound measures 0cm length x 0cm width x 0cm depth; 0cm^2 area and 0cm^3 volume. There is Fat Layer (Subcutaneous Tissue) exposed. There is no tunneling or undermining noted. There is a medium amount of serosanguineous drainage noted. The wound margin is distinct with the outline attached to the wound base. There is no granulation within the wound bed. There is a large (67-100%) amount of necrotic tissue within the wound bed. Assessment Active Problems ICD-10 Unspecified open wound, left lower leg, subsequent encounter Unspecified open wound, right ankle, subsequent encounter Unspecified open wound, left foot, subsequent encounter Pressure ulcer of left hip, stage 3 Alzheimer's disease, unspecified Essential (primary) hypertension Chronic kidney disease, unspecified Patient has 1 remaining wound to her left trochanter. There has been improvement in size since last clinic visit. No signs of infection on exam. I recommended continuing collagen. Patient would like to follow-up monthly. She knows to call with any questions or concerns and can be seen sooner. Plan Follow-up  Appointments: Return appointment in 1 month. - with Dr. Heber [place] Bathing/ Shower/ Hygiene: May shower with protection but do not get wound dressing(s) wet. Off-Loading: Gel mattress overlay (Group 1) Roho cushion for wheelchair Turn and reposition every 2 hours Home Health: New wound care orders this week; continue Home Health for wound care. May utilize formulary equivalent dressing for wound treatment orders unless otherwise specified. - Pad with foam border or gauze to the left medial foot and right ankle for protection. Other Home Health Orders/Instructions: [facility] WOUND #1: - Trochanter Wound Laterality: Left Cleanser: Soap and Water Every Other  Day/30 Days Discharge Instructions: May shower and wash wound with dial antibacterial soap and water prior to dressing change. Cleanser: Wound Cleanser (Home Health) (Generic) Every Other Day/30 Days Discharge Instructions: Cleanse the wound with wound cleanser or normal saline prior to applying a clean dressing using gauze sponges, not tissue or cotton balls. Peri-Wound Care: Zinc Oxide Ointment 30g tube [facility]) Every Other Day/30 Days Discharge Instructions: Apply Zinc Oxide to excoriated areas Prim Dressing: Promogran Prisma Matrix, 4.34 (sq in) (silver collagen) (Home Health) Every Other Day/30 Days ary Discharge Instructions: Moisten collagen with saline or hydrogel/[place] Jelly lightly pack into wound bed. Prim Dressing: wet to dry gauze [facility]) Every Other Day/30 Days ary Discharge Instructions: saline or [place] jelly moisten gauze backing the prisma. Secondary Dressing: Bordered Gauze, 4x4 in [facility]) Every Other Day/30 Days Discharge Instructions: Apply over primary dressing as directed. 1. Collagen 2. Follow-up in 1 month Electronic Signature(s) Signed: 08/28/2021 4:56:42 PM By: Kalman Shan DO Entered By: Kalman Shan on 08/28/2021  16:55:51 -------------------------------------------------------------------------------- HxROS Details Patient Name: Date of Service: [name], [name] 08/28/2021 2:45 PM Medical Record Number: [id number] Patient Account Number: [id number] Date of Birth/Sex: Treating RN: 1935/08/09 (86 y.o. [name] Primary Care Provider: Clent Demark Other Clinician: Referring Provider: Treating Provider/Extender: Edrick Kins Weeks in Treatment: 26 Unable to Obtain Patient History due to Dementia Information Obtained From Patient Eyes Medical History: Negative for: Cataracts; Glaucoma; Optic Neuritis Ear/Nose/Mouth/Throat Medical History: Negative for: Chronic sinus problems/congestion; Middle ear problems Hematologic/Lymphatic Medical History: Negative for: Anemia; Hemophilia; Human Immunodeficiency Virus; Lymphedema; Sickle Cell Disease Respiratory Medical History: Negative for: Aspiration; Asthma; Chronic Obstructive Pulmonary Disease (COPD); Pneumothorax; Sleep Apnea; Tuberculosis Cardiovascular Medical History: Positive for: Hypertension; Myocardial Infarction - 2019 Negative for: Angina; Arrhythmia; Congestive Heart Failure; Coronary Artery Disease; Deep Vein Thrombosis; Hypotension; Peripheral Arterial Disease; Peripheral Venous Disease; Phlebitis; Vasculitis Gastrointestinal Medical History: Positive for: Crohns Negative for: Cirrhosis ; Colitis; Hepatitis A; Hepatitis B; Hepatitis C Past Medical History Notes: incontienent Endocrine Medical History: Negative for: Type I Diabetes; Type II Diabetes Genitourinary Medical History: Negative for: End Stage Renal Disease Past Medical History Notes: incontinent; CKD stage 4 Immunological Medical History: Negative for: Lupus Erythematosus; Raynauds; Scleroderma Integumentary (Skin) Medical History: Negative for: History of Burn Musculoskeletal Medical History: Negative for: Gout;  Rheumatoid Arthritis; Osteoarthritis; Osteomyelitis Neurologic Medical History: Positive for: Dementia Negative for: Neuropathy; Quadriplegia; Paraplegia; Seizure Disorder Past Medical History Notes: hx TIA; alzheimer's Oncologic Medical History: Negative for: Received Chemotherapy; Received Radiation Psychiatric Medical History: Negative for: Anorexia/bulimia; Confinement Anxiety Immunizations Pneumococcal Vaccine: Received Pneumococcal Vaccination: No Implantable Devices None Hospitalization / Surgery History Type of Hospitalization/Surgery left heart cath and coronary angiography cardiac cath EGD colectomy dilation and curettage of uterus lap right hemicolectomy Family and Social History Unknown History: Yes; Never smoker; Marital Status - Married; Alcohol Use: Never; Drug Use: No History; Caffeine Use: Never; Financial Concerns: No; Food, Clothing or Shelter Needs: No; Support System Lacking: No; Transportation Concerns: No Electronic Signature(s) Signed: 08/28/2021 4:56:42 PM By: Kalman Shan DO Signed: 08/29/2021 4:38:08 PM By: Lorrin Jackson Entered By: Kalman Shan on 08/28/2021 16:53:28 -------------------------------------------------------------------------------- SuperBill Details Patient Name: Date of Service: [name], [name] 08/28/2021 Medical Record Number: [id number] Patient Account Number: [id number] Date of Birth/Sex: Treating RN: [birth year] (86 y.o. Debby Bud Primary Care Provider: Clent Demark Other Clinician: Referring Provider: Treating Provider/Extender: Edrick Kins Weeks in Treatment: 27 Diagnosis Coding ICD-10 Codes Code Description [phone]  Unspecified open wound, left lower leg, subsequent encounter S91.001D Unspecified open wound, right ankle, subsequent encounter S91.302D Unspecified open wound, left foot, subsequent encounter L89.223 Pressure ulcer of left hip, stage 3 G30.9 Alzheimer's  disease, unspecified I10 Essential (primary) hypertension N18.9 Chronic kidney disease, unspecified Facility Procedures CPT4 Code: [id number] Description: 76546 - WOUND CARE VISIT-LEV 5 EST PT Modifier: Quantity: 1 Physician Procedures : CPT4 Code Description Modifier [id number] - WC PHYS LEVEL 3 - EST PT ICD-10 Diagnosis Description L89.223 Pressure ulcer of left hip, stage 3 S91.001D Unspecified open wound, right ankle, subsequent encounter S91.302D Unspecified open wound, left  foot, subsequent encounter G30.9 Alzheimer's disease, unspecified Quantity: 1 Electronic Signature(s) Signed: 08/28/2021 4:56:42 PM By: Kalman Shan DO Previous Signature: 08/28/2021 4:37:29 PM Version By: Deon Pilling RN, BSN Entered By: Kalman Shan on 08/28/2021 16:56:21

## 2021-08-29 NOTE — Progress Notes (Signed)
[name], [name] ([id number]) Visit Report for 08/28/2021 Arrival Information Details Patient Name: Date of Service: [name], [name] 08/28/2021 2:45 PM Medical Record Number: [id number] Patient Account Number: [id number] Date of Birth/Sex: Treating RN: 1935/04/13 (86 y.o. [name], Meta.Reding Primary Care Kallan Bischoff: Clent Demark Other Clinician: Referring Linzee Depaul: Treating Demarr Kluever/Extender: Collene Schlichter in Treatment: 3 Visit Information History Since Last Visit Added or deleted any medications: No Patient Arrived: Wheel Chair Any new allergies or adverse reactions: No Arrival Time: 15:05 Had a fall or experienced change in No Accompanied By: son activities of daily living that may affect Transfer Assistance: Manual risk of falls: Patient Identification Verified: Yes Signs or symptoms of abuse/neglect since last visito No Secondary Verification Process Completed: Yes Hospitalized since last visit: No Patient Requires Transmission-Based Precautions: No Implantable device outside of the clinic excluding No Patient Has Alerts: No cellular tissue based products placed in the center since last visit: Has Dressing in Place as Prescribed: Yes Pain Present Now: No Electronic Signature(s) Signed: 08/28/2021 4:37:29 PM By: Deon Pilling RN, BSN Entered By: Deon Pilling on 08/28/2021 15:12:25 -------------------------------------------------------------------------------- Clinic Level of Care Assessment Details Patient Name: Date of Service: [name], Rhonda Rhonda G. 08/28/2021 2:45 PM Medical Record Number: [id number] Patient Account Number: [id number] Date of Birth/Sex: Treating RN: 1935/02/17 (86 y.o. [name], Meta.Reding Primary Care Genae Strine: Clent Demark Other Clinician: Referring Malie Kashani: Treating Lemarcus Baggerly/Extender: Collene Schlichter in Treatment: 7 Clinic Level of Care Assessment Items TOOL 4 Quantity Score X- 1 0 Use when  only an EandM is performed on FOLLOW-UP visit ASSESSMENTS - Nursing Assessment / Reassessment X- 1 10 Reassessment of Co-morbidities (includes updates in patient status) X- 1 5 Reassessment of Adherence to Treatment Plan ASSESSMENTS - Wound and Skin A ssessment / Reassessment []  - 0 Simple Wound Assessment / Reassessment - one wound X- 3 5 Complex Wound Assessment / Reassessment - multiple wounds X- 1 10 Dermatologic / Skin Assessment (not related to wound area) ASSESSMENTS - Focused Assessment X- 2 5 Circumferential Edema Measurements - multi extremities X- 1 10 Nutritional Assessment / Counseling / Intervention []  - 0 Lower Extremity Assessment (monofilament, tuning fork, pulses) []  - 0 Peripheral Arterial Disease Assessment (using hand held doppler) ASSESSMENTS - Ostomy and/or Continence Assessment and Care []  - 0 Incontinence Assessment and Management []  - 0 Ostomy Care Assessment and Management (repouching, etc.) PROCESS - Coordination of Care []  - 0 Simple Patient / Family Education for ongoing care X- 1 20 Complex (extensive) Patient / Family Education for ongoing care X- 1 10 Staff obtains Programmer, systems, Records, T Results / Process Orders est X- 1 10 Staff telephones HHA, Nursing Homes / Clarify orders / etc []  - 0 Routine Transfer to another Facility (non-emergent condition) []  - 0 Routine Hospital Admission (non-emergent condition) []  - 0 New Admissions / Biomedical engineer / Ordering NPWT Apligraf, etc. , []  - 0 Emergency Hospital Admission (emergent condition) []  - 0 Simple Discharge Coordination X- 1 15 Complex (extensive) Discharge Coordination PROCESS - Special Needs []  - 0 Pediatric / Minor Patient Management []  - 0 Isolation Patient Management []  - 0 Hearing / Language / Visual special needs []  - 0 Assessment of Community assistance (transportation, D/C planning, etc.) []  - 0 Additional assistance / Altered mentation []  - 0 Support  Surface(s) Assessment (bed, cushion, seat, etc.) INTERVENTIONS - Wound Cleansing / Measurement []  - 0 Simple Wound Cleansing - one wound X- 3 5 Complex Wound  Cleansing - multiple wounds X- 1 5 Wound Imaging (photographs - any number of wounds) []  - 0 Wound Tracing (instead of photographs) []  - 0 Simple Wound Measurement - one wound X- 3 5 Complex Wound Measurement - multiple wounds INTERVENTIONS - Wound Dressings X - Small Wound Dressing one or multiple wounds 3 10 []  - 0 Medium Wound Dressing one or multiple wounds []  - 0 Large Wound Dressing one or multiple wounds []  - 0 Application of Medications - topical []  - 0 Application of Medications - injection INTERVENTIONS - Miscellaneous []  - 0 External ear exam []  - 0 Specimen Collection (cultures, biopsies, blood, body fluids, etc.) []  - 0 Specimen(s) / Culture(s) sent or taken to Lab for analysis []  - 0 Patient Transfer (multiple staff / Civil Service fast streamer / Similar devices) []  - 0 Simple Staple / Suture removal (25 or less) []  - 0 Complex Staple / Suture removal (26 or more) []  - 0 Hypo / Hyperglycemic Management (close monitor of Blood Glucose) []  - 0 Ankle / Brachial Index (ABI) - do not check if billed separately X- 1 5 Vital Signs Has the patient been seen at the hospital within the last three years: Yes Total Score: 185 Level Of Care: New/Established - Level 5 Electronic Signature(s) Signed: 08/28/2021 4:37:29 PM By: Deon Pilling RN, BSN Entered By: Deon Pilling on 08/28/2021 15:54:37 -------------------------------------------------------------------------------- Encounter Discharge Information Details Patient Name: Date of Service: [place] [name], Rhonda Rhonda G. 08/28/2021 2:45 PM Medical Record Number: [id number] Patient Account Number: [id number] Date of Birth/Sex: Treating RN: May 12, 1935 (86 y.o. [name] Primary Care Benjimin Hadden: Clent Demark Other Clinician: Referring Emidio Warrell: Treating Yarixa Lightcap/Extender:  Collene Schlichter in Treatment: 82 Encounter Discharge Information Items Discharge Condition: Stable Ambulatory Status: Wheelchair Discharge Destination: Home Transportation: Private Auto Accompanied By: son Schedule Follow-up Appointment: Yes Clinical Summary of Care: Electronic Signature(s) Signed: 08/28/2021 4:37:29 PM By: Deon Pilling RN, BSN Entered By: Deon Pilling on 08/28/2021 15:55:42 -------------------------------------------------------------------------------- Lower Extremity Assessment Details Patient Name: Date of Service: [name], Rhonda Rhonda G. 08/28/2021 2:45 PM Medical Record Number: [id number] Patient Account Number: [id number] Date of Birth/Sex: Treating RN: Sep 28, 1935 (86 y.o. [name] Primary Care Khylah Kendra: Clent Demark Other Clinician: Referring Kyley Solow: Treating Audrena Talaga/Extender: Edrick Kins Weeks in Treatment: 27 Edema Assessment Assessed: [Left: Yes] [Right: Yes] Edema: [Left: No] [Right: No] Calf Left: Right: Point of Measurement: From Medial Instep 22 cm 22.5 cm Ankle Left: Right: Point of Measurement: From Medial Instep 18 cm 18.5 cm Vascular Assessment Pulses: Dorsalis Pedis Palpable: [Left:Yes] [Right:Yes] Electronic Signature(s) Signed: 08/28/2021 4:37:29 PM By: Deon Pilling RN, BSN Entered By: Deon Pilling on 08/28/2021 15:13:34 -------------------------------------------------------------------------------- Multi Wound Chart Details Patient Name: Date of Service: MA [name], Rhonda Rhonda G. 08/28/2021 2:45 PM Medical Record Number: [id number] Patient Account Number: [id number] Date of Birth/Sex: Treating RN: 12/09/1934 (86 y.o. [name] Primary Care Dayron Odland: Clent Demark Other Clinician: Referring Courtni Balash: Treating Conrado Nance/Extender: Edrick Kins Weeks in Treatment: 46 Vital Signs Height(in): 85 Pulse(bpm): 75 Weight(lbs): 30 Blood  Pressure(mmHg): 118/79 Body Mass Index(BMI): 14 Temperature(F): 97.5 Respiratory Rate(breaths/min): 16 Photos: Left Trochanter Left, Plantar Foot Right, Lateral Ankle Wound Location: Gradually Appeared Pressure Injury Pressure Injury Wounding Event: Pressure Ulcer Pressure Ulcer Pressure Ulcer Primary Etiology: Hypertension, Myocardial Infarction, Hypertension, Myocardial Infarction, Hypertension, Myocardial Infarction, Comorbid History: Crohns, Dementia Crohns, Dementia Crohns, Dementia 02/05/2021 05/01/2021 05/01/2021 Date Acquired: 27 16 16  Weeks of Treatment: Open Healed - Epithelialized Healed -  Epithelialized Wound Status: 0.7x0.4x1 0x0x0 0x0x0 Measurements L x W x D (cm) 0.22 0 0 A (cm) : rea 0.22 0 0 Volume (cm) : 98.10% 100.00% 100.00% % Reduction in A rea: 99.10% 100.00% 100.00% % Reduction in Volume: 9 Position 1 (o'clock): 4 Maximum Distance 1 (cm): Yes No No Tunneling: Category/Stage III Category/Stage II Category/Stage II Classification: Medium None Present Medium Exudate A mount: Purulent N/A Serosanguineous Exudate Type: yellow, brown, green N/A red, brown Exudate Color: Well defined, not attached Distinct, outline attached Distinct, outline attached Wound Margin: Large (67-100%) None Present (0%) None Present (0%) Granulation A mount: Red, Pink N/A N/A Granulation Quality: None Present (0%) None Present (0%) Large (67-100%) Necrotic A mount: Fat Layer (Subcutaneous Tissue): Yes Fat Layer (Subcutaneous Tissue): Yes Fat Layer (Subcutaneous Tissue): Yes Exposed Structures: Fascia: No Fascia: No Fascia: No Tendon: No Tendon: No Tendon: No Muscle: No Muscle: No Muscle: No Joint: No Joint: No Joint: No Bone: No Bone: No Bone: No Small (1-33%) Large (67-100%) Medium (34-66%) Epithelialization: N/A intact blood blister. N/A Assessment Notes: Treatment Notes Wound #1 (Trochanter) Wound Laterality: Left Cleanser Soap and  Water Discharge Instruction: May shower and wash wound with dial antibacterial soap and water prior to dressing change. Wound Cleanser Discharge Instruction: Cleanse the wound with wound cleanser or normal saline prior to applying a clean dressing using gauze sponges, not tissue or cotton balls. Peri-Wound Care Zinc Oxide Ointment 30g tube Discharge Instruction: Apply Zinc Oxide to excoriated areas Topical Primary Dressing Promogran Prisma Matrix, 4.34 (sq in) (silver collagen) Discharge Instruction: Moisten collagen with saline or hydrogel/KY Jelly lightly pack into wound bed. wet to dry gauze Discharge Instruction: saline or KY jelly moisten gauze backing the prisma. Secondary Dressing Bordered Gauze, 4x4 in Discharge Instruction: Apply over primary dressing as directed. Secured With Compression Wrap Compression Stockings Add-Ons Notes pad with foam border left foot and right ankle. Wound #4 (Foot) Wound Laterality: Plantar, Left Cleanser Peri-Wound Care Topical Primary Dressing Secondary Dressing Secured With Compression Wrap Compression Stockings Add-Ons Notes pad with foam border left foot and right ankle. Wound #6 (Ankle) Wound Laterality: Right, Lateral Cleanser Peri-Wound Care Topical Primary Dressing Secondary Dressing Secured With Compression Wrap Compression Stockings Add-Ons Notes pad with foam border left foot and right ankle. Electronic Signature(s) Signed: 08/28/2021 4:56:42 PM By: Kalman Shan DO Signed: 08/29/2021 4:38:08 PM By: Lorrin Jackson Entered By: Kalman Shan on 08/28/2021 16:50:05 -------------------------------------------------------------------------------- Multi-Disciplinary Care Plan Details Patient Name: Date of Service: [place] [name], Rhonda Rhonda G. 08/28/2021 2:45 PM Medical Record Number: [id number] Patient Account Number: [id number] Date of Birth/Sex: Treating RN: [birth year] (86 y.o. [name] Primary Care Danille Oppedisano:  Clent Demark Other Clinician: Referring Misako Roeder: Treating Citlali Gautney/Extender: Collene Schlichter in Treatment: [place] reviewed with physician Active Inactive Pressure Nursing Diagnoses: Knowledge deficit related to causes and risk factors for pressure ulcer development Knowledge deficit related to management of pressures ulcers Potential for impaired tissue integrity related to pressure, friction, moisture, and shear Goals: Patient/caregiver will verbalize risk factors for pressure ulcer development Date Initiated: 02/20/2021 Target Resolution Date: 09/14/2021 Goal Status: Active Patient/caregiver will verbalize understanding of pressure ulcer management Date Initiated: 02/20/2021 Date Inactivated: 03/23/2021 Target Resolution Date: 03/24/2021 Goal Status: Met Interventions: Assess: immobility, friction, shearing, incontinence upon admission and as needed Assess offloading mechanisms upon admission and as needed Assess potential for pressure ulcer upon admission and as needed Provide education on pressure ulcers Notes: Wound/Skin Impairment Nursing Diagnoses: Impaired tissue integrity Knowledge deficit  related to ulceration/compromised skin integrity Goals: Patient/caregiver will verbalize understanding of skin care regimen Date Initiated: 02/20/2021 Target Resolution Date: 10/05/2021 Goal Status: Active Ulcer/skin breakdown will have a volume reduction of 30% by week 4 Date Initiated: 02/20/2021 Date Inactivated: 03/23/2021 Target Resolution Date: 03/24/2021 Unmet Reason: deeper according to Goal Status: Unmet wound care measurements. Interventions: Assess patient/caregiver ability to obtain necessary supplies Assess patient/caregiver ability to perform ulcer/skin care regimen upon admission and as needed Assess ulceration(s) every visit Provide education on ulcer and skin care Notes: Electronic Signature(s) Signed: 08/28/2021  4:37:29 PM By: Deon Pilling RN, BSN Entered By: Deon Pilling on 08/28/2021 15:18:12 -------------------------------------------------------------------------------- Pain Assessment Details Patient Name: Date of Service: [name], Rhonda Rhonda G. 08/28/2021 2:45 PM Medical Record Number: [id number] Patient Account Number: [id number] Date of Birth/Sex: Treating RN: 1934-12-24 (86 y.o. [name] Primary Care Jaymar Loeber: Clent Demark Other Clinician: Referring Aryona Sill: Treating Joaopedro Eschbach/Extender: Collene Schlichter in Treatment: 63 Active Problems Location of Pain Severity and Description of Pain Patient Has Paino No Site Locations Rate the pain. Current Pain Level: 0 Pain Management and Medication Current Pain Management: Medication: No Cold Application: No Rest: No Massage: No Activity: No T.E.N.S.: No Heat Application: No Leg drop or elevation: No Is the Current Pain Management Adequate: Adequate How does your wound impact your activities of daily livingo Sleep: No Bathing: No Appetite: No Relationship With Others: No Bladder Continence: No Emotions: No Bowel Continence: No Work: No Toileting: No Drive: No Dressing: No Hobbies: No Engineer, maintenance) Signed: 08/28/2021 4:37:29 PM By: Deon Pilling RN, BSN Entered By: Deon Pilling on 08/28/2021 15:12:56 -------------------------------------------------------------------------------- Patient/Caregiver Education Details Patient Name: Date of Service: [place] [name] Rhonda Darnell Level 11/14/2022andnbsp2:45 PM Medical Record Number: [id number] Patient Account Number: [id number] Date of Birth/Gender: Treating RN: Sep 06, 1935 (86 y.o. [name] Primary Care Physician: Clent Demark Other Clinician: Referring Physician: Treating Physician/Extender: Collene Schlichter in Treatment: 50 Education Assessment Education Provided To: Patient and Caregiver Education Topics  Provided Wound/Skin Impairment: Handouts: Skin Care Do's and Dont's Methods: Explain/Verbal Responses: Reinforcements needed Electronic Signature(s) Signed: 08/28/2021 4:37:29 PM By: Deon Pilling RN, BSN Entered By: Deon Pilling on 08/28/2021 15:18:24 -------------------------------------------------------------------------------- Wound Assessment Details Patient Name: Date of Service: [place] [name], Rhonda Rhonda G. 08/28/2021 2:45 PM Medical Record Number: [id number] Patient Account Number: [id number] Date of Birth/Sex: Treating RN: October 01, 1935 (86 y.o. [name], Meta.Reding Primary Care Marieanne Marxen: Clent Demark Other Clinician: Referring Raguel Kosloski: Treating Nickolus Wadding/Extender: Edrick Kins Weeks in Treatment: 27 Wound Status Wound Number: 1 Primary Etiology: Pressure Ulcer Wound Location: Left Trochanter Wound Status: Open Wounding Event: Gradually Appeared Comorbid History: Hypertension, Myocardial Infarction, Crohns, Dementia Date Acquired: 02/05/2021 Weeks Of Treatment: 27 Clustered Wound: No Photos Wound Measurements Length: (cm) 0.7 Width: (cm) 0.4 Depth: (cm) 1 Area: (cm) 0.22 Volume: (cm) 0.22 % Reduction in Area: 98.1% % Reduction in Volume: 99.1% Epithelialization: Small (1-33%) Tunneling: Yes Position (o'clock): 9 Maximum Distance: (cm) 4 Undermining: No Wound Description Classification: Category/Stage III Wound Margin: Well defined, not attached Exudate Amount: Medium Exudate Type: Purulent Exudate Color: yellow, brown, green Foul Odor After Cleansing: No Slough/Fibrino No Wound Bed Granulation Amount: Large (67-100%) Exposed Structure Granulation Quality: Red, Pink Fascia Exposed: No Necrotic Amount: None Present (0%) Fat Layer (Subcutaneous Tissue) Exposed: Yes Tendon Exposed: No Muscle Exposed: No Joint Exposed: No Bone Exposed: No Treatment Notes Wound #1 (Trochanter) Wound Laterality: Left Cleanser Soap and Water Discharge  Instruction: May shower and  wash wound with dial antibacterial soap and water prior to dressing change. Wound Cleanser Discharge Instruction: Cleanse the wound with wound cleanser or normal saline prior to applying a clean dressing using gauze sponges, not tissue or cotton balls. Peri-Wound Care Zinc Oxide Ointment 30g tube Discharge Instruction: Apply Zinc Oxide to excoriated areas Topical Primary Dressing Promogran Prisma Matrix, 4.34 (sq in) (silver collagen) Discharge Instruction: Moisten collagen with saline or hydrogel/KY Jelly lightly pack into wound bed. wet to dry gauze Discharge Instruction: saline or KY jelly moisten gauze backing the prisma. Secondary Dressing Bordered Gauze, 4x4 in Discharge Instruction: Apply over primary dressing as directed. Secured With Compression Wrap Compression Stockings Add-Ons Notes pad with foam border left foot and right ankle. Electronic Signature(s) Signed: 08/28/2021 4:37:29 PM By: Deon Pilling RN, BSN Signed: 08/29/2021 1:54:26 PM By: Sandre Kitty Entered By: Sandre Kitty on 08/28/2021 15:18:17 -------------------------------------------------------------------------------- Wound Assessment Details Patient Name: Date of Service: MA [name], Rhonda Rhonda G. 08/28/2021 2:45 PM Medical Record Number: [id number] Patient Account Number: [id number] Date of Birth/Sex: Treating RN: [birth year] (86 y.o. [name], Meta.Reding Primary Care Jniyah Dantuono: Clent Demark Other Clinician: Referring Rieley Hausman: Treating Bali Lyn/Extender: Edrick Kins Weeks in Treatment: 27 Wound Status Wound Number: 4 Primary Etiology: Pressure Ulcer Wound Location: Left, Plantar Foot Wound Status: Healed - Epithelialized Wounding Event: Pressure Injury Comorbid History: Hypertension, Myocardial Infarction, Crohns, Dementia Date Acquired: 05/01/2021 Weeks Of Treatment: 16 Clustered Wound: No Photos Wound Measurements Length: (cm) 0 Width: (cm)  0 Depth: (cm) 0 Area: (cm) 0 Volume: (cm) 0 % Reduction in Area: 100% % Reduction in Volume: 100% Epithelialization: Large (67-100%) Tunneling: No Undermining: No Wound Description Classification: Category/Stage II Wound Margin: Distinct, outline attached Exudate Amount: None Present Foul Odor After Cleansing: No Slough/Fibrino No Wound Bed Granulation Amount: None Present (0%) Exposed Structure Necrotic Amount: None Present (0%) Fascia Exposed: No Fat Layer (Subcutaneous Tissue) Exposed: Yes Tendon Exposed: No Muscle Exposed: No Joint Exposed: No Bone Exposed: No Assessment Notes intact blood blister. Treatment Notes Wound #4 (Foot) Wound Laterality: Plantar, Left Cleanser Peri-Wound Care Topical Primary Dressing Secondary Dressing Secured With Compression Wrap Compression Stockings Add-Ons Notes pad with foam border left foot and right ankle. Electronic Signature(s) Signed: 08/28/2021 4:37:29 PM By: Deon Pilling RN, BSN Entered By: Deon Pilling on 08/28/2021 15:50:13 -------------------------------------------------------------------------------- Wound Assessment Details Patient Name: Date of Service: [place] [name], Rhonda Rhonda G. 08/28/2021 2:45 PM Medical Record Number: [id number] Patient Account Number: [id number] Date of Birth/Sex: Treating RN: October 01, 1935 (86 y.o. [name], Meta.Reding Primary Care Letricia Krinsky: Clent Demark Other Clinician: Referring Lillianne Eick: Treating Clarion Mooneyhan/Extender: Edrick Kins Weeks in Treatment: 27 Wound Status Wound Number: 6 Primary Etiology: Pressure Ulcer Wound Location: Right, Lateral Ankle Wound Status: Healed - Epithelialized Wounding Event: Pressure Injury Comorbid History: Hypertension, Myocardial Infarction, Crohns, Dementia Date Acquired: 05/01/2021 Weeks Of Treatment: 16 Clustered Wound: No Photos Wound Measurements Length: (cm) Width: (cm) Depth: (cm) Area: (cm) Volume: (cm) 0 % Reduction  in Area: 100% 0 % Reduction in Volume: 100% 0 Epithelialization: Medium (34-66%) 0 Tunneling: No 0 Undermining: No Wound Description Classification: Category/Stage II Wound Margin: Distinct, outline attached Exudate Amount: Medium Exudate Type: Serosanguineous Exudate Color: red, brown Foul Odor After Cleansing: No Slough/Fibrino Yes Wound Bed Granulation Amount: None Present (0%) Exposed Structure Necrotic Amount: Large (67-100%) Fascia Exposed: No Fat Layer (Subcutaneous Tissue) Exposed: Yes Tendon Exposed: No Muscle Exposed: No Joint Exposed: No Bone Exposed: No Treatment Notes Wound #6 (Ankle) Wound Laterality: Right, Lateral Cleanser  Peri-Wound Care Topical Primary Dressing Secondary Dressing Secured With Compression Wrap Compression Stockings Add-Ons Notes pad with foam border left foot and right ankle. Electronic Signature(s) Signed: 08/28/2021 4:37:29 PM By: Deon Pilling RN, BSN Entered By: Deon Pilling on 08/28/2021 15:50:26 -------------------------------------------------------------------------------- Vitals Details Patient Name: Date of Service: [name], Rhonda Rhonda G. 08/28/2021 2:45 PM Medical Record Number: [id number] Patient Account Number: [id number] Date of Birth/Sex: Treating RN: [birth year] (86 y.o. [name], Meta.Reding Primary Care Alzora Ha: Clent Demark Other Clinician: Referring Faust Thorington: Treating Angelita Harnack/Extender: Edrick Kins Weeks in Treatment: 43 Vital Signs Time Taken: 15:10 Temperature (F): 97.5 Height (in): 63 Pulse (bpm): 75 Weight (lbs): 80 Respiratory Rate (breaths/min): 16 Body Mass Index (BMI): 14.2 Blood Pressure (mmHg): 118/79 Reference Range: 80 - 120 mg / dl Electronic Signature(s) Signed: 08/28/2021 4:37:29 PM By: Deon Pilling RN, BSN Entered By: Deon Pilling on 08/28/2021 15:12:43

## 2021-10-03 ENCOUNTER — Encounter (HOSPITAL_BASED_OUTPATIENT_CLINIC_OR_DEPARTMENT_OTHER): Payer: Medicare Other | Admitting: Internal Medicine

## 2021-10-23 ENCOUNTER — Other Ambulatory Visit: Payer: Self-pay | Admitting: Physician Assistant

## 2021-10-24 NOTE — Telephone Encounter (Signed)
Patient's son [name] called and said his mom is just about out of her Abilify, the generic form.  She is completely out of the medicine.

## 2021-12-06 ENCOUNTER — Other Ambulatory Visit: Payer: Self-pay | Admitting: Physician Assistant

## 2021-12-08 ENCOUNTER — Other Ambulatory Visit: Payer: Self-pay

## 2021-12-08 ENCOUNTER — Telehealth: Payer: Self-pay | Admitting: Physician Assistant

## 2021-12-08 MED ORDER — ARIPIPRAZOLE 5 MG PO TABS: 5.0000 mg | ORAL_TABLET | Freq: Every day | ORAL | 3 refills | Status: AC

## 2021-12-08 MED ORDER — QUETIAPINE FUMARATE 25 MG PO TABS: 12.5000 mg | ORAL_TABLET | Freq: Every day | ORAL | 3 refills | Status: AC

## 2021-12-08 NOTE — Telephone Encounter (Signed)
1. Which medications need refilled? (List name and dosage, if known) aripiprazole and quetiapine - scheduled follow up  2. Which pharmacy/location is medication to be sent to? (include street and city if local pharmacy) [place]

## 2022-01-29 NOTE — Progress Notes (Signed)
AARIONNA, GERMER ([id number]) ?Visit Report for 01/30/2022 ?Allergy List Details ?Patient Name: Date of Service: ?[name] [name] 01/30/2022 1:15 PM ?Medical Record Number: [id number] ?Patient Account Number: [id number] ?Date of Birth/Sex: Treating RN: ?1935/05/18 (87 y.o. Female) Lorrin Jackson ?Primary Care Heidi Maclin: Clent Demark Other Clinician: ?Referring Sherran Margolis: ?Treating Lavelle Berland/Extender: Kalman Shan ?Charolette Forward N ?Weeks in Treatment: 0 ?Allergies ?Active Allergies ?Sulfa (Sulfonamide Antibiotics) ?codeine ?Reaction: N/V ?Allergy Notes ?Electronic Signature(s) ?Signed: 01/29/2022 8:51:40 AM By: Lorrin Jackson ?Entered By: Lorrin Jackson on 01/29/2022 08:51:39 ?

## 2022-01-30 ENCOUNTER — Encounter (HOSPITAL_BASED_OUTPATIENT_CLINIC_OR_DEPARTMENT_OTHER): Payer: Medicare Other | Admitting: Internal Medicine

## 2022-02-12 ENCOUNTER — Encounter (HOSPITAL_BASED_OUTPATIENT_CLINIC_OR_DEPARTMENT_OTHER): Payer: Medicare Other | Attending: Internal Medicine | Admitting: Internal Medicine

## 2022-02-12 DIAGNOSIS — G309 Alzheimer's disease, unspecified: Secondary | ICD-10-CM | POA: Insufficient documentation

## 2022-02-12 DIAGNOSIS — F02C0 Dementia in other diseases classified elsewhere, severe, without behavioral disturbance, psychotic disturbance, mood disturbance, and anxiety: Secondary | ICD-10-CM | POA: Diagnosis not present

## 2022-02-12 DIAGNOSIS — I129 Hypertensive chronic kidney disease with stage 1 through stage 4 chronic kidney disease, or unspecified chronic kidney disease: Secondary | ICD-10-CM | POA: Insufficient documentation

## 2022-02-12 DIAGNOSIS — L89223 Pressure ulcer of left hip, stage 3: Secondary | ICD-10-CM | POA: Insufficient documentation

## 2022-02-12 DIAGNOSIS — N184 Chronic kidney disease, stage 4 (severe): Secondary | ICD-10-CM | POA: Diagnosis not present

## 2022-02-12 DIAGNOSIS — E1122 Type 2 diabetes mellitus with diabetic chronic kidney disease: Secondary | ICD-10-CM | POA: Diagnosis not present

## 2022-02-12 NOTE — Progress Notes (Signed)
BEVERELY, SUEN ([id number]) ?Visit Report for 02/12/2022 ?Abuse Risk Screen Details ?Patient Name: Date of Service: ?[name], [name] 02/12/2022 1:15 PM ?Medical Record Number: [id number] ?Patient Account Number: [id number] ?Date of Birth/Sex: Treating RN: ?Apr 08, 1935 (87 y.o. [name] ?Primary Care [name]: [name] Other Clinician: ?Referring [name]: ?Treating [name]/Extender: [name] ?[name] N ?Weeks in Treatment: 0 ?Abuse Risk Screen Items ?Answer ?ABUSE RISK SCREEN: ?Has anyone close to you tried to hurt or harm you recentlyo No ?Do you feel uncomfortable with anyone in your familyo No ?Has anyone forced you do things that you didnt want to doo No ?Electronic Signature(s) ?Signed: 02/12/2022 5:13:39 PM By: Lorrin Jackson ?Entered By: Lorrin Jackson on 02/12/2022 13:48:22 ?-------------------------------------------------------------------------------- ?Activities of Daily Living Details ?Patient Name: Date of Service: ?[name], [name] 02/12/2022 1:15 PM ?Medical Record Number: [id number] ?Patient Account Number: [id number] ?Date of Birth/Sex: Treating RN: ?1935-02-24 (87 y.o. [name] ?Primary Care [name]: [name] Other Clinician: ?Referring [name]: ?Treating [name]/Extender: [name] ?[name] N ?Weeks in Treatment: 0 ?Activities of Daily Living Items ?Answer ?Activities of Daily Living (Please select one for each item) ?Drive Automobile Not Able ?T Medications ?ake Need Assistance ?Use T elephone Not Able ?Care for Appearance Not Able ?Use T oilet Need Assistance ?Bath / Shower Need Assistance ?Dress Self Need Assistance ?Feed Self Need Assistance ?Walk Not Able ?Get In / Out Bed Need Assistance ?Housework Not Able ?Prepare Meals Not Able ?Handle Money Not Able ?Shop for Self Not Able ?Electronic Signature(s) ?Signed: 02/12/2022 5:13:39 PM By: Lorrin Jackson ?Entered By: Lorrin Jackson on 02/12/2022  13:49:25 ?-------------------------------------------------------------------------------- ?Education Screening Details ?Patient Name: ?Date of Service: ?Rhonda [name], Rhonda [name] 02/12/2022 1:15 PM ?Medical Record Number: [id number] ?Patient Account Number: [id number] ?Date of Birth/Sex: ?Treating RN: ?[birth year] (87 y.o. [name] ?Primary Care Markeise Mathews: [name] ?Other Clinician: ?Referring Keshav Winegar: ?Treating [name]/Extender: [name] ?[name] N ?Weeks in Treatment: 0 ?Primary Learner Assessed: Caregiver ?Reason Patient is not Primary Learner: dementia ?Learning Preferences/Education Level/Primary Language ?Learning Preference: Explanation, Demonstration, Communication Board, Printed Material ?Highest Education Level: College or Above ?Preferred Language: English ?Cognitive Barrier ?Language Barrier: No ?Translator Needed: No ?Memory Deficit: No ?Emotional Barrier: No ?Cultural/Religious Beliefs Affecting Medical Care: No ?Physical Barrier ?Impaired Vision: No ?Impaired Hearing: No ?Decreased Hand dexterity: No ?Knowledge/Comprehension ?Knowledge Level: High ?Comprehension Level: High ?Ability to understand written instructions: High ?Ability to understand verbal instructions: High ?Motivation ?Anxiety Level: Calm ?Cooperation: Cooperative ?Education Importance: Acknowledges Need ?Interest in Health Problems: Asks Questions ?Perception: Coherent ?Willingness to Engage in Self-Management High ?Activities: ?Readiness to Engage in Self-Management High ?Activities: ?Electronic Signature(s) ?Signed: 02/12/2022 5:13:39 PM By: Lorrin Jackson ?Entered By: Lorrin Jackson on 02/12/2022 13:50:07 ?-------------------------------------------------------------------------------- ?Fall Risk Assessment Details ?Patient Name: ?Date of Service: ?Rhonda [name],  [name] 02/12/2022 1:15 PM ?Medical Record Number: [id number] ?Patient Account Number: [id number] ?Date of Birth/Sex: ?Treating RN: ?Mar 02, 1935 (87 y.o. [name] ?Primary Care [name]: [name] ?Other Clinician: ?Referring Vaeda Westall: ?Treating [name]/Extender: [name] ?[name] N ?Weeks in Treatment: 0 ?Fall Risk Assessment Items ?Have you had 2 or more falls in the last 12 monthso 0 No ?Have you had any fall that resulted in injury in the last 12 monthso 0 No ?FALLS RISK SCREEN ?History of falling - immediate or within 3 months 0 No ?Secondary diagnosis (Do you have 2 or more medical diagnoseso) 15 Yes ?Ambulatory aid ?None/bed rest/wheelchair/nurse 0 Yes ?Crutches/cane/walker 0 No ?Furniture 0 No ?Intravenous  therapy Access/Saline/Heparin Lock 0 No ?Gait/Transferring ?Normal/ bed rest/ wheelchair 0 Yes ?Weak (short steps with or without shuffle, stooped but able to lift head while walking, may seek 0 No ?support from furniture) ?Impaired (short steps with shuffle, may have difficulty arising from chair, head down, impaired 0 No ?balance) ?Mental Status ?Oriented to own ability 0 Yes ?Electronic Signature(s) ?Signed: 02/12/2022 5:13:39 PM By: Lorrin Jackson ?Entered By: Lorrin Jackson on 02/12/2022 13:50:27 ?-------------------------------------------------------------------------------- ?Foot Assessment Details ?Patient Name: ?Date of Service: ?Rhonda [name], [name] [name] 02/12/2022 1:15 PM ?Medical Record Number: [id number] ?Patient Account Number: [id number] ?Date of Birth/Sex: ?Treating RN: ?1935/04/29 (87 y.o. [name] ?Primary Care [name]: [name] ?Other Clinician: ?Referring [name]: ?Treating [name]/Extender: [name] ?[name] N ?Weeks in Treatment: 0 ?Foot Assessment Items ?[x]  Unable to perform due to altered mental status ?Site Locations ?+ = Sensation present, - = Sensation absent, C = Callus, U = Ulcer ?R = Redness, W = Warmth, M = Maceration, PU = Pre-ulcerative lesion ?F = Fissure, S = Swelling, D = Dryness ?Assessment ?Right: Left: ?Other Deformity: No No ?Prior Foot Ulcer: No No ?Prior  Amputation: No No ?Charcot Joint: No No ?Ambulatory Status: Non-ambulatory ?Assistance Device: Wheelchair ?Gait: Steady ?Notes ?N/A Left Hip wound ?Electronic Signature(s) ?Signed: 02/12/2022 5:13:39 PM By: Lorrin Jackson ?Entered By: Lorrin Jackson on 02/12/2022 13:56:36 ?-------------------------------------------------------------------------------- ?Nutrition Risk Screening Details ?Patient Name: ?Date of Service: ?Rhonda [name], [place] [name] 02/12/2022 1:15 PM ?Medical Record Number: [id number] ?Patient Account Number: [id number] ?Date of Birth/Sex: ?Treating RN: ?[birth year] (87 y.o. [name] ?Primary Care Evian Derringer: [name] ?Other Clinician: ?Referring Georgia Delsignore: ?Treating Travian Kerner/Extender: [name] ?[name] N ?Weeks in Treatment: 0 ?Height (in): ?Weight (lbs): ?Body Mass Index (BMI): ?Nutrition Risk Screening Items ?Score Screening ?NUTRITION RISK SCREEN: ?I have an illness or condition that made me change the kind and/or amount of food I eat 0 No ?I eat fewer than two meals per day 0 No ?I eat few fruits and vegetables, or milk products 0 No ?I have three or more drinks of beer, liquor or wine almost every day 0 No ?I have tooth or mouth problems that make it hard for me to eat 0 No ?I don't always have enough money to buy the food I need 0 No ?I eat alone most of the time 0 No ?I take three or more different prescribed or over-the-counter drugs a day 1 Yes ?Without wanting to, I have lost or gained 10 pounds in the last six months 0 No ?I am not always physically able to shop, cook and/or feed myself 0 No ?Nutrition Protocols ?Good Risk Protocol 0 No interventions needed ?Moderate Risk Protocol ?High Risk Proctocol ?Risk Level: Good Risk ?Score: 1 ?Electronic Signature(s) ?Signed: 02/12/2022 5:13:39 PM By: Lorrin Jackson ?Entered By: Lorrin Jackson on 02/12/2022 13:50:50 ?

## 2022-02-12 NOTE — Progress Notes (Signed)
VIVIANNA, PICCINI ([id number]) ?Visit Report for 02/12/2022 ?Allergy List Details ?Patient Name: Date of Service: ?[name], [place] [name] 02/12/2022 1:15 PM ?Medical Record Number: [id number] ?Patient Account Number: [id number] ?Date of Birth/Sex: Treating RN: ?October 05, 1935 (87 y.o. [name] ?Primary Care Ameka Krigbaum: Clent Demark Other Clinician: ?Referring Nochum Fenter: ?Treating Sayf Kerner/Extender: Kalman Shan ?Charolette Forward N ?Weeks in Treatment: 0 ?Allergies ?Active Allergies ?Sulfa (Sulfonamide Antibiotics) ?Allergy Notes ?Electronic Signature(s) ?Signed: 02/12/2022 5:13:39 PM By: Lorrin Jackson ?Entered By: Lorrin Jackson on 02/12/2022 13:47:27 ?-------------------------------------------------------------------------------- ?Arrival Information Details ?Patient Name: Date of Service: ?[name], [place] [name] 02/12/2022 1:15 PM ?Medical Record Number: [id number] ?Patient Account Number: [id number] ?Date of Birth/Sex: Treating RN: ?Jul 05, 1935 (87 y.o. [name] ?Primary Care Josephene Marrone: Clent Demark Other Clinician: ?Referring Oniyah Rohe: ?Treating Mikaiya Tramble/Extender: Kalman Shan ?Charolette Forward N ?Weeks in Treatment: 0 ?Visit Information ?Patient Arrived: Wheel Chair ?Arrival Time: 13:43 ?Accompanied By: son ?Transfer Assistance: Manual ?Patient Identification Verified: Yes ?Secondary Verification Process Completed: Yes ?Patient Requires Transmission-Based Precautions: No ?Patient Has Alerts: Yes ?Patient Alerts: Patient on Blood Thinner ?History Since Last Visit ?Added or deleted any medications: No ?Any new allergies or adverse reactions: No ?Had a fall or experienced change in activities of daily living that may affect risk of falls: No ?Signs or symptoms of abuse/neglect since last visito No ?Hospitalized since last visit: No ?Implantable device outside of the clinic excluding cellular tissue based products placed in the center since last visit: No ?Has Dressing in Place as Prescribed: Yes ?Electronic  Signature(s) ?Signed: 02/12/2022 5:13:39 PM By: Lorrin Jackson ?Entered By: Lorrin Jackson on 02/12/2022 13:45:06 ?-------------------------------------------------------------------------------- ?Clinic Level of Care Assessment Details ?Patient Name: Date of Service: ?[name], [place] [name] 02/12/2022 1:15 PM ?Medical Record Number: [id number] ?Patient Account Number: [id number] ?Date of Birth/Sex: Treating RN: ?09-08-1935 (87 y.o. [name] ?Primary Care Esther Broyles: Clent Demark Other Clinician: ?Referring Shyia Fillingim: ?Treating Harsimran Westman/Extender: Kalman Shan ?Charolette Forward N ?Weeks in Treatment: 0 ?Clinic Level of Care Assessment Items ?TOOL 2 Quantity Score ?X- 1 0 ?Use when only an EandM is performed on the INITIAL visit ?ASSESSMENTS - Nursing Assessment / Reassessment ?X- 1 20 ?General Physical Exam (combine w/ comprehensive assessment (listed just below) when performed on new pt. evals) ?X- 1 25 ?Comprehensive Assessment (HX, ROS, Risk Assessments, Wounds Hx, etc.) ?ASSESSMENTS - Wound and Skin A ssessment / Reassessment ?X - Simple Wound Assessment / Reassessment - one wound 1 5 ?[]  - 0 ?Complex Wound Assessment / Reassessment - multiple wounds ?[]  - 0 ?Dermatologic / Skin Assessment (not related to wound area) ?ASSESSMENTS - Ostomy and/or Continence Assessment and Care ?[]  - 0 ?Incontinence Assessment and Management ?[]  - 0 ?Ostomy Care Assessment and Management (repouching, etc.) ?PROCESS - Coordination of Care ?[]  - 0 ?Simple Patient / Family Education for ongoing care ?X- 1 20 ?Complex (extensive) Patient / Family Education for ongoing care ?X- 1 10 ?Staff obtains Consents, Records, T Results / Process Orders ?est ?X- 1 10 ?Staff telephones HHA, Nursing Homes / Clarify orders / etc ?[]  - 0 ?Routine Transfer to another Facility (non-emergent condition) ?[]  - 0 ?Routine Hospital Admission (non-emergent condition) ?[]  - 0 ?New Admissions / Biomedical engineer / Ordering NPWT Apligraf, etc. ?, ?[]  -  0 ?Emergency Hospital Admission (emergent condition) ?[]  - 0 ?Simple Discharge Coordination ?[]  - 0 ?Complex (extensive) Discharge Coordination ?PROCESS - Special Needs ?[]  - 0 ?Pediatric / Minor Patient Management ?[]  - 0 ?Isolation Patient Management ?[]  - 0 ?Hearing / Language / Environmental health practitioner  special needs ?[]  - 0 ?Assessment of Community assistance (transportation, D/C planning, etc.) ?[]  - 0 ?Additional assistance / Altered mentation ?[]  - 0 ?Support Surface(s) Assessment (bed, cushion, seat, etc.) ?INTERVENTIONS - Wound Cleansing / Measurement ?X- 1 5 ?Wound Imaging (photographs - any number of wounds) ?[]  - 0 ?Wound Tracing (instead of photographs) ?X- 1 5 ?Simple Wound Measurement - one wound ?[]  - 0 ?Complex Wound Measurement - multiple wounds ?X- 1 5 ?Simple Wound Cleansing - one wound ?[]  - 0 ?Complex Wound Cleansing - multiple wounds ?INTERVENTIONS - Wound Dressings ?[]  - 0 ?Small Wound Dressing one or multiple wounds ?X- 1 15 ?Medium Wound Dressing one or multiple wounds ?[]  - 0 ?Large Wound Dressing one or multiple wounds ?[]  - 0 ?Application of Medications - injection ?INTERVENTIONS - Miscellaneous ?[]  - 0 ?External ear exam ?[]  - 0 ?Specimen Collection (cultures, biopsies, blood, body fluids, etc.) ?[]  - 0 ?Specimen(s) / Culture(s) sent or taken to Lab for analysis ?[]  - 0 ?Patient Transfer (multiple staff / Civil Service fast streamer / Similar devices) ?[]  - 0 ?Simple Staple / Suture removal (25 or less) ?[]  - 0 ?Complex Staple / Suture removal (26 or more) ?[]  - 0 ?Hypo / Hyperglycemic Management (close monitor of Blood Glucose) ?[]  - 0 ?Ankle / Brachial Index (ABI) - do not check if billed separately ?Has the patient been seen at the hospital within the last three years: Yes ?Total Score: 120 ?Level Of Care: New/Established - Level 4 ?Electronic Signature(s) ?Signed: 02/12/2022 5:13:39 PM By: Lorrin Jackson ?Entered By: Lorrin Jackson on 02/12/2022  14:43:05 ?-------------------------------------------------------------------------------- ?Encounter Discharge Information Details ?Patient Name: Date of Service: ?[name], [place] [name] 02/12/2022 1:15 PM ?Medical Record Number: [id number] ?Patient Account Number: [id number] ?Date of Birth/Sex: Treating RN: ?Aug 28, 1935 (87 y.o. [name] ?Primary Care Bular Hickok: Clent Demark Other Clinician: ?Referring Juniper Cobey: ?Treating Fredna Stricker/Extender: Kalman Shan ?Charolette Forward N ?Weeks in Treatment: 0 ?Encounter Discharge Information Items ?Discharge Condition: Stable ?Ambulatory Status: Wheelchair ?Discharge Destination: Home ?Transportation: Private Auto ?Accompanied By: Son ?Schedule Follow-up Appointment: Yes ?Clinical Summary of Care: Provided on 02/12/2022 ?Form Type Recipient ?Paper Patient Patient ?Electronic Signature(s) ?Signed: 02/12/2022 2:58:02 PM By: Lorrin Jackson ?Entered By: Lorrin Jackson on 02/12/2022 14:58:02 ?-------------------------------------------------------------------------------- ?Lower Extremity Assessment Details ?Patient Name: ?Date of Service: ?[name], [place] [name] 02/12/2022 1:15 PM ?Medical Record Number: [id number] ?Patient Account Number: [id number] ?Date of Birth/Sex: ?Treating RN: ?1935/09/26 (87 y.o. [name] ?Primary Care Kassy Mcenroe: Clent Demark ?Other Clinician: ?Referring Johanan Skorupski: ?Treating Yareliz Thorstenson/Extender: Kalman Shan ?Charolette Forward N ?Weeks in Treatment: 0 ?Electronic Signature(s) ?Signed: 02/12/2022 5:13:39 PM By: Lorrin Jackson ?Entered By: Lorrin Jackson on 02/12/2022 13:56:52 ?-------------------------------------------------------------------------------- ?Multi Wound Chart Details ?Patient Name: ?Date of Service: ?[name], [place] [name] 02/12/2022 1:15 PM ?Medical Record Number: [id number] ?Patient Account Number: [id number] ?Date of Birth/Sex: ?Treating RN: ?[birth year] (87 y.o. [name] ?Primary Care Riannah Stagner: Clent Demark ?Other  Clinician: ?Referring Mija Effertz: ?Treating Deisha Stull/Extender: Kalman Shan ?Charolette Forward N ?Weeks in Treatment: 0 ?Vital Signs ?Height(in): ?Pulse(bpm): 73 ?Weight(lbs): ?Blood Pressure(mmHg): 100/69 ?Body Mass Index(BMI): ?Temperature(??F): 98 ?Respiratory Rate(breaths/min): 16 ?Photos: [N/A

## 2022-02-12 NOTE — Progress Notes (Signed)
ARRETTA, TOENJES ([id number]) ?Visit Report for 02/12/2022 ?Chief Complaint Document Details ?Patient Name: Date of Service: ?[name], [place] [name] 02/12/2022 1:15 PM ?Medical Record Number: [id number] ?Patient Account Number: [id number] ?Date of Birth/Sex: Treating RN: ?[birth year] (87 y.o. [name] ?Primary Care Provider: Clent Demark Other Clinician: ?Referring Provider: ?Treating Provider/Extender: Kalman Shan ?Charolette Forward N ?Weeks in Treatment: 0 ?Information Obtained from: Patient ?Chief Complaint ?5/1; left hip wound ?Electronic Signature(s) ?Signed: 02/12/2022 2:48:29 PM By: Kalman Shan DO ?Entered By: Kalman Shan on 02/12/2022 14:42:08 ?-------------------------------------------------------------------------------- ?HPI Details ?Patient Name: Date of Service: ?[name], [place] [name] 02/12/2022 1:15 PM ?Medical Record Number: [id number] ?Patient Account Number: [id number] ?Date of Birth/Sex: Treating RN: ?1934/11/28 (87 y.o. [name] ?Primary Care Provider: Clent Demark Other Clinician: ?Referring Provider: ?Treating Provider/Extender: Kalman Shan ?Charolette Forward N ?Weeks in Treatment: 0 ?History of Present Illness ?HPI Description: Admission 5/9 ?Ms. Yale is an 87 year old female with a past medical history of hypertension, hyperlipidemia, CAD s/p MI, and Alzheimer's Disease that presents to the ?clinic today for A left hip wound. The son is present and helps provide the history. This was noticed about 2 weeks ago and there was a scab to the area. When ?this was removed there was depth to the wound. It has gotten slightly larger over the past 3 days. Patient denies any fever/chills, nausea/vomiting and states ?she has been her usual state of health. ?Per son she has ample at home care . She has an aide that comes in 5 days a week. She is able to walk with support for short distances. This is limited due to ?her advanced dementia. She usually stays in a chair for most of the  day and then transfers to a bed at night. ?5/19; patient presents for 1 week follow-up. Son was present and helped provide the history. She has been taking Augmentin without any issues. They have ?been applying Santyl daily to the wound. No complaints today. ?6/2; patient presents for 1 week follow-up. Son is present and helps provide the history. She has been using Dakin's wet-to-dry. She states she feels well and ?denies any signs of infection. ?6/9; patient presents for 1 week follow-up. Son is present and helps provide the history. He continues to do Dakin's wet-to-dry. Overall patient feels well and ?denies signs of infection. She is using her airflow mattress for about 12 hours a day. ?6/23; patient presents for 2-week follow-up. Son is present. He has been using Dakin's wet-to-dry and has noticed improvement in the wound. There are no ?issues or complaints today. She denies signs of infection. ?7/8; patient presents for 2-week follow-up. Son is present. She has been using silver alginate every other day to the wound bed. Unfortunately she has had ?some excoriation to the surrounding skin. She also developed 2 skin tears to her left leg and is not quite sure how this happened. She denies signs of infection. ?7/21; patient presents for 2-week follow-up. Son is present. She has been using Dakin wet-to-dry dressings daily. There has been improvement in the odor and ?drainage. No obvious signs of infection. ?7/28; patient presents for 1 week follow-up. There has been a significant decrease in odor and drainage since last week with the continued use of Dakin's wet- ?to-dry dressings. Patient and son would like to try a new dressing to see if this will help stimulate healing. ?8/25; patient presents for follow-up. There are no issues or complaints today. Patient denies signs of infection. ?9/19;  patient presents for follow-up. Son reports improvement in wound healing to the left trochanter wound with collagen.  Patient's been using Vaseline to the ?left foot wound and Santyl to the right lateral ankle wound. There are no issues or complaints today. ?11/14; patient presents for follow-up. Patient has had to reschedule her last 3 wound care appointments. She has been using Santyl to the right lateral foot ?wound. There is a scab t to this area removed by the intake nurse. Son is present and helps provide the history. He reports that the left foot wound has healed. ?He continues to use collagen to the left trochanter wound. He denies any signs of infection or odor to the wounds. ?02/12/2022 ?Patient was last seen 6 months ago. Son is present. Since then all her wounds have healed except for her left hip wound. They have been using collagen with ?dressing changes. The patient has had no issues and has no questions or concerns today. She denies signs of infection. ?Electronic Signature(s) ?Signed: 02/12/2022 2:48:29 PM By: Kalman Shan DO ?Entered By: Kalman Shan on 02/12/2022 14:43:13 ?-------------------------------------------------------------------------------- ?Physical Exam Details ?Patient Name: Date of Service: ?[name], [place] [name] 02/12/2022 1:15 PM ?Medical Record Number: [id number] ?Patient Account Number: [id number] ?Date of Birth/Sex: Treating RN: ?1935/07/04 (87 y.o. [name] ?Primary Care Provider: Clent Demark Other Clinician: ?Referring Provider: ?Treating Provider/Extender: Kalman Shan ?Charolette Forward N ?Weeks in Treatment: 0 ?Constitutional ?respirations regular, non-labored and within target range for patient.Marland Kitchen ?Cardiovascular ?2+ dorsalis pedis/posterior tibialis pulses. ?Psychiatric ?pleasant and cooperative. ?Notes ?Left hip: Open wound with granulation tissue at the base tunneling at the 5 o'clock position. No signs of surrounding infection. ?Electronic Signature(s) ?Signed: 02/12/2022 2:48:29 PM By: Kalman Shan DO ?Entered By: Kalman Shan on 02/12/2022  14:44:50 ?-------------------------------------------------------------------------------- ?Physician Orders Details ?Patient Name: Date of Service: ?[name], [place] [name] 02/12/2022 1:15 PM ?Medical Record Number: [id number] ?Patient Account Number: [id number] ?Date of Birth/Sex: Treating RN: ?[birth year] (87 y.o. [name] ?Primary Care Provider: Clent Demark Other Clinician: ?Referring Provider: ?Treating Provider/Extender: Kalman Shan ?Charolette Forward N ?Weeks in Treatment: 0 ?Verbal / Phone Orders: No ?Diagnosis Coding ?ICD-10 Coding ?Code Description ?I62.703 Pressure ulcer of left hip, stage 3 ?Follow-up Appointments ?ppointment in: - 2 months with Dr. Heber Seabrook [facility], Room 7) ?Return A ?Bathing/ Shower/ Hygiene ?May shower and wash wound with soap and water. - when changing dressing ?Off-Loading ?Gel mattress overlay (Group 1) ?Roho cushion for wheelchair ?Turn and reposition every 2 hours - Reduce pressure to left hip ?Home Health ?New wound care orders this week; continue Home Health for wound care. May utilize formulary equivalent dressing for wound treatment ?orders unless otherwise specified. ?Other Home Health Orders/Instructions: [facility]: 1x week for skilled nursing, wound care ?Wound Treatment ?Wound #8 - Trochanter Wound Laterality: Left ?Cleanser: Soap and Water [facility]) Every Other Day/30 Days ?Discharge Instructions: May shower and wash wound with dial antibacterial soap and water prior to dressing change. ?Cleanser: Wound Cleanser [facility]) Every Other Day/30 Days ?Discharge Instructions: Cleanse the wound with wound cleanser prior to applying a clean dressing using gauze sponges, not tissue or cotton balls. ?Peri-Wound Care: Zinc Oxide Ointment 30g tube [facility]) Every Other Day/30 Days ?Discharge Instructions: Apply Zinc Oxide to periwound with each dressing change ?Prim Dressing: Promogran Prisma Matrix, 4.34 (sq in) (silver collagen) [facility]) Every Other Day/30  Days ?ary ?Discharge Instructions: Moisten collagen with saline or hydrogel ?Secondary Dressing: Woven Gauze Sponges 2x2 in [facility]) Every  Other Day/30 Days ?Discharge Instructions: Apply saline moist gauze over Prisma ?Marylou Mccoy

## 2022-04-09 ENCOUNTER — Encounter (HOSPITAL_BASED_OUTPATIENT_CLINIC_OR_DEPARTMENT_OTHER): Payer: Medicare Other | Admitting: Internal Medicine

## 2022-04-18 ENCOUNTER — Ambulatory Visit: Payer: Medicare Other | Admitting: Physician Assistant

## 2022-04-19 ENCOUNTER — Encounter (HOSPITAL_BASED_OUTPATIENT_CLINIC_OR_DEPARTMENT_OTHER): Payer: Medicare Other | Attending: Internal Medicine | Admitting: Internal Medicine

## 2022-04-19 DIAGNOSIS — G309 Alzheimer's disease, unspecified: Secondary | ICD-10-CM | POA: Insufficient documentation

## 2022-04-19 DIAGNOSIS — F02C0 Dementia in other diseases classified elsewhere, severe, without behavioral disturbance, psychotic disturbance, mood disturbance, and anxiety: Secondary | ICD-10-CM | POA: Insufficient documentation

## 2022-04-19 DIAGNOSIS — L89223 Pressure ulcer of left hip, stage 3: Secondary | ICD-10-CM | POA: Insufficient documentation

## 2022-04-19 DIAGNOSIS — I129 Hypertensive chronic kidney disease with stage 1 through stage 4 chronic kidney disease, or unspecified chronic kidney disease: Secondary | ICD-10-CM | POA: Diagnosis not present

## 2022-04-19 NOTE — Progress Notes (Addendum)
[name], [name] ([id number]) Visit Report for 04/19/2022 Arrival Information Details Patient Name: Date of Service: [name], [place] [name] 04/19/2022 12:30 PM Medical Record Number: [id number] Patient Account Number: [id number] Date of Birth/Sex: Treating RN: 14-Jan-1935 (87 y.o. [name] Primary Care Shara Hartis: Clent Demark Other Clinician: Referring Lyzbeth Genrich: Treating Temprance Wyre/Extender: Collene Schlichter in Treatment: 9 Visit Information History Since Last Visit Added or deleted any medications: No Patient Arrived: Wheel Chair Any new allergies or adverse reactions: No Arrival Time: 12:41 Had a fall or experienced change in No Accompanied By: son activities of daily living that may affect Transfer Assistance: Manual risk of falls: Patient Identification Verified: Yes Signs or symptoms of abuse/neglect since last visito No Secondary Verification Process Completed: Yes Hospitalized since last visit: No Patient Requires Transmission-Based Precautions: No Implantable device outside of the clinic excluding No Patient Has Alerts: Yes cellular tissue based products placed in the center Patient Alerts: Patient on Blood Thinner since last visit: Has Dressing in Place as Prescribed: Yes Pain Present Now: No Electronic Signature(s) Signed: 04/19/2022 4:14:47 PM By: Lorrin Jackson Entered By: Lorrin Jackson on 04/19/2022 12:47:10 -------------------------------------------------------------------------------- Clinic Level of Care Assessment Details Patient Name: Date of Service: [name], [place] [name] 04/19/2022 12:30 PM Medical Record Number: [id number] Patient Account Number: [id number] Date of Birth/Sex: Treating RN: December 18, 1934 (87 y.o. [name] Primary Care Derryck Shahan: Clent Demark Other Clinician: Referring Valina Maes: Treating Haroldine Redler/Extender: Collene Schlichter in Treatment: 9 Clinic Level of Care Assessment Items TOOL 4  Quantity Score X- 1 0 Use when only an EandM is performed on FOLLOW-UP visit ASSESSMENTS - Nursing Assessment / Reassessment X- 1 10 Reassessment of Co-morbidities (includes updates in patient status) X- 1 5 Reassessment of Adherence to Treatment Plan ASSESSMENTS - Wound and Skin A ssessment / Reassessment X - Simple Wound Assessment / Reassessment - one wound 1 5 []  - 0 Complex Wound Assessment / Reassessment - multiple wounds []  - 0 Dermatologic / Skin Assessment (not related to wound area) ASSESSMENTS - Focused Assessment []  - 0 Circumferential Edema Measurements - multi extremities []  - 0 Nutritional Assessment / Counseling / Intervention []  - 0 Lower Extremity Assessment (monofilament, tuning fork, pulses) []  - 0 Peripheral Arterial Disease Assessment (using hand held doppler) ASSESSMENTS - Ostomy and/or Continence Assessment and Care []  - 0 Incontinence Assessment and Management []  - 0 Ostomy Care Assessment and Management (repouching, etc.) PROCESS - Coordination of Care []  - 0 Simple Patient / Family Education for ongoing care X- 1 20 Complex (extensive) Patient / Family Education for ongoing care []  - 0 Staff obtains Programmer, systems, Records, T Results / Process Orders est X- 1 10 Staff telephones HHA, Nursing Homes / Clarify orders / etc []  - 0 Routine Transfer to another Facility (non-emergent condition) []  - 0 Routine Hospital Admission (non-emergent condition) []  - 0 New Admissions / Biomedical engineer / Ordering NPWT Apligraf, etc. , []  - 0 Emergency Hospital Admission (emergent condition) []  - 0 Simple Discharge Coordination []  - 0 Complex (extensive) Discharge Coordination PROCESS - Special Needs []  - 0 Pediatric / Minor Patient Management []  - 0 Isolation Patient Management []  - 0 Hearing / Language / Visual special needs []  - 0 Assessment of Community assistance (transportation, D/C planning, etc.) []  - 0 Additional assistance / Altered  mentation []  - 0 Support Surface(s) Assessment (bed, cushion, seat, etc.) INTERVENTIONS - Wound Cleansing / Measurement X - Simple Wound Cleansing - one wound 1  5 []  - 0 Complex Wound Cleansing - multiple wounds X- 1 5 Wound Imaging (photographs - any number of wounds) []  - 0 Wound Tracing (instead of photographs) X- 1 5 Simple Wound Measurement - one wound []  - 0 Complex Wound Measurement - multiple wounds INTERVENTIONS - Wound Dressings []  - 0 Small Wound Dressing one or multiple wounds X- 1 15 Medium Wound Dressing one or multiple wounds []  - 0 Large Wound Dressing one or multiple wounds []  - 0 Application of Medications - topical []  - 0 Application of Medications - injection INTERVENTIONS - Miscellaneous []  - 0 External ear exam []  - 0 Specimen Collection (cultures, biopsies, blood, body fluids, etc.) []  - 0 Specimen(s) / Culture(s) sent or taken to Lab for analysis []  - 0 Patient Transfer (multiple staff / Civil Service fast streamer / Similar devices) []  - 0 Simple Staple / Suture removal (25 or less) []  - 0 Complex Staple / Suture removal (26 or more) []  - 0 Hypo / Hyperglycemic Management (close monitor of Blood Glucose) []  - 0 Ankle / Brachial Index (ABI) - do not check if billed separately X- 1 5 Vital Signs Has the patient been seen at the hospital within the last three years: Yes Total Score: 85 Level Of Care: New/Established - Level 3 Electronic Signature(s) Signed: 04/19/2022 4:14:47 PM By: Lorrin Jackson Entered By: Lorrin Jackson on 04/19/2022 13:21:16 -------------------------------------------------------------------------------- Encounter Discharge Information Details Patient Name: Date of Service: [name] 04/19/2022 12:30 PM Medical Record Number: [id number] Patient Account Number: [id number] Date of Birth/Sex: Treating RN: Oct 18, 1934 (87 y.o. [name] Primary Care Coben Godshall: Clent Demark Other Clinician: Referring Lash Matulich: Treating  Jayline Kilburg/Extender: Collene Schlichter in Treatment: 9 Encounter Discharge Information Items Discharge Condition: Stable Ambulatory Status: Wheelchair Discharge Destination: Home Transportation: Private Auto Accompanied By: son Schedule Follow-up Appointment: Yes Clinical Summary of Care: Provided on 04/19/2022 Form Type Recipient Paper Patient Patient Electronic Signature(s) Signed: 04/19/2022 4:14:47 PM By: Lorrin Jackson Entered By: Lorrin Jackson on 04/19/2022 13:28:01 -------------------------------------------------------------------------------- Lower Extremity Assessment Details Patient Name: Date of Service: [name] 04/19/2022 12:30 PM Medical Record Number: [id number] Patient Account Number: [id number] Date of Birth/Sex: Treating RN: 29-Apr-1935 (87 y.o. [name] Primary Care Adana Marik: Clent Demark Other Clinician: Referring Drey Shaff: Treating Reni Hausner/Extender: Edrick Kins Weeks in Treatment: 9 Electronic Signature(s) Signed: 04/19/2022 4:14:47 PM By: Lorrin Jackson Entered By: Lorrin Jackson on 04/19/2022 12:47:40 -------------------------------------------------------------------------------- Multi Wound Chart Details Patient Name: Date of Service: [name] 04/19/2022 12:30 PM Medical Record Number: [id number] Patient Account Number: [id number] Date of Birth/Sex: Treating RN: November 29, 1934 (87 y.o. [name] Primary Care Zalyn Amend: Clent Demark Other Clinician: Referring Gwenith Tschida: Treating Duriel Deery/Extender: Edrick Kins Weeks in Treatment: 9 Vital Signs Height(in): Pulse(bpm): 94 Weight(lbs): Blood Pressure(mmHg): 122/74 Body Mass Index(BMI): Temperature(F): 97.6 Respiratory Rate(breaths/min): 16 Photos: [N/A:N/A] Left Trochanter N/A N/A Wound Location: Gradually Appeared N/A N/A Wounding Event: Pressure Ulcer N/A N/A Primary Etiology: Hypertension,  Myocardial Infarction, N/A N/A Comorbid History: Crohns, Dementia 02/05/2021 N/A N/A Date Acquired: 9 N/A N/A Weeks of Treatment: Open N/A N/A Wound Status: No N/A N/A Wound Recurrence: 7x0.8x0.8 N/A N/A Measurements L x W x D (cm) 4.398 N/A N/A A (cm) : rea 3.519 N/A N/A Volume (cm) : -344.20% N/A N/A % Reduction in A rea: -344.30% N/A N/A % Reduction in Volume: 7 Starting Position 1 (o'clock): 10 Ending Position 1 (o'clock): 4 Maximum Distance 1 (  cm): Yes N/A N/A Undermining: Category/Stage III N/A N/A Classification: Medium N/A N/A Exudate A mount: Serosanguineous N/A N/A Exudate Type: red, brown N/A N/A Exudate Color: Well defined, not attached N/A N/A Wound Margin: Large (67-100%) N/A N/A Granulation A mount: Pink, Pale N/A N/A Granulation Quality: Small (1-33%) N/A N/A Necrotic A mount: Fat Layer (Subcutaneous Tissue): Yes N/A N/A Exposed Structures: Fascia: No Tendon: No Muscle: No Joint: No Bone: No None N/A N/A Epithelialization: Treatment Notes Wound #8 (Trochanter) Wound Laterality: Left Cleanser Soap and Water Discharge Instruction: May shower and wash wound with dial antibacterial soap and water prior to dressing change. Wound Cleanser Discharge Instruction: Cleanse the wound with wound cleanser prior to applying a clean dressing using gauze sponges, not tissue or cotton balls. Peri-Wound Care Zinc Oxide Ointment 30g tube Discharge Instruction: Apply Zinc Oxide to periwound with each dressing change Topical Keystone Antibiotic Compound Discharge Instruction: Moisten gauze with solution Primary Dressing Secondary Dressing Woven Gauze Sponge, Non-Sterile 4x4 in Discharge Instruction: Moisten with Keystone and pack into wound Zetuvit Plus Silicone Border Dressing 4x4 (in/in) Discharge Instruction: Apply silicone border over primary dressing as directed. Secured With Compression Wrap Compression Stockings Add-Ons Electronic  Signature(s) Signed: 04/19/2022 2:35:21 PM By: Kalman Shan DO Signed: 04/19/2022 4:14:47 PM By: Fara Chute By: Kalman Shan on 04/19/2022 13:36:25 -------------------------------------------------------------------------------- Multi-Disciplinary Care Plan Details Patient Name: Date of Service: [name] 04/19/2022 12:30 PM Medical Record Number: [id number] Patient Account Number: [id number] Date of Birth/Sex: Treating RN: 1935/10/02 (87 y.o. [name] Primary Care Delene Morais: Clent Demark Other Clinician: Referring Clifford Coudriet: Treating Rontae Inglett/Extender: Edrick Kins Weeks in Treatment: 9 Active Inactive Electronic Signature(s) Signed: 06/21/2022 1:10:05 PM By: Lorrin Jackson Previous Signature: 04/19/2022 4:14:47 PM Version By: Lorrin Jackson Entered By: Lorrin Jackson on 06/21/2022 13:10:04 -------------------------------------------------------------------------------- Pain Assessment Details Patient Name: Date of Service: [name], [name] 04/19/2022 12:30 PM Medical Record Number: [id number] Patient Account Number: [id number] Date of Birth/Sex: Treating RN: May 30, 1935 (87 y.o. [name] Primary Care Danne Vasek: Clent Demark Other Clinician: Referring Monserrat Vidaurri: Treating Kamill Fulbright/Extender: Edrick Kins Weeks in Treatment: 9 Active Problems Location of Pain Severity and Description of Pain Patient Has Paino No Site Locations Pain Management and Medication Current Pain Management: Electronic Signature(s) Signed: 04/19/2022 4:14:47 PM By: Lorrin Jackson Entered By: Lorrin Jackson on 04/19/2022 12:47:34 -------------------------------------------------------------------------------- Patient/Caregiver Education Details Patient Name: Date of Service: [place] Shelbie Proctor DIE [place] Level 7/6/2023andnbsp12:30 PM Medical Record Number: [id number] Patient Account Number: [id number] Date of Birth/Gender: Treating  RN: July 10, 1935 (87 y.o. [name] Primary Care Physician: Clent Demark Other Clinician: Referring Physician: Treating Physician/Extender: Collene Schlichter in Treatment: 9 Education Assessment Education Provided To: Caregiver Education Topics Provided Pressure: Methods: Explain/Verbal, Printed Responses: State content correctly Wound/Skin Impairment: Methods: Explain/Verbal, Printed Responses: State content correctly Electronic Signature(s) Signed: 04/19/2022 4:14:47 PM By: Lorrin Jackson Entered By: Lorrin Jackson on 04/19/2022 12:41:10 -------------------------------------------------------------------------------- Wound Assessment Details Patient Name: Date of Service: [name], [name] 04/19/2022 12:30 PM Medical Record Number: [id number] Patient Account Number: [id number] Date of Birth/Sex: Treating RN: 24-Jul-1935 (87 y.o. [name] Primary Care Emeterio Balke: Clent Demark Other Clinician: Referring Selmer Adduci: Treating Gracin Mcpartland/Extender: Edrick Kins Weeks in Treatment: 9 Wound Status Wound Number: 8 Primary Etiology: Pressure Ulcer Wound Location: Left Trochanter Wound Status: Open Wounding Event: Gradually Appeared Comorbid History: Hypertension, Myocardial Infarction, Crohns, Dementia Date Acquired: 02/05/2021 Weeks Of Treatment: 9 Clustered Wound:  No Photos Wound Measurements Length: (cm) 0.7 Width: (cm) 0.8 Depth: (cm) 0.8 Area: (cm) 0.44 Volume: (cm) 0.352 % Reduction in Area: 55.6% % Reduction in Volume: 55.6% Epithelialization: None Tunneling: No Undermining: Yes Starting Position (o'clock): 7 Ending Position (o'clock): 10 Maximum Distance: (cm) 4 Wound Description Classification: Category/Stage III Wound Margin: Well defined, not attached Exudate Amount: Medium Exudate Type: Serosanguineous Exudate Color: red, brown Foul Odor After Cleansing: No Slough/Fibrino Yes Wound  Bed Granulation Amount: Large (67-100%) Exposed Structure Granulation Quality: Pink, Pale Fascia Exposed: No Necrotic Amount: Small (1-33%) Fat Layer (Subcutaneous Tissue) Exposed: Yes Tendon Exposed: No Muscle Exposed: No Joint Exposed: No Bone Exposed: No Electronic Signature(s) Signed: 04/19/2022 2:35:21 PM By: Kalman Shan DO Signed: 04/19/2022 4:14:47 PM By: Lorrin Jackson Entered By: Kalman Shan on 04/19/2022 13:38:16 -------------------------------------------------------------------------------- Vitals Details Patient Name: Date of Service: [name], [name] 04/19/2022 12:30 PM Medical Record Number: [id number] Patient Account Number: [id number] Date of Birth/Sex: Treating RN: 1935/08/02 (87 y.o. [name] Primary Care Jarrad Mclees: Clent Demark Other Clinician: Referring Amandajo Gonder: Treating Justyna Timoney/Extender: Edrick Kins Weeks in Treatment: 9 Vital Signs Time Taken: 12:47 Temperature (F): 97.6 Pulse (bpm): 94 Respiratory Rate (breaths/min): 16 Blood Pressure (mmHg): 122/74 Reference Range: 80 - 120 mg / dl Electronic Signature(s) Signed: 04/19/2022 4:14:47 PM By: Lorrin Jackson Entered By: Lorrin Jackson on 04/19/2022 12:47:29

## 2022-04-19 NOTE — Progress Notes (Signed)
STACEY, SAGO ([id number]) Visit Report for 04/19/2022 Chief Complaint Document Details Patient Name: Date of Service: [name], [place] [name] 04/19/2022 12:30 PM Medical Record Number: [id number] Patient Account Number: [id number] Date of Birth/Sex: Treating RN: 06-08-1935 (87 y.o. [name] Primary Care Provider: Clent Demark Other Clinician: Referring Provider: Treating Provider/Extender: Edrick Kins Weeks in Treatment: 9 Information Obtained from: Patient Chief Complaint 5/1; left hip wound Electronic Signature(s) Signed: 04/19/2022 2:35:21 PM By: Kalman Shan DO Entered By: Kalman Shan on 04/19/2022 13:36:31 -------------------------------------------------------------------------------- HPI Details Patient Name: Date of Service: [name], [name] 04/19/2022 12:30 PM Medical Record Number: [id number] Patient Account Number: [id number] Date of Birth/Sex: Treating RN: 03-Apr-1935 (87 y.o. [name] Primary Care Provider: Clent Demark Other Clinician: Referring Provider: Treating Provider/Extender: Edrick Kins Weeks in Treatment: 9 History of Present Illness HPI Description: Admission 5/9 Ms. Bradt is an 87 year old female with a past medical history of hypertension, hyperlipidemia, CAD s/p MI, and Alzheimer's Disease that presents to the clinic today for A left hip wound. The son is present and helps provide the history. This was noticed about 2 weeks ago and there was a scab to the area. When this was removed there was depth to the wound. It has gotten slightly larger over the past 3 days. Patient denies any fever/chills, nausea/vomiting and states she has been her usual state of health. Per son she has ample at home care . She has an aide that comes in 5 days a week. She is able to walk with support for short distances. This is limited due to her advanced dementia. She usually stays in a chair for most of the  day and then transfers to a bed at night. 5/19; patient presents for 1 week follow-up. Son was present and helped provide the history. She has been taking Augmentin without any issues. They have been applying Santyl daily to the wound. No complaints today. 6/2; patient presents for 1 week follow-up. Son is present and helps provide the history. She has been using Dakin's wet-to-dry. She states she feels well and denies any signs of infection. 6/9; patient presents for 1 week follow-up. Son is present and helps provide the history. He continues to do Dakin's wet-to-dry. Overall patient feels well and denies signs of infection. She is using her airflow mattress for about 12 hours a day. 6/23; patient presents for 2-week follow-up. Son is present. He has been using Dakin's wet-to-dry and has noticed improvement in the wound. There are no issues or complaints today. She denies signs of infection. 7/8; patient presents for 2-week follow-up. Son is present. She has been using silver alginate every other day to the wound bed. Unfortunately she has had some excoriation to the surrounding skin. She also developed 2 skin tears to her left leg and is not quite sure how this happened. She denies signs of infection. 7/21; patient presents for 2-week follow-up. Son is present. She has been using Dakin wet-to-dry dressings daily. There has been improvement in the odor and drainage. No obvious signs of infection. 7/28; patient presents for 1 week follow-up. There has been a significant decrease in odor and drainage since last week with the continued use of Dakin's wet- to-dry dressings. Patient and son would like to try a new dressing to see if this will help stimulate healing. 8/25; patient presents for follow-up. There are no issues or complaints today. Patient denies signs of infection. 9/19;  patient presents for follow-up. Son reports improvement in wound healing to the left trochanter wound with collagen.  Patient's been using Vaseline to the left foot wound and Santyl to the right lateral ankle wound. There are no issues or complaints today. 11/14; patient presents for follow-up. Patient has had to reschedule her last 3 wound care appointments. She has been using Santyl to the right lateral foot wound. There is a scab t to this area removed by the intake nurse. Son is present and helps provide the history. He reports that the left foot wound has healed. He continues to use collagen to the left trochanter wound. He denies any signs of infection or odor to the wounds. 02/12/2022 Patient was last seen 6 months ago. Son is present. Since then all her wounds have healed except for her left hip wound. They have been using collagen with dressing changes. The patient has had no issues and has no questions or concerns today. She denies signs of infection. 7/6; patient presents for follow-up. She has been using Keystone antibiotics based on a PCR culture done at last clinic visit. She has no issues or complaints today. Electronic Signature(s) Signed: 04/19/2022 2:35:21 PM By: Kalman Shan DO Entered By: Kalman Shan on 04/19/2022 13:36:54 -------------------------------------------------------------------------------- Physical Exam Details Patient Name: Date of Service: [name] 04/19/2022 12:30 PM Medical Record Number: [id number] Patient Account Number: [id number] Date of Birth/Sex: Treating RN: 1935-05-26 (87 y.o. [name] Primary Care Provider: Clent Demark Other Clinician: Referring Provider: Treating Provider/Extender: Edrick Kins Weeks in Treatment: 9 Constitutional respirations regular, non-labored and within target range for patient.Marland Kitchen Psychiatric pleasant and cooperative. Notes Left hip: Open wound with granulation tissue at the opening. There is tunneling at the 5 o'clock position. Undermining circumferentially. No signs of  surrounding infection. Electronic Signature(s) Signed: 04/19/2022 2:35:21 PM By: Kalman Shan DO Entered By: Kalman Shan on 04/19/2022 13:37:34 -------------------------------------------------------------------------------- Physician Orders Details Patient Name: Date of Service: [name], [name] 04/19/2022 12:30 PM Medical Record Number: [id number] Patient Account Number: [id number] Date of Birth/Sex: Treating RN: 1934/12/26 (87 y.o. [name] Primary Care Provider: Clent Demark Other Clinician: Referring Provider: Treating Provider/Extender: Collene Schlichter in Treatment: 9 Verbal / Phone Orders: No Diagnosis Coding ICD-10 Coding Code Description [phone] Pressure ulcer of left hip, stage 3 G30.9 Alzheimer's disease, unspecified Follow-up Appointments ppointment in: - 07/05/22 @ 12:30pm with Dr. Heber Bennett [facility], Room 7) Return A Bathing/ Shower/ Hygiene May shower and wash wound with soap and water. - when changing dressing Off-Loading Gel mattress overlay (Group 1) Roho cushion for wheelchair Turn and reposition every 2 hours - Reduce pressure to left hip Home Health No change in wound care orders this week; continue [facility] for wound care. May utilize formulary equivalent dressing for wound treatment orders unless otherwise specified. - Continue packing wound with gauze moistened with Keystone Antibiotic. Other Home Health Orders/Instructions: [facility]: 1x week for skilled nursing, wound care Wound Treatment Wound #8 - Trochanter Wound Laterality: Left Cleanser: Soap and Water [facility]) Every Other Day/30 Days Discharge Instructions: May shower and wash wound with dial antibacterial soap and water prior to dressing change. Cleanser: Wound Cleanser [facility]) Every Other Day/30 Days Discharge Instructions: Cleanse the wound with wound cleanser prior to applying a clean dressing using gauze sponges, not tissue or cotton  balls. Peri-Wound Care: Zinc Oxide Ointment 30g tube [facility]) Every Other Day/30 Days Discharge Instructions: Apply  Zinc Oxide to periwound with each dressing change Topical: Keystone Antibiotic Compound Every Other Day/30 Days Discharge Instructions: Moisten gauze with solution Secondary Dressing: Woven Gauze Sponge, Non-Sterile 4x4 in Every Other Day/30 Days Discharge Instructions: Moisten with Keystone and pack into wound Secondary Dressing: Zetuvit Plus Silicone Border Dressing 4x4 (in/in) ([place]) Every Other Day/30 Days Discharge Instructions: Apply silicone border over primary dressing as directed. Electronic Signature(s) Signed: 04/19/2022 2:35:21 PM By: Kalman Shan DO Entered By: Kalman Shan on 04/19/2022 13:37:45 -------------------------------------------------------------------------------- Problem List Details Patient Name: Date of Service: [name], [name] 04/19/2022 12:30 PM Medical Record Number: [id number] Patient Account Number: [id number] Date of Birth/Sex: Treating RN: [birth year] (87 y.o. [name] Primary Care Provider: Clent Demark Other Clinician: Referring Provider: Treating Provider/Extender: Edrick Kins Weeks in Treatment: 9 Active Problems ICD-10 Encounter Code Description Active Date MDM Diagnosis [phone] Pressure ulcer of left hip, stage 3 02/12/2022 No Yes G30.9 Alzheimer's disease, unspecified 02/12/2022 No Yes Inactive Problems Resolved Problems Electronic Signature(s) Signed: 04/19/2022 2:35:21 PM By: Kalman Shan DO Entered By: Kalman Shan on 04/19/2022 13:36:18 -------------------------------------------------------------------------------- Progress Note Details Patient Name: Date of Service: [name], [name] 04/19/2022 12:30 PM Medical Record Number: [id number] Patient Account Number: [id number] Date of Birth/Sex: Treating RN: [birth year] (87 y.o. [name] Primary Care  Provider: Clent Demark Other Clinician: Referring Provider: Treating Provider/Extender: Edrick Kins Weeks in Treatment: 9 Subjective Chief Complaint Information obtained from Patient 5/1; left hip wound History of Present Illness (HPI) Admission 5/9 Ms. Ballengee is an 87 year old female with a past medical history of hypertension, hyperlipidemia, CAD s/p MI, and Alzheimer's Disease that presents to the clinic today for A left hip wound. The son is present and helps provide the history. This was noticed about 2 weeks ago and there was a scab to the area. When this was removed there was depth to the wound. It has gotten slightly larger over the past 3 days. Patient denies any fever/chills, nausea/vomiting and states she has been her usual state of health. Per son she has ample at home care . She has an aide that comes in 5 days a week. She is able to walk with support for short distances. This is limited due to her advanced dementia. She usually stays in a chair for most of the day and then transfers to a bed at night. 5/19; patient presents for 1 week follow-up. Son was present and helped provide the history. She has been taking Augmentin without any issues. They have been applying Santyl daily to the wound. No complaints today. 6/2; patient presents for 1 week follow-up. Son is present and helps provide the history. She has been using Dakin's wet-to-dry. She states she feels well and denies any signs of infection. 6/9; patient presents for 1 week follow-up. Son is present and helps provide the history. He continues to do Dakin's wet-to-dry. Overall patient feels well and denies signs of infection. She is using her airflow mattress for about 12 hours a day. 6/23; patient presents for 2-week follow-up. Son is present. He has been using Dakin's wet-to-dry and has noticed improvement in the wound. There are no issues or complaints today. She denies signs of  infection. 7/8; patient presents for 2-week follow-up. Son is present. She has been using silver alginate every other day to the wound bed. Unfortunately she has had some excoriation to the surrounding skin. She also developed 2 skin tears to her  left leg and is not quite sure how this happened. She denies signs of infection. 7/21; patient presents for 2-week follow-up. Son is present. She has been using Dakin wet-to-dry dressings daily. There has been improvement in the odor and drainage. No obvious signs of infection. 7/28; patient presents for 1 week follow-up. There has been a significant decrease in odor and drainage since last week with the continued use of Dakin's wet- to-dry dressings. Patient and son would like to try a new dressing to see if this will help stimulate healing. 8/25; patient presents for follow-up. There are no issues or complaints today. Patient denies signs of infection. 9/19; patient presents for follow-up. Son reports improvement in wound healing to the left trochanter wound with collagen. Patient's been using Vaseline to the left foot wound and Santyl to the right lateral ankle wound. There are no issues or complaints today. 11/14; patient presents for follow-up. Patient has had to reschedule her last 3 wound care appointments. She has been using Santyl to the right lateral foot wound. There is a scab t to this area removed by the intake nurse. Son is present and helps provide the history. He reports that the left foot wound has healed. He continues to use collagen to the left trochanter wound. He denies any signs of infection or odor to the wounds. 02/12/2022 Patient was last seen 6 months ago. Son is present. Since then all her wounds have healed except for her left hip wound. They have been using collagen with dressing changes. The patient has had no issues and has no questions or concerns today. She denies signs of infection. 7/6; patient presents for follow-up. She has  been using Keystone antibiotics based on a PCR culture done at last clinic visit. She has no issues or complaints today. Patient History Unable to Obtain Patient History due to Dementia. Information obtained from Patient. Family History Unknown History. Social History Never smoker, Marital Status - Married, Alcohol Use - Never, Drug Use - No History, Caffeine Use - Never. Medical History Eyes Denies history of Cataracts, Glaucoma, Optic Neuritis Ear/Nose/Mouth/Throat Denies history of Chronic sinus problems/congestion, Middle ear problems Hematologic/Lymphatic Denies history of Anemia, Hemophilia, Human Immunodeficiency Virus, Lymphedema, Sickle Cell Disease Respiratory Denies history of Aspiration, Asthma, Chronic Obstructive Pulmonary Disease (COPD), Pneumothorax, Sleep Apnea, Tuberculosis Cardiovascular Patient has history of Hypertension, Myocardial Infarction - 2019 Denies history of Angina, Arrhythmia, Congestive Heart Failure, Coronary Artery Disease, Deep Vein Thrombosis, Hypotension, Peripheral Arterial Disease, Peripheral Venous Disease, Phlebitis, Vasculitis Gastrointestinal Patient has history of Crohnoos Denies history of Cirrhosis , Colitis, Hepatitis A, Hepatitis B, Hepatitis C Endocrine Denies history of Type I Diabetes, Type II Diabetes Genitourinary Denies history of End Stage Renal Disease Immunological Denies history of Lupus Erythematosus, Raynaudoos, Scleroderma Integumentary (Skin) Denies history of History of Burn Musculoskeletal Denies history of Gout, Rheumatoid Arthritis, Osteoarthritis, Osteomyelitis Neurologic Patient has history of Dementia Denies history of Neuropathy, Quadriplegia, Paraplegia, Seizure Disorder Oncologic Denies history of Received Chemotherapy, Received Radiation Psychiatric Denies history of Anorexia/bulimia, Confinement Anxiety Hospitalization/Surgery History - left heart cath and coronary angiography. - cardiac cath. -  EGD. - colectomy. - dilation and curettage of uterus. - lap right hemicolectomy. Medical A Surgical History Notes nd Gastrointestinal incontienent Genitourinary incontinent; CKD stage 4 Neurologic hx TIA; alzheimer's Objective Constitutional respirations regular, non-labored and within target range for patient.. Vitals Time Taken: 12:47 PM, Temperature: 97.6 F, Pulse: 94 bpm, Respiratory Rate: 16 breaths/min, Blood Pressure: 122/74 mmHg. Psychiatric pleasant and cooperative. General Notes: Left  hip: Open wound with granulation tissue at the opening. There is tunneling at the 5 o'clock position. Undermining circumferentially. No signs of surrounding infection. Integumentary (Hair, Skin) Wound #8 status is Open. Original cause of wound was Gradually Appeared. The date acquired was: 02/05/2021. The wound has been in treatment 9 weeks. The wound is located on the Left Trochanter. The wound measures 0.7cm length x 0.8cm width x 0.8cm depth; 0.44cm^2 area and 0.352cm^3 volume. There is Fat Layer (Subcutaneous Tissue) exposed. There is no tunneling noted, however, there is undermining starting at 7:00 and ending at 10:00 with a maximum distance of 4cm. There is a medium amount of serosanguineous drainage noted. The wound margin is well defined and not attached to the wound base. There is large (67-100%) pink, pale granulation within the wound bed. There is a small (1-33%) amount of necrotic tissue within the wound bed. Assessment Active Problems ICD-10 Pressure ulcer of left hip, stage 3 Alzheimer's disease, unspecified Patient's wound has shown improvement in size in appearance since last clinic visit. She has been using Keystone antibiotic and I recommended continuing this. Continue aggressive offloading. Patient is a palliative wound care case and comes every 2 months. Due to her advanced dementia she would not be a surgical candidate or tolerate further aggressive care . This has been  discussed at length with the son over her visits and he is in agreement with this. We will continue with conservative wound care. Plan Follow-up Appointments: Return Appointment in: - 07/05/22 @ 12:30pm with Dr. Heber Kingston [facility], Room 7) Bathing/ Shower/ Hygiene: May shower and wash wound with soap and water. - when changing dressing Off-Loading: Gel mattress overlay (Group 1) Roho cushion for wheelchair Turn and reposition every 2 hours - Reduce pressure to left hip Home Health: No change in wound care orders this week; continue Home Health for wound care. May utilize formulary equivalent dressing for wound treatment orders unless otherwise specified. - Continue packing wound with gauze moistened with Keystone Antibiotic. Other Home Health Orders/Instructions: [facility]: 1x week for skilled nursing, wound care WOUND #8: - Trochanter Wound Laterality: Left Cleanser: Soap and Water [facility]) Every Other Day/30 Days Discharge Instructions: May shower and wash wound with dial antibacterial soap and water prior to dressing change. Cleanser: Wound Cleanser [facility]) Every Other Day/30 Days Discharge Instructions: Cleanse the wound with wound cleanser prior to applying a clean dressing using gauze sponges, not tissue or cotton balls. Peri-Wound Care: Zinc Oxide Ointment 30g tube [facility]) Every Other Day/30 Days Discharge Instructions: Apply Zinc Oxide to periwound with each dressing change Topical: Keystone Antibiotic Compound Every Other Day/30 Days Discharge Instructions: Moisten gauze with solution Secondary Dressing: Woven Gauze Sponge, Non-Sterile 4x4 in Every Other Day/30 Days Discharge Instructions: Moisten with Keystone and pack into wound Secondary Dressing: Zetuvit Plus Silicone Border Dressing 4x4 (in/in) ([place]) Every Other Day/30 Days Discharge Instructions: Apply silicone border over primary dressing as directed. 1. Continue Keystone antibiotic 2. Follow-up in 2  months Electronic Signature(s) Signed: 04/19/2022 2:35:21 PM By: Kalman Shan DO Entered By: Kalman Shan on 04/19/2022 13:40:42 -------------------------------------------------------------------------------- HxROS Details Patient Name: Date of Service: [name], [name] 04/19/2022 12:30 PM Medical Record Number: [id number] Patient Account Number: [id number] Date of Birth/Sex: Treating RN: 06/06/1935 (87 y.o. [name] Primary Care Provider: Clent Demark Other Clinician: Referring Provider: Treating Provider/Extender: Edrick Kins Weeks in Treatment: 9 Unable to Obtain Patient History due to Dementia Information Obtained From Patient Eyes  Medical History: Negative for: Cataracts; Glaucoma; Optic Neuritis Ear/Nose/Mouth/Throat Medical History: Negative for: Chronic sinus problems/congestion; Middle ear problems Hematologic/Lymphatic Medical History: Negative for: Anemia; Hemophilia; Human Immunodeficiency Virus; Lymphedema; Sickle Cell Disease Respiratory Medical History: Negative for: Aspiration; Asthma; Chronic Obstructive Pulmonary Disease (COPD); Pneumothorax; Sleep Apnea; Tuberculosis Cardiovascular Medical History: Positive for: Hypertension; Myocardial Infarction - 2019 Negative for: Angina; Arrhythmia; Congestive Heart Failure; Coronary Artery Disease; Deep Vein Thrombosis; Hypotension; Peripheral Arterial Disease; Peripheral Venous Disease; Phlebitis; Vasculitis Gastrointestinal Medical History: Positive for: Crohns Negative for: Cirrhosis ; Colitis; Hepatitis A; Hepatitis B; Hepatitis C Past Medical History Notes: incontienent Endocrine Medical History: Negative for: Type I Diabetes; Type II Diabetes Genitourinary Medical History: Negative for: End Stage Renal Disease Past Medical History Notes: incontinent; CKD stage 4 Immunological Medical History: Negative for: Lupus Erythematosus; Raynauds;  Scleroderma Integumentary (Skin) Medical History: Negative for: History of Burn Musculoskeletal Medical History: Negative for: Gout; Rheumatoid Arthritis; Osteoarthritis; Osteomyelitis Neurologic Medical History: Positive for: Dementia Negative for: Neuropathy; Quadriplegia; Paraplegia; Seizure Disorder Past Medical History Notes: hx TIA; alzheimer's Oncologic Medical History: Negative for: Received Chemotherapy; Received Radiation Psychiatric Medical History: Negative for: Anorexia/bulimia; Confinement Anxiety Immunizations Pneumococcal Vaccine: Received Pneumococcal Vaccination: No Implantable Devices None Hospitalization / Surgery History Type of Hospitalization/Surgery left heart cath and coronary angiography cardiac cath EGD colectomy dilation and curettage of uterus lap right hemicolectomy Family and Social History Unknown History: Yes; Never smoker; Marital Status - Married; Alcohol Use: Never; Drug Use: No History; Caffeine Use: Never; Financial Concerns: No; Food, Clothing or Shelter Needs: No; Support System Lacking: No; Transportation Concerns: No Electronic Signature(s) Signed: 04/19/2022 2:35:21 PM By: Kalman Shan DO Signed: 04/19/2022 4:14:47 PM By: Lorrin Jackson Entered By: Kalman Shan on 04/19/2022 13:37:00 -------------------------------------------------------------------------------- SuperBill Details Patient Name: Date of Service: [name], [name] 04/19/2022 Medical Record Number: [id number] Patient Account Number: [id number] Date of Birth/Sex: Treating RN: 07/01/1935 (87 y.o. [name] Primary Care Provider: Clent Demark Other Clinician: Referring Provider: Treating Provider/Extender: Edrick Kins Weeks in Treatment: 9 Diagnosis Coding ICD-10 Codes Code Description [phone] Pressure ulcer of left hip, stage 3 G30.9 Alzheimer's disease, unspecified Facility Procedures CPT4 Code: [id number]  9 Description: [place] VISIT-LEV 3 EST PT Modifier: Quantity: 1 Physician Procedures : CPT4 Code Description Modifier [id number] - WC PHYS LEVEL 3 - EST PT ICD-10 Diagnosis Description L89.223 Pressure ulcer of left hip, stage 3 G30.9 Alzheimer's disease, unspecified Quantity: 1 Electronic Signature(s) Signed: 04/19/2022 2:35:21 PM By: Kalman Shan DO Entered By: Kalman Shan on 04/19/2022 13:40:52

## 2022-05-06 ENCOUNTER — Emergency Department (HOSPITAL_COMMUNITY): Payer: Medicare Other

## 2022-05-06 ENCOUNTER — Inpatient Hospital Stay (HOSPITAL_COMMUNITY)
Admission: EM | Admit: 2022-05-06 | Discharge: 2022-05-15 | DRG: 082 | Disposition: E | Payer: Medicare Other | Attending: Internal Medicine | Admitting: Internal Medicine

## 2022-05-06 ENCOUNTER — Other Ambulatory Visit: Payer: Self-pay

## 2022-05-06 ENCOUNTER — Encounter (HOSPITAL_COMMUNITY): Payer: Self-pay | Admitting: *Deleted

## 2022-05-06 DIAGNOSIS — E43 Unspecified severe protein-calorie malnutrition: Secondary | ICD-10-CM | POA: Diagnosis present

## 2022-05-06 DIAGNOSIS — L89222 Pressure ulcer of left hip, stage 2: Secondary | ICD-10-CM | POA: Diagnosis present

## 2022-05-06 DIAGNOSIS — Z7902 Long term (current) use of antithrombotics/antiplatelets: Secondary | ICD-10-CM

## 2022-05-06 DIAGNOSIS — R569 Unspecified convulsions: Secondary | ICD-10-CM | POA: Diagnosis present

## 2022-05-06 DIAGNOSIS — E876 Hypokalemia: Secondary | ICD-10-CM | POA: Diagnosis present

## 2022-05-06 DIAGNOSIS — I252 Old myocardial infarction: Secondary | ICD-10-CM

## 2022-05-06 DIAGNOSIS — S066XAA Traumatic subarachnoid hemorrhage with loss of consciousness status unknown, initial encounter: Secondary | ICD-10-CM | POA: Diagnosis not present

## 2022-05-06 DIAGNOSIS — W19XXXA Unspecified fall, initial encounter: Secondary | ICD-10-CM | POA: Diagnosis present

## 2022-05-06 DIAGNOSIS — I129 Hypertensive chronic kidney disease with stage 1 through stage 4 chronic kidney disease, or unspecified chronic kidney disease: Secondary | ICD-10-CM | POA: Diagnosis present

## 2022-05-06 DIAGNOSIS — D638 Anemia in other chronic diseases classified elsewhere: Secondary | ICD-10-CM

## 2022-05-06 DIAGNOSIS — R64 Cachexia: Secondary | ICD-10-CM | POA: Diagnosis present

## 2022-05-06 DIAGNOSIS — Z87442 Personal history of urinary calculi: Secondary | ICD-10-CM

## 2022-05-06 DIAGNOSIS — Z79899 Other long term (current) drug therapy: Secondary | ICD-10-CM

## 2022-05-06 DIAGNOSIS — K5090 Crohn's disease, unspecified, without complications: Secondary | ICD-10-CM | POA: Diagnosis present

## 2022-05-06 DIAGNOSIS — Z8673 Personal history of transient ischemic attack (TIA), and cerebral infarction without residual deficits: Secondary | ICD-10-CM

## 2022-05-06 DIAGNOSIS — R1310 Dysphagia, unspecified: Secondary | ICD-10-CM

## 2022-05-06 DIAGNOSIS — Z681 Body mass index (BMI) 19 or less, adult: Secondary | ICD-10-CM

## 2022-05-06 DIAGNOSIS — Z885 Allergy status to narcotic agent status: Secondary | ICD-10-CM

## 2022-05-06 DIAGNOSIS — I609 Nontraumatic subarachnoid hemorrhage, unspecified: Secondary | ICD-10-CM

## 2022-05-06 DIAGNOSIS — D631 Anemia in chronic kidney disease: Secondary | ICD-10-CM | POA: Diagnosis present

## 2022-05-06 DIAGNOSIS — R001 Bradycardia, unspecified: Secondary | ICD-10-CM | POA: Diagnosis not present

## 2022-05-06 DIAGNOSIS — Z882 Allergy status to sulfonamides status: Secondary | ICD-10-CM

## 2022-05-06 DIAGNOSIS — Y92009 Unspecified place in unspecified non-institutional (private) residence as the place of occurrence of the external cause: Secondary | ICD-10-CM

## 2022-05-06 DIAGNOSIS — S0003XA Contusion of scalp, initial encounter: Secondary | ICD-10-CM | POA: Diagnosis present

## 2022-05-06 DIAGNOSIS — R7989 Other specified abnormal findings of blood chemistry: Secondary | ICD-10-CM

## 2022-05-06 DIAGNOSIS — I629 Nontraumatic intracranial hemorrhage, unspecified: Principal | ICD-10-CM

## 2022-05-06 DIAGNOSIS — N1832 Chronic kidney disease, stage 3b: Secondary | ICD-10-CM | POA: Diagnosis present

## 2022-05-06 DIAGNOSIS — B179 Acute viral hepatitis, unspecified: Secondary | ICD-10-CM | POA: Diagnosis present

## 2022-05-06 DIAGNOSIS — D61818 Other pancytopenia: Secondary | ICD-10-CM | POA: Diagnosis present

## 2022-05-06 DIAGNOSIS — F0280 Dementia in other diseases classified elsewhere without behavioral disturbance: Secondary | ICD-10-CM | POA: Diagnosis present

## 2022-05-06 DIAGNOSIS — N184 Chronic kidney disease, stage 4 (severe): Secondary | ICD-10-CM | POA: Diagnosis present

## 2022-05-06 DIAGNOSIS — R4182 Altered mental status, unspecified: Secondary | ICD-10-CM | POA: Diagnosis not present

## 2022-05-06 DIAGNOSIS — E46 Unspecified protein-calorie malnutrition: Secondary | ICD-10-CM

## 2022-05-06 DIAGNOSIS — Z66 Do not resuscitate: Secondary | ICD-10-CM | POA: Diagnosis not present

## 2022-05-06 DIAGNOSIS — I2510 Atherosclerotic heart disease of native coronary artery without angina pectoris: Secondary | ICD-10-CM | POA: Diagnosis present

## 2022-05-06 DIAGNOSIS — D696 Thrombocytopenia, unspecified: Secondary | ICD-10-CM

## 2022-05-06 DIAGNOSIS — Z91010 Allergy to peanuts: Secondary | ICD-10-CM

## 2022-05-06 DIAGNOSIS — G309 Alzheimer's disease, unspecified: Secondary | ICD-10-CM | POA: Diagnosis present

## 2022-05-06 DIAGNOSIS — N179 Acute kidney failure, unspecified: Secondary | ICD-10-CM | POA: Diagnosis not present

## 2022-05-06 DIAGNOSIS — Z9049 Acquired absence of other specified parts of digestive tract: Secondary | ICD-10-CM

## 2022-05-06 DIAGNOSIS — L89229 Pressure ulcer of left hip, unspecified stage: Secondary | ICD-10-CM | POA: Diagnosis present

## 2022-05-06 DIAGNOSIS — R54 Age-related physical debility: Secondary | ICD-10-CM | POA: Diagnosis present

## 2022-05-06 DIAGNOSIS — G9349 Other encephalopathy: Secondary | ICD-10-CM | POA: Diagnosis present

## 2022-05-06 LAB — COMPREHENSIVE METABOLIC PANEL
ALT: 144 U/L — ABNORMAL HIGH (ref 0–44)
AST: 122 U/L — ABNORMAL HIGH (ref 15–41)
Albumin: 3.4 g/dL — ABNORMAL LOW (ref 3.5–5.0)
Alkaline Phosphatase: 106 U/L (ref 38–126)
Anion gap: 10 (ref 5–15)
BUN: 66 mg/dL — ABNORMAL HIGH (ref 8–23)
CO2: 26 mmol/L (ref 22–32)
Calcium: 9.1 mg/dL (ref 8.9–10.3)
Chloride: 102 mmol/L (ref 98–111)
Creatinine, Ser: 2.16 mg/dL — ABNORMAL HIGH (ref 0.44–1.00)
GFR, Estimated: 22 mL/min — ABNORMAL LOW (ref >60)
Glucose, Bld: 96 mg/dL (ref 70–99)
Potassium: 4.2 mmol/L (ref 3.5–5.1)
Sodium: 138 mmol/L (ref 135–145)
Total Bilirubin: 0.7 mg/dL (ref 0.3–1.2)
Total Protein: 6.2 g/dL — ABNORMAL LOW (ref 6.5–8.1)

## 2022-05-06 LAB — CBC WITH DIFFERENTIAL/PLATELET
Abs Immature Granulocytes: 0.01 10*3/uL (ref 0.00–0.07)
Basophils Absolute: 0 10*3/uL (ref 0.0–0.1)
Basophils Relative: 0 %
Eosinophils Absolute: 0 10*3/uL (ref 0.0–0.5)
Eosinophils Relative: 1 %
HCT: 29.8 % — ABNORMAL LOW (ref 36.0–46.0)
Hemoglobin: 9.7 g/dL — ABNORMAL LOW (ref 12.0–15.0)
Immature Granulocytes: 0 %
Lymphocytes Relative: 16 %
Lymphs Abs: 0.8 10*3/uL (ref 0.7–4.0)
MCH: 32.2 pg (ref 26.0–34.0)
MCHC: 32.6 g/dL (ref 30.0–36.0)
MCV: 99 fL (ref 80.0–100.0)
Monocytes Absolute: 0.3 10*3/uL (ref 0.1–1.0)
Monocytes Relative: 5 %
Neutro Abs: 3.8 10*3/uL (ref 1.7–7.7)
Neutrophils Relative %: 78 %
Platelets: 109 10*3/uL — ABNORMAL LOW (ref 150–400)
RBC: 3.01 MIL/uL — ABNORMAL LOW (ref 3.87–5.11)
RDW: 14.2 % (ref 11.5–15.5)
WBC: 4.9 10*3/uL (ref 4.0–10.5)
nRBC: 0 % (ref 0.0–0.2)

## 2022-05-06 LAB — URINALYSIS, ROUTINE W REFLEX MICROSCOPIC
Bilirubin Urine: NEGATIVE
Glucose, UA: NEGATIVE mg/dL
Hgb urine dipstick: NEGATIVE
Ketones, ur: NEGATIVE mg/dL
Leukocytes,Ua: NEGATIVE
Nitrite: NEGATIVE
Protein, ur: NEGATIVE mg/dL
Specific Gravity, Urine: 1.008 (ref 1.005–1.030)
pH: 6 (ref 5.0–8.0)

## 2022-05-06 MED ORDER — LEVETIRACETAM IN NACL 500 MG/100ML IV SOLN
500.0000 mg | Freq: Once | INTRAVENOUS | Status: AC
  Administered 2022-05-07: 500 mg via INTRAVENOUS
  Filled 2022-05-06: qty 100

## 2022-05-06 MED ORDER — SODIUM CHLORIDE 0.9 % IV BOLUS
1000.0000 mL | Freq: Once | INTRAVENOUS | Status: AC
  Administered 2022-05-07: 1000 mL via INTRAVENOUS

## 2022-05-06 MED ORDER — SODIUM CHLORIDE 0.9 % IV BOLUS
500.0000 mL | Freq: Once | INTRAVENOUS | Status: AC
  Administered 2022-05-06: 500 mL via INTRAVENOUS

## 2022-05-06 NOTE — ED Provider Notes (Signed)
[facility] EMERGENCY DEPARTMENT Provider Note   CSN: [id number] Arrival date & time: 04/23/2022  2056     History  Chief Complaint  Patient presents with   Transient Ischemic Attack    JIMMI SIDENER is a 87 y.o. female hx of TIA here presenting with altered mental status.  Patient was with son and around 7 PM, she slumped over and fell to the right side.  She seems to be stiff on the right side.  She also is drooling on the right side. Son was with her until 6:45 PM when he just briefly stepped to the laundry room so her last normal was 6:45 PM.  Patient is demented at baseline.  EMS was called and by the time she got here, patient is sleepy but back to baseline.  Neurology evaluated patient with me on arrival.  No history of seizures.  The history is provided by the patient.       Home Medications Prior to Admission medications   Medication Sig Start Date End Date Taking? Authorizing Provider  ARIPiprazole (ABILIFY) 5 MG tablet Take 1 tablet (5 mg total) by mouth daily. 12/08/21   Cameron Sprang, MD  atorvastatin (LIPITOR) 40 MG tablet Take 1 tablet (40 mg total) by mouth daily at 6 PM. Patient taking differently: Take 20 mg by mouth daily at 6 PM. 11/30/17   Charolette Forward, MD  carvedilol (COREG) 6.25 MG tablet Take 6.25 mg by mouth 2 (two) times daily with a meal.    [provider]  clopidogrel (PLAVIX) 75 MG tablet Take 1 tablet (75 mg total) by mouth daily. 12/01/17   Charolette Forward, MD  furosemide (LASIX) 20 MG tablet Take 40 mg by mouth daily. 05/26/18   [provider]  hydroxypropyl methylcellulose (ISOPTO TEARS) 2.5 % ophthalmic solution Place 1 drop into both eyes 4 (four) times daily as needed for dry eyes.    [provider]  Multiple Vitamins-Minerals (MULTIVITAMIN ADULTS) TABS Take 0.5 tablets by mouth daily.    [provider]  nitroGLYCERIN (NITROSTAT) 0.4 MG SL tablet Place 1 tablet (0.4 mg total) under the tongue  every 5 (five) minutes x 3 doses as needed for chest pain. 11/30/17   Charolette Forward, MD  omeprazole (PRILOSEC) 20 MG capsule Take 20 mg by mouth daily.     [provider]  potassium chloride (K-DUR) 10 MEQ tablet Take 10 mEq by mouth daily. 05/26/18   [provider]  QUEtiapine (SEROQUEL) 25 MG tablet Take 0.5 tablets (12.5 mg total) by mouth at bedtime. 12/08/21   Cameron Sprang, MD      Allergies    Sulfa antibiotics, Codeine, and Peanut-containing drug products    Review of Systems   Review of Systems  Neurological:  Positive for seizures and speech difficulty.  All other systems reviewed and are negative.   Physical Exam Updated Vital Signs BP 127/78 (BP Location: Left Arm)   Pulse 67   Temp (!) 97.2 F (36.2 C) (Oral)   Resp 18   SpO2 99%  Physical Exam Vitals and nursing note reviewed.  Constitutional:      Comments: Demented and contracted on the right side.  No obvious seizure-like activity  HENT:     Head:     Comments: Contusion in the left scalp area    Nose: Nose normal.     Mouth/Throat:     Mouth: Mucous membranes are moist.  Eyes:     Extraocular  Movements: Extraocular movements intact.     Pupils: Pupils are equal, round, and reactive to light.  Cardiovascular:     Rate and Rhythm: Normal rate and regular rhythm.     Pulses: Normal pulses.     Heart sounds: Normal heart sounds.  Pulmonary:     Effort: Pulmonary effort is normal.     Breath sounds: Normal breath sounds.  Abdominal:     General: Abdomen is flat.     Palpations: Abdomen is soft.  Musculoskeletal:        General: Normal range of motion.     Cervical back: Normal range of motion and neck supple.     Comments: Patient has stage I right buttock decub and stage II left hip decub  Skin:    General: Skin is warm.     Capillary Refill: Capillary refill takes less than 2 seconds.  Neurological:     Comments: Patient has increased tone in the right arm and leg.  Patient has  4 out of 5 strength in the right arm and leg and 5 out of 5 on the left arm and leg. Patient has no obvious eye deviation.  No seizure-like activity  Psychiatric:     Comments: unable     ED Results / Procedures / Treatments   Labs (all labs ordered are listed, but only abnormal results are displayed) Labs Reviewed  CBC WITH DIFFERENTIAL/PLATELET - Abnormal; Notable for the following components:      Result Value   RBC 3.01 (*)    Hemoglobin 9.7 (*)    HCT 29.8 (*)    Platelets 109 (*)    All other components within normal limits  COMPREHENSIVE METABOLIC PANEL - Abnormal; Notable for the following components:   BUN 66 (*)    Creatinine, Ser 2.16 (*)    Total Protein 6.2 (*)    Albumin 3.4 (*)    AST 122 (*)    ALT 144 (*)    GFR, Estimated 22 (*)    All other components within normal limits  URINALYSIS, ROUTINE W REFLEX MICROSCOPIC - Abnormal; Notable for the following components:   Color, Urine STRAW (*)    All other components within normal limits  URINE CULTURE    EKG EKG Interpretation  Date/Time:  Sunday May 06 2022 21:39:33 EDT Ventricular Rate:  107 PR Interval:  170 QRS Duration: 141 QT Interval:  387 QTC Calculation: 444 R Axis:   18 Text Interpretation: Sinus tachycardia Multiform ventricular premature complexes Nonspecific intraventricular conduction delay Repol abnrm suggests ischemia, lateral leads No significant change since last tracing Confirmed by Wandra Arthurs ([id number]) on 05/13/2022 9:59:25 PM  Radiology DG Chest Port 1 View  Result Date: 05/08/2022 CLINICAL DATA:  Altered mental status. EXAM: PORTABLE CHEST 1 VIEW COMPARISON:  July 19, 2019 FINDINGS: The heart size and mediastinal contours are within normal limits. Both lungs are clear. The visualized skeletal structures are unremarkable. IMPRESSION: No active disease. Electronically Signed   By: Virgina Norfolk M.D.   On: 05/02/2022 22:18    Procedures Procedures    CRITICAL CARE Performed  by: Wandra Arthurs   Total critical care time: 30 minutes  Critical care time was exclusive of separately billable procedures and treating other patients.  Critical care was necessary to treat or prevent imminent or life-threatening deterioration.  Critical care was time spent personally by me on the following activities: development of treatment plan with patient and/or surrogate as well as nursing, discussions with  consultants, evaluation of patient's response to treatment, examination of patient, obtaining history from patient or surrogate, ordering and performing treatments and interventions, ordering and review of laboratory studies, ordering and review of radiographic studies, pulse oximetry and re-evaluation of patient's condition.   Medications Ordered in ED Medications  sodium chloride 0.9 % bolus 1,000 mL (has no administration in time range)  levETIRAcetam (KEPPRA) IVPB 500 mg/100 mL premix (has no administration in time range)  sodium chloride 0.9 % bolus 500 mL (500 mLs Intravenous New Bag/Given 04/28/2022 2226)    ED Course/ Medical Decision Making/ A&P                           Medical Decision Making FARIA CASELLA is a 87 y.o. female here with altered mental status and possible seizure.  Patient is confused at baseline and appears to be back to baseline on arrival.  The son came shortly after patient arrived to the ED.  I discussed case with Dr. Regis Bill from neurology on arrival and he states that patient does not meet criteria for code stroke.  He recommend toxic metabolic work-up including CT head.  11:39 PM CT head showed small right frontal subarachnoid bleed.  I discussed with Dr. Regis Bill again.  He recommend loading with Keppra 500 mg IV and start Keppra 250 twice daily.  He recommend repeat CT head in 6 hours and he will follow along and ICU to admit.   Problems Addressed: Intracranial hemorrhage ([place]): acute illness or injury  Amount and/or Complexity of Data  Reviewed Labs: ordered. Decision-making details documented in ED Course. Radiology: ordered and independent interpretation performed. Decision-making details documented in ED Course. ECG/medicine tests: ordered and independent interpretation performed. Decision-making details documented in ED Course.  Risk Prescription drug management. Decision regarding hospitalization.  Final Clinical Impression(s) / ED Diagnoses Final diagnoses:  None    Rx / DC Orders ED Discharge Orders     None         Drenda Freeze, MD 04/14/2022 2341

## 2022-05-06 NOTE — Consult Note (Addendum)
NEUROLOGY CONSULTATION NOTE   Date of service: May 06, 2022 Patient Name: KAYLIAH TINDOL MRN:  [id number] DOB:  07-21-1935 Reason for consult: "Episode of unresponsiveness and leaning on right" Requesting Provider: Drenda Freeze, MD _ _ _   _ __   _ __ _ _  __ __   _ __   __ _  History of Present Illness  Hermelinda Darnell Level Duggin is a 87 y.o. female with PMH significant for alzheimers, TIA, Crohns, nephrolithiasis, HTN who was at home eating dinner with her son when he left around 1845 and returned to fine her unresponsive, starring off and R side of face was drawn in and her R cheek was tense. She leans on her right at baseline and prefers to use her LUE. He thinks this is partially due to pain and then also from a pressure sore on the backside on her left. He called EMS and she seems to be starting to come back but was drowsy.  No prior hx of seizures, no family hx of seizures. Does have alzheimers and follows with Dr. Delice Lesch.  Most of the history is provided by son, as patient is pleasantly confused.    ROS   Unable to obtain ROS 2/2 dementia.  Past History   Past Medical History:  Diagnosis Date   Acute lateral wall myocardial infarction ([place]) 11/28/2017   Alzheimer disease ([place])    Crohn disease ([place])    History of kidney stones    Hypertension    TIA (transient ischemic attack) 01/2014   hx/notes 02/06/2014   Past Surgical History:  Procedure Laterality Date   CARDIAC CATHETERIZATION  11/28/2017   COLECTOMY     DILATION AND CURETTAGE OF UTERUS     ESOPHAGOGASTRODUODENOSCOPY  10/2008   Archie Endo 02/14/2011   LAPAROSCOPIC RIGHT HEMI COLECTOMY  1970s   /notes 5/2/012   LEFT HEART CATH AND CORONARY ANGIOGRAPHY N/A 11/28/2017   Procedure: LEFT HEART CATH AND CORONARY ANGIOGRAPHY;  Surgeon: Charolette Forward, MD;  Location: [place] [facility];  Service: Cardiovascular;  Laterality: N/A;   TONSILLECTOMY     No family history on file. Social History   Socioeconomic History    Marital status: Married    Spouse name: Not on file   Number of children: 2   Years of education: Not on file   Highest education level: Master's degree (e.g., MA, MS, MEng, MEd, MSW, MBA)  Occupational History   Not on file  Tobacco Use   Smoking status: Never   Smokeless tobacco: Never  Vaping Use   Vaping Use: Never used  Substance and Sexual Activity   Alcohol use: No   Drug use: No   Sexual activity: Never  Other Topics Concern   Not on file  Social History Narrative   Pt lives in 1 story home with her husband   Has 2 adult sons   Masters degree in education   Retired Tourist information centre manager    Right handed    Social Determinants of Health   Financial Resource Strain: Not on file  Food Insecurity: Not on file  Transportation Needs: Not on file  Physical Activity: Not on file  Stress: Not on file  Social Connections: Not on file   Allergies  Allergen Reactions   Sulfa Antibiotics Anaphylaxis and Rash   Codeine Nausea And Vomiting   Peanut-Containing Drug Products     Medications  (Not in a hospital admission)    Vitals   Vitals:   04/25/2022 2101  BP: 127/78  Pulse: 67  Resp: 18  Temp: (!) 97.2 F (36.2 C)  TempSrc: Oral  SpO2: 99%     There is no height or weight on file to calculate BMI.  Physical Exam   General: Laying comfortably in bed; appears frail and cachectic, in no acute distress.  HENT: Normal oropharynx and mucosa. Normal external appearance of ears and nose.  Neck: Supple, no pain or tenderness  CV: No JVD. No peripheral edema.  Pulmonary: Symmetric Chest rise. Normal respiratory effort.  Abdomen: Soft to touch, non-tender.  Ext: No cyanosis, edema, or deformity  Skin: No rash. Normal palpation of skin.   Musculoskeletal: Normal digits and nails by inspection. No clubbing.   Neurologic Examination  Mental status/Cognition: somnolent, doses off, oriented to self and knows her birthday but does not know place, month or year. Poor  attention. Speech/language: Fluent, comprehension intact to simple commands,names a thumb. Cranial nerves:   CN II Pupils equal and reactive to light, no VF deficits    CN III,IV,VI EOM intact, no gaze preference or deviation, no nystagmus    CN V normal sensation in V1, V2, and V3 segments bilaterally    CN VII no asymmetry, no nasolabial fold flattening    CN VIII normal hearing to speech    CN IX & X normal palatal elevation, no uvular deviation    CN XI 5/5 head turn and 5/5 shoulder shrug bilaterally    CN XII midline tongue protrusion    Motor:  Muscle bulk: poor, tone increased with some cogwheeling. Mvmt Root Nerve  Muscle Right Left Comments  SA C5/6 Ax Deltoid     EF C5/6 Mc Biceps     EE C6/7/8 Rad Triceps     WF C6/7 Med FCR     WE C7/8 PIN ECU     F Ab C8/T1 U ADM/FDI 5 5   HF L1/2/3 Fem Illopsoas     KE L2/3/4 Fem Quad     DF L4/5 D Peron Tib Ant 2 2   PF S1/2 Tibial Grc/Sol 2 2    Sensation:  Light touch Intact throughout   Pin prick    Temperature    Vibration   Proprioception    Coordination/Complex Motor:  - unable to assess 2/2 somnolence. - Gait: Deferred for patient safety.  Labs   CBC: No results for input(s): "WBC", "NEUTROABS", "HGB", "HCT", "MCV", "PLT" in the last 168 hours.  Basic Metabolic Panel:  Lab Results  Component Value Date   NA 140 07/19/2019   K 3.5 07/19/2019   CO2 23 07/19/2019   GLUCOSE 93 07/19/2019   BUN 21 07/19/2019   CREATININE 1.29 (H) 07/19/2019   CALCIUM 9.2 07/19/2019   GFRNONAA 39 (L) 07/19/2019   GFRAA 45 (L) 07/19/2019   Lipid Panel:  Lab Results  Component Value Date   LDLCALC 79 11/29/2017   HgbA1c:  Lab Results  Component Value Date   HGBA1C 5.7 (H) 11/28/2017   Urine Drug Screen:     Component Value Date/Time   LABOPIA NONE DETECTED 12/04/2017 2319   COCAINSCRNUR NONE DETECTED 12/04/2017 2319   LABBENZ NONE DETECTED 12/04/2017 2319   AMPHETMU NONE DETECTED 12/04/2017 2319   THCU NONE  DETECTED 12/04/2017 2319   LABBARB NONE DETECTED 12/04/2017 2319    Alcohol Level     Component Value Date/Time   ETH <10 12/04/2017 2135    CT Head without contrast: Pending  rEEG:  pending  Impression  TERIYAH PURINGTON is a 87 y.o. female with PMH significant for alzheimers, TIA, Crohns, nephrolithiasis, HTN who presents with episode of starring off, R cheek tense and then somnolence. Her neurologic examination is notable for frail cachectic female leaning on her right, somnolent and encephalopathic(likley post ictal encephalopathy due to seizure) and thus poor participation with exam but no focal deficit.  She does have a remote L parieto-occipital infarct which could be the focus for seizure. No prior hx of seizures. Recommend repeat CTH to evaluate for any new infarcts or ICH or other abnormalities.  Recommendations  - Recommend CT Head without contrast. - recommend routine outpatient EEG. - Recommend Keppra 220m BID. This might be a low dose but given that she is so frail and cachectic and weighs about 40kgs, typical starting dose of 5020mBID might be high for her. - [facility]   Update: Personally reviewed CT Head without contrast which demonstrates a small focus of SAH underlying the right frontal operculum, likely traumatic. Along with left posterior scalp hematoma without skull fracture.  Recs: - Keppra 50069mV once, followed by Keppra 250m79mD - Repeat CTH in 6 hours to ensure tability. - She is not on anticoagulation. - Hold off home Plavix, avoid NSAIDS. - MRI Brain with and without contrast to evaluate for any underlying vascular abnormality. - Keep SBP < 150 and platelets above 100k. - PT/INR ordered and pending. - routine EEG ______________________________________________________________________   Thank you for the opportunity to take part in the care of this patient. If you have any further questions,  please contact the neurology consultation attending.  Signed,  SalmEs[place]er Number [phone] _   _ __   _ __ _ _  __ __   _ __   __ _

## 2022-05-06 NOTE — ED Triage Notes (Signed)
Pt arrived with [facility]  from home. Pt ate dinner around 1900, per report pt usually gets sleepy after dinner. Son went to help the pt and she was staring at him, leaning to the R, and drooling, not answering any questions. Hx of TIA, son concerned for same. On arrival, pt answering questions with inappropriate answers. Pressure sore noted to L hip with bandage

## 2022-05-06 NOTE — H&P (Signed)
NAME:  [name], MRN:  [id number], DOB:  [birth year], LOS: 0 ADMISSION DATE:  05/05/2022, CONSULTATION DATE:  7/23 REFERRING MD:  Darl Householder, CHIEF COMPLAINT:  AMS   History of Present Illness:   [name], is a 87 y.o. female, who presented to the [facility] ED with a chief complaint of altered mental status  They have a pertinent past medical history of alzheimers disease, crohn disease, HTN, CAD s/p MI, TIA, pressure ulcer of left hip  Prior to arrival LKW at [place], patient found by son unresponsive starring, leaning to the rightm drooling. EMS was called  ED course was notable for CT head concerning for small SAH of right frontal operculum, left posterior scalp hematoma without skull fracture. Neurology was consulted.   PCCM was consulted for admission.  Pertinent  Medical History  alzheimers disease, crohn disease, HTN, CAD s/p MI, TIA, pressure ulcer of left hip  Significant Hospital Events: Including procedures, antibiotic start and stop dates in addition to other pertinent events   7/23 presented to [facility].  CT head concerning for small SAH of right frontal operculum, left posterior scalp hematoma without skull fracture. Neuro consult. PCCM consult.  Interim History / Subjective:  See above  afebrile  Subjective: denies SOB, chest pain  Objective   Blood pressure 127/78, pulse 67, temperature (!) 97.2 F (36.2 C), temperature source Oral, resp. rate 18, SpO2 99 %.       No intake or output data in the 24 hours ending 05/03/2022 2326 There were no vitals filed for this visit.  Examination: General: In bed, NAD, appears comfortable, cachetic, elderly HEENT: MM pink/moist, anicteric, atraumatic, ? L posterior scalp hematoma Neuro: RASS 0, PERRL 52m, MAE, pleasantly confused CV: S1S2, NSR, no m/r/g appreciated PULM:  clear in the upper lobes, clear in the lower lobes, trachea midline, chest expansion symmetric GI: soft, bsx4 active, non-tender   Extremities: warm/dry, no  pretibial edema, capillary refill less than 3 seconds  Skin: Pressure injury to left hip, ? L posterior scalp hematoma,  no rashes or lesions noted        Labs: UA neg HGB 9.7 PLT 109 Creat 2.16,  (baseline 1-1.3), bun 66 AST 122, ALT 144,  Albumin 3.4 UC pending  CT head concerning for small SAH of right frontal operculum, left posterior scalp hematoma without skull fracture. CXR: no pneumo, effusion, or infiltrate   Resolved Hospital Problem list     Assessment & Plan:  SAH of right frontal operculum,  left posterior scalp hematoma without skull fracture HX TIA ?unclear etiology, ? Patient fall at some point with hematoma. CT head concerning for small SAH of right frontal operculum, left posterior scalp hematoma without skull fracture. Patient pleasantly confused. -neurology consulted, appreciate assistance with workup -Goal SBP less than 120. Will start cleviprex if goals not met. Currently meeting goals. -Admit to ICU -Continue neuroprotective measures- normothermia, euglycemia, HOB greater than 30, head in neutral alignment, normocapnia, normoxia.  -Neurochecks per unit protocol -AED per neurology. Keppra given in ED. 500 BID -Bedside swallow -PT/OT when able -repeat head CT per neuro.   alzheimers disease -holding Abilify and seroquel  HX HTN HX CAD -continue coreg -continue Lipitor  -holding home plavix -Hold lasix.  Anemia, normocytic Thombocytopenia Appears chronic, no signs of active bleeding. Cachectic.  -Transfuse PRBC if HBG less than 7 -Obtain AM CBC to trend H&H and plt -Monitor for signs of bleeding -If signs of active bleeding or surgical procedure indicated, consider PLT transfusion  less than 50K  Transaminitis Etiology unclear -trend cmp -supportive care  AKI  -Ensure renal perfusion. Goal MAP 65 or greater. -Avoid neprotoxic drugs as possible. -Strict I&O's -Follow up AM creatinine -obtain renal US -LR at 50  pressure ulcer of  left hip -AM wound care consult  Best Practice (right click and "Reselect all SmartList Selections" daily)   Diet/type: Regular consistency (see orders) DVT prophylaxis: SCD GI prophylaxis: PPI Lines: N/A Foley:  N/A Code Status:  full code Last date of multidisciplinary goals of care discussion [pending]  Labs   CBC: Recent Labs  Lab 05/03/2022 2207  WBC 4.9  NEUTROABS 3.8  HGB 9.7*  HCT 29.8*  MCV 99.0  PLT 109*    Basic Metabolic Panel: Recent Labs  Lab 04/17/2022 2207  NA 138  K 4.2  CL 102  CO2 26  GLUCOSE 96  BUN 66*  CREATININE 2.16*  CALCIUM 9.1   GFR: CrCl cannot be calculated (Unknown ideal weight.). Recent Labs  Lab 05/08/2022 2207  WBC 4.9    Liver Function Tests: Recent Labs  Lab 04/24/2022 2207  AST 122*  ALT 144*  ALKPHOS 106  BILITOT 0.7  PROT 6.2*  ALBUMIN 3.4*   No results for input(s): "LIPASE", "AMYLASE" in the last 168 hours. No results for input(s): "AMMONIA" in the last 168 hours.  ABG    Component Value Date/Time   TCO2 20 (L) 11/28/2017 1201     Coagulation Profile: No results for input(s): "INR", "PROTIME" in the last 168 hours.  Cardiac Enzymes: No results for input(s): "CKTOTAL", "CKMB", "CKMBINDEX", "TROPONINI" in the last 168 hours.  HbA1C: Hgb A1c MFr Bld  Date/Time Value Ref Range Status  11/28/2017 12:05 PM 5.7 (H) 4.8 - 5.6 % Final    Comment:    (NOTE) Pre diabetes:          5.7%-6.4% Diabetes:              >6.4% Glycemic control for   <7.0% adults with diabetes   02/03/2014 05:00 AM 5.6 <5.7 % Final    Comment:    (NOTE)                                                                       According to the ADA Clinical Practice Recommendations for 2011, when HbA1c is used as a screening test:  >=6.5%   Diagnostic of Diabetes Mellitus           (if abnormal result is confirmed) 5.7-6.4%   Increased risk of developing Diabetes Mellitus References:Diagnosis and Classification of Diabetes  [place],Diabetes OBSJ,6283,66(QHUTM 1):S62-S69 and Standards of Medical Care in         Diabetes - 2011,Diabetes LYYT,0354,65 (Suppl 1):S11-S61.    CBG: No results for input(s): "GLUCAP" in the last 168 hours.  Review of Systems:   Unable to obtain ROS due to patient status  Past Medical History:  She,  has a past medical history of Acute lateral wall myocardial infarction [facility]) (11/28/2017), Alzheimer disease ([place]), Crohn disease ([place]), History of kidney stones, Hypertension, and TIA (transient ischemic attack) (01/2014).   Surgical History:   Past Surgical History:  Procedure Laterality Date   CARDIAC CATHETERIZATION  11/28/2017   COLECTOMY  DILATION AND CURETTAGE OF UTERUS     ESOPHAGOGASTRODUODENOSCOPY  10/2008   Archie Endo 02/14/2011   LAPAROSCOPIC RIGHT HEMI COLECTOMY  1970s   Archie Endo 5/2/012   LEFT HEART CATH AND CORONARY ANGIOGRAPHY N/A 11/28/2017   Procedure: LEFT HEART CATH AND CORONARY ANGIOGRAPHY;  Surgeon: Charolette Forward, MD;  Location: [facility];  Service: Cardiovascular;  Laterality: N/A;   TONSILLECTOMY       Social History:   reports that she has never smoked. She has never used smokeless tobacco. She reports that she does not drink alcohol and does not use drugs.   Family History:  Her family history is not on file.   Allergies Allergies  Allergen Reactions   Sulfa Antibiotics Anaphylaxis and Rash   Codeine Nausea And Vomiting   Peanut-Containing Drug Products      Home Medications  Prior to Admission medications   Medication Sig Start Date End Date Taking? Authorizing Provider  ARIPiprazole (ABILIFY) 5 MG tablet Take 1 tablet (5 mg total) by mouth daily. 12/08/21   Cameron Sprang, MD  atorvastatin (LIPITOR) 40 MG tablet Take 1 tablet (40 mg total) by mouth daily at 6 PM. Patient taking differently: Take 20 mg by mouth daily at 6 PM. 11/30/17   Charolette Forward, MD  carvedilol (COREG) 6.25 MG tablet Take 6.25 mg by mouth 2 (two) times daily with  a meal.    [provider]  clopidogrel (PLAVIX) 75 MG tablet Take 1 tablet (75 mg total) by mouth daily. 12/01/17   Charolette Forward, MD  furosemide (LASIX) 20 MG tablet Take 40 mg by mouth daily. 05/26/18   [provider]  hydroxypropyl methylcellulose (ISOPTO TEARS) 2.5 % ophthalmic solution Place 1 drop into both eyes 4 (four) times daily as needed for dry eyes.    [provider]  Multiple Vitamins-Minerals (MULTIVITAMIN ADULTS) TABS Take 0.5 tablets by mouth daily.    [provider]  nitroGLYCERIN (NITROSTAT) 0.4 MG SL tablet Place 1 tablet (0.4 mg total) under the tongue every 5 (five) minutes x 3 doses as needed for chest pain. 11/30/17   Charolette Forward, MD  omeprazole (PRILOSEC) 20 MG capsule Take 20 mg by mouth daily.     [provider]  potassium chloride (K-DUR) 10 MEQ tablet Take 10 mEq by mouth daily. 05/26/18   [provider]  QUEtiapine (SEROQUEL) 25 MG tablet Take 0.5 tablets (12.5 mg total) by mouth at bedtime. 12/08/21   Cameron Sprang, MD     Critical care time: n/a    [employer] MSN, APRN, AGACNP-BC [facility]  05/07/2022 , 12:10 AM  Please see [email or web address] for pager details  If no response, please call [phone] After hours, please call Elink at [phone]

## 2022-05-07 ENCOUNTER — Encounter (HOSPITAL_COMMUNITY): Payer: Self-pay | Admitting: Emergency Medicine

## 2022-05-07 ENCOUNTER — Inpatient Hospital Stay (HOSPITAL_COMMUNITY): Payer: Medicare Other

## 2022-05-07 ENCOUNTER — Telehealth: Payer: Self-pay | Admitting: Physician Assistant

## 2022-05-07 DIAGNOSIS — R4182 Altered mental status, unspecified: Secondary | ICD-10-CM | POA: Diagnosis present

## 2022-05-07 DIAGNOSIS — Z515 Encounter for palliative care: Secondary | ICD-10-CM | POA: Diagnosis not present

## 2022-05-07 DIAGNOSIS — I629 Nontraumatic intracranial hemorrhage, unspecified: Secondary | ICD-10-CM | POA: Diagnosis not present

## 2022-05-07 DIAGNOSIS — L89222 Pressure ulcer of left hip, stage 2: Secondary | ICD-10-CM | POA: Diagnosis present

## 2022-05-07 DIAGNOSIS — I2510 Atherosclerotic heart disease of native coronary artery without angina pectoris: Secondary | ICD-10-CM | POA: Diagnosis present

## 2022-05-07 DIAGNOSIS — N179 Acute kidney failure, unspecified: Secondary | ICD-10-CM | POA: Diagnosis present

## 2022-05-07 DIAGNOSIS — Z8673 Personal history of transient ischemic attack (TIA), and cerebral infarction without residual deficits: Secondary | ICD-10-CM | POA: Diagnosis not present

## 2022-05-07 DIAGNOSIS — R1310 Dysphagia, unspecified: Secondary | ICD-10-CM | POA: Diagnosis present

## 2022-05-07 DIAGNOSIS — L89229 Pressure ulcer of left hip, unspecified stage: Secondary | ICD-10-CM | POA: Diagnosis present

## 2022-05-07 DIAGNOSIS — D61818 Other pancytopenia: Secondary | ICD-10-CM | POA: Diagnosis present

## 2022-05-07 DIAGNOSIS — E43 Unspecified severe protein-calorie malnutrition: Secondary | ICD-10-CM | POA: Diagnosis present

## 2022-05-07 DIAGNOSIS — F0280 Dementia in other diseases classified elsewhere without behavioral disturbance: Secondary | ICD-10-CM | POA: Diagnosis present

## 2022-05-07 DIAGNOSIS — R001 Bradycardia, unspecified: Secondary | ICD-10-CM | POA: Diagnosis not present

## 2022-05-07 DIAGNOSIS — N1832 Chronic kidney disease, stage 3b: Secondary | ICD-10-CM | POA: Diagnosis present

## 2022-05-07 DIAGNOSIS — I129 Hypertensive chronic kidney disease with stage 1 through stage 4 chronic kidney disease, or unspecified chronic kidney disease: Secondary | ICD-10-CM | POA: Diagnosis present

## 2022-05-07 DIAGNOSIS — Z681 Body mass index (BMI) 19 or less, adult: Secondary | ICD-10-CM | POA: Diagnosis not present

## 2022-05-07 DIAGNOSIS — E876 Hypokalemia: Secondary | ICD-10-CM | POA: Diagnosis present

## 2022-05-07 DIAGNOSIS — R569 Unspecified convulsions: Secondary | ICD-10-CM | POA: Diagnosis present

## 2022-05-07 DIAGNOSIS — G309 Alzheimer's disease, unspecified: Secondary | ICD-10-CM | POA: Diagnosis present

## 2022-05-07 DIAGNOSIS — I609 Nontraumatic subarachnoid hemorrhage, unspecified: Secondary | ICD-10-CM

## 2022-05-07 DIAGNOSIS — W19XXXA Unspecified fall, initial encounter: Secondary | ICD-10-CM | POA: Diagnosis present

## 2022-05-07 DIAGNOSIS — N184 Chronic kidney disease, stage 4 (severe): Secondary | ICD-10-CM | POA: Diagnosis not present

## 2022-05-07 DIAGNOSIS — B179 Acute viral hepatitis, unspecified: Secondary | ICD-10-CM | POA: Diagnosis present

## 2022-05-07 DIAGNOSIS — Y92009 Unspecified place in unspecified non-institutional (private) residence as the place of occurrence of the external cause: Secondary | ICD-10-CM | POA: Diagnosis not present

## 2022-05-07 DIAGNOSIS — S066XAA Traumatic subarachnoid hemorrhage with loss of consciousness status unknown, initial encounter: Secondary | ICD-10-CM | POA: Diagnosis present

## 2022-05-07 DIAGNOSIS — G9349 Other encephalopathy: Secondary | ICD-10-CM | POA: Diagnosis present

## 2022-05-07 DIAGNOSIS — S0003XA Contusion of scalp, initial encounter: Secondary | ICD-10-CM | POA: Diagnosis present

## 2022-05-07 DIAGNOSIS — Z66 Do not resuscitate: Secondary | ICD-10-CM | POA: Diagnosis not present

## 2022-05-07 DIAGNOSIS — Z7189 Other specified counseling: Secondary | ICD-10-CM | POA: Diagnosis not present

## 2022-05-07 DIAGNOSIS — R64 Cachexia: Secondary | ICD-10-CM | POA: Diagnosis present

## 2022-05-07 DIAGNOSIS — K5090 Crohn's disease, unspecified, without complications: Secondary | ICD-10-CM | POA: Diagnosis present

## 2022-05-07 DIAGNOSIS — D631 Anemia in chronic kidney disease: Secondary | ICD-10-CM | POA: Diagnosis present

## 2022-05-07 LAB — CBC
HCT: 25.7 % — ABNORMAL LOW (ref 36.0–46.0)
Hemoglobin: 8.4 g/dL — ABNORMAL LOW (ref 12.0–15.0)
MCH: 31.9 pg (ref 26.0–34.0)
MCHC: 32.7 g/dL (ref 30.0–36.0)
MCV: 97.7 fL (ref 80.0–100.0)
Platelets: 89 10*3/uL — ABNORMAL LOW (ref 150–400)
RBC: 2.63 MIL/uL — ABNORMAL LOW (ref 3.87–5.11)
RDW: 14.2 % (ref 11.5–15.5)
WBC: 4.4 10*3/uL (ref 4.0–10.5)
nRBC: 0 % (ref 0.0–0.2)

## 2022-05-07 LAB — COMPREHENSIVE METABOLIC PANEL
ALT: 122 U/L — ABNORMAL HIGH (ref 0–44)
AST: 104 U/L — ABNORMAL HIGH (ref 15–41)
Albumin: 2.9 g/dL — ABNORMAL LOW (ref 3.5–5.0)
Alkaline Phosphatase: 104 U/L (ref 38–126)
Anion gap: 9 (ref 5–15)
BUN: 63 mg/dL — ABNORMAL HIGH (ref 8–23)
CO2: 24 mmol/L (ref 22–32)
Calcium: 8.4 mg/dL — ABNORMAL LOW (ref 8.9–10.3)
Chloride: 106 mmol/L (ref 98–111)
Creatinine, Ser: 1.86 mg/dL — ABNORMAL HIGH (ref 0.44–1.00)
GFR, Estimated: 26 mL/min — ABNORMAL LOW (ref >60)
Glucose, Bld: 86 mg/dL (ref 70–99)
Potassium: 4 mmol/L (ref 3.5–5.1)
Sodium: 139 mmol/L (ref 135–145)
Total Bilirubin: 0.5 mg/dL (ref 0.3–1.2)
Total Protein: 5.7 g/dL — ABNORMAL LOW (ref 6.5–8.1)

## 2022-05-07 LAB — PROTIME-INR
INR: 1.2 (ref 0.8–1.2)
Prothrombin Time: 14.9 seconds (ref 11.4–15.2)

## 2022-05-07 LAB — URINE CULTURE: Culture: NO GROWTH

## 2022-05-07 LAB — MAGNESIUM: Magnesium: 1.5 mg/dL — ABNORMAL LOW (ref 1.7–2.4)

## 2022-05-07 LAB — APTT: aPTT: 28 seconds (ref 24–36)

## 2022-05-07 LAB — MRSA NEXT GEN BY PCR, NASAL: MRSA by PCR Next Gen: NOT DETECTED

## 2022-05-07 MED ORDER — MAGNESIUM SULFATE 2 GM/50ML IV SOLN
2.0000 g | Freq: Once | INTRAVENOUS | Status: AC
  Administered 2022-05-07: 2 g via INTRAVENOUS
  Filled 2022-05-07: qty 50

## 2022-05-07 MED ORDER — CHLORHEXIDINE GLUCONATE CLOTH 2 % EX PADS
6.0000 | MEDICATED_PAD | Freq: Every day | CUTANEOUS | Status: DC
  Administered 2022-05-07 – 2022-05-10 (×4): 6 via TOPICAL

## 2022-05-07 MED ORDER — SODIUM CHLORIDE 0.9 % IV SOLN: 250.0000 mg | Freq: Two times a day (BID) | INTRAVENOUS | Status: DC

## 2022-05-07 MED ORDER — ADULT MULTIVITAMIN W/MINERALS CH
0.5000 | ORAL_TABLET | Freq: Every day | ORAL | Status: DC
  Administered 2022-05-07 – 2022-05-09 (×3): 0.5 via ORAL
  Filled 2022-05-07 (×3): qty 1

## 2022-05-07 MED ORDER — DOCUSATE SODIUM 100 MG PO CAPS: 100.0000 mg | ORAL_CAPSULE | Freq: Two times a day (BID) | ORAL | Status: DC | PRN

## 2022-05-07 MED ORDER — POLYETHYLENE GLYCOL 3350 17 G PO PACK: 17.0000 g | PACK | Freq: Every day | ORAL | Status: DC | PRN

## 2022-05-07 MED ORDER — CARVEDILOL 3.125 MG PO TABS
6.2500 mg | ORAL_TABLET | Freq: Two times a day (BID) | ORAL | Status: DC
  Administered 2022-05-07 (×2): 6.25 mg via ORAL
  Filled 2022-05-07 (×2): qty 2

## 2022-05-07 MED ORDER — ORAL CARE MOUTH RINSE
15.0000 mL | OROMUCOSAL | Status: DC
  Administered 2022-05-07 – 2022-05-11 (×14): 15 mL via OROMUCOSAL

## 2022-05-07 MED ORDER — ATORVASTATIN CALCIUM 10 MG PO TABS
20.0000 mg | ORAL_TABLET | Freq: Every day | ORAL | Status: DC
  Administered 2022-05-07 – 2022-05-09 (×3): 20 mg via ORAL
  Filled 2022-05-07 (×3): qty 2

## 2022-05-07 MED ORDER — ONDANSETRON HCL 4 MG/2ML IJ SOLN: 4.0000 mg | Freq: Four times a day (QID) | INTRAMUSCULAR | Status: DC | PRN

## 2022-05-07 MED ORDER — ORAL CARE MOUTH RINSE
15.0000 mL | OROMUCOSAL | Status: DC | PRN
Start: 2022-05-07 — End: 2022-05-11

## 2022-05-07 MED ORDER — LEVETIRACETAM 250 MG PO TABS
250.0000 mg | ORAL_TABLET | Freq: Two times a day (BID) | ORAL | Status: DC
  Administered 2022-05-07 – 2022-05-09 (×6): 250 mg via ORAL
  Filled 2022-05-07 (×6): qty 1

## 2022-05-07 MED ORDER — LACTATED RINGERS IV SOLN: INTRAVENOUS | Status: DC

## 2022-05-07 MED ORDER — POLYVINYL ALCOHOL 1.4 % OP SOLN: 1.0000 [drp] | Freq: Four times a day (QID) | OPHTHALMIC | Status: DC | PRN

## 2022-05-07 MED ORDER — HYDRALAZINE HCL 20 MG/ML IJ SOLN: 10.0000 mg | Freq: Four times a day (QID) | INTRAMUSCULAR | Status: DC | PRN

## 2022-05-07 MED ORDER — ORAL CARE MOUTH RINSE: 15.0000 mL | OROMUCOSAL | Status: DC | PRN

## 2022-05-07 MED ORDER — HYDRALAZINE HCL 20 MG/ML IJ SOLN
10.0000 mg | Freq: Four times a day (QID) | INTRAMUSCULAR | Status: DC | PRN
  Administered 2022-05-08 – 2022-05-10 (×2): 10 mg via INTRAVENOUS
  Filled 2022-05-07 (×2): qty 1

## 2022-05-07 NOTE — Plan of Care (Signed)
  Problem: Clinical Measurements: Goal: Ability to maintain clinical measurements within normal limits will improve Outcome: Progressing   Problem: Clinical Measurements: Goal: Will remain free from infection Outcome: Progressing   Problem: Nutrition: Goal: Adequate nutrition will be maintained Outcome: Progressing   Problem: Activity: Goal: Risk for activity intolerance will decrease Outcome: Progressing   Problem: Elimination: Goal: Will not experience complications related to urinary retention Outcome: Progressing   Problem: Safety: Goal: Ability to remain free from injury will improve Outcome: Progressing   Problem: Skin Integrity: Goal: Risk for impaired skin integrity will decrease Outcome: Progressing

## 2022-05-07 NOTE — Telephone Encounter (Signed)
Patient was taken to the ED after an episode at dinner.  She now has a small bleed on her brain that a CT scan showed.  Patient quickly returned to baseline but is still admitted to [facility] neuro ICU due to the brain bleed.  FYI only, son will call back when patient returns home and gets settled.

## 2022-05-07 NOTE — Progress Notes (Addendum)
Subjective: Patient is reportedly fairly close to baseline, no further seizures  Exam: Vitals:   05/07/22 0800 05/07/22 0900  BP: (!) 147/75 104/74  Pulse: (!) 55 (!) 50  Resp: 14 15  Temp: (!) 97.2 F (36.2 C)   SpO2: 100% 100%   Gen: In bed, NAD Resp: non-labored breathing, no acute distress Abd: soft, nt  Neuro: MS: She is awake, knows she is in the hospital but not which one, not oriented CN: She does not cooperate with visual field testing, she does look cross midline in both directions, blinks to eyelid stimulation bilaterally Motor: She has increased tone throughout, but is able to follow commands in all four extremities Sensory: Reports symmetric sensation   Repeat head CT is stable  Impression: 87 year old female with a history of dementia with new onset partial seizures.  Agree with antiepileptic therapy with Keppra.  Repeat head CT is stable, and I suspect that this was just a small traumatic subarachnoid.  Recommendations: 1) continue Keppra 250 twice daily 2) she can follow-up with outpatient neurology.  Neurology be available as needed.  Roland Rack, MD [facility] [phone]  If 7pm- 7am, please page neurology on call as listed in [place].

## 2022-05-07 NOTE — Progress Notes (Signed)
[name] DOB: 20-Mar-1935 MRN: [id number]   Date of Service  05/07/2022  HPI/Events of Note  Notified of Mg 1.5, crea improving to 1.86 from 2.16. K 4.0.  eICU Interventions  Replete Mg - 2g IV MgSO4 ordered.     Intervention Category Minor Interventions: Electrolytes abnormality - evaluation and management  Elsie Lincoln 05/07/2022, 4:33 AM

## 2022-05-07 NOTE — Progress Notes (Signed)
EEG completed, results pending. 

## 2022-05-07 NOTE — Procedures (Signed)
Patient Name: [name]  MRN: [id number]  Epilepsy Attending: Lora Havens  Referring Physician/Provider: Donnetta Simpers, MD  Date: 05/07/2022 Duration: 23.33 mins  Patient history: 87 y.o. female with PMH significant for alzheimers, TIA, Crohns, nephrolithiasis, HTN who presents with episode of starring off, R cheek tense and then somnolence. EEG to evaluate for seizure.   Level of alertness: Awake, asleep  AEDs during EEG study: LEV  Technical aspects: This EEG study was done with scalp electrodes positioned according to the 10-20 International system of electrode placement. Electrical activity was acquired at a sampling rate of 500Hz  and reviewed with a high frequency filter of 70Hz  and a low frequency filter of 1Hz . EEG data were recorded continuously and digitally stored.   Description: No clear posterior dominant rhythm was seen.  Sleep was characterized by vertex waves, sleep spindles (12 to 14 Hz), maximal frontocentral region. EEG showed continuous generalized 3 to 6 Hz theta-delta slowing. Hyperventilation and photic stimulation were not performed.     ABNORMALITY - Continuous slow, generalized  IMPRESSION: This study is suggestive of moderate diffuse encephalopathy, nonspecific etiology. No seizures or epileptiform discharges were seen throughout the recording.  Khy Pitre Barbra Sarks

## 2022-05-07 NOTE — Progress Notes (Signed)
Received pt from the ED, with her clothes in the bag, alert but confused, MD orders completed

## 2022-05-07 NOTE — Progress Notes (Signed)
NAME:  [name], MRN:  [id number], DOB:  Apr 21, 1935, LOS: 0 ADMISSION DATE:  04/19/2022, CONSULTATION DATE:  7/23 REFERRING MD:  Darl Householder, CHIEF COMPLAINT:  AMS   History of Present Illness:   [name], is a 87 y.o. female, who presented to the [facility] ED with a chief complaint of altered mental status  They have a pertinent past medical history of alzheimers disease, crohn disease, HTN, CAD s/p MI, TIA, pressure ulcer of left hip  Prior to arrival LKW at [place], patient found by son unresponsive starring, leaning to the rightm drooling. EMS was called  ED course was notable for CT head concerning for small SAH of right frontal operculum, left posterior scalp hematoma without skull fracture. Neurology was consulted.   PCCM was consulted for admission.  Pertinent  Medical History  alzheimers disease, crohn disease, HTN, CAD s/p MI, TIA, pressure ulcer of left hip  Significant Hospital Events: Including procedures, antibiotic start and stop dates in addition to other pertinent events   7/23 presented to [facility].  CT head concerning for small SAH of right frontal operculum, left posterior scalp hematoma without skull fracture. Neuro consult. PCCM consult.  Interim History / Subjective:  Remain confused No overnight issues EEG leads are being placed  Objective   Blood pressure 104/74, pulse (!) 50, temperature (!) 97.2 F (36.2 C), temperature source Axillary, resp. rate 15, weight 30.8 kg, SpO2 100 %.        Intake/Output Summary (Last 24 hours) at 05/07/2022 1014 Last data filed at 05/07/2022 0800 Gross per 24 hour  Intake 1285.86 ml  Output --  Net 1285.86 ml   Filed Weights   05/07/22 0155  Weight: 30.8 kg    Examination: Physical exam: General: Chronically ill-appearing female, lying on the bed HEENT: [place]/AT, eyes anicteric.  moist mucus membranes Neuro: Eyes closed, opens intermittently, following commands, moving all 4 extremities Chest: Coarse breath sounds, no  wheezes or rhonchi Heart: Regular rate and rhythm, no murmurs or gallops Abdomen: Soft, nontender, nondistended, bowel sounds present Skin: Pressure injury to left hip, L posterior scalp hematoma,  no rashes or lesions noted        CT head concerning for small SAH of right frontal operculum, left posterior scalp hematoma without skull fracture. CXR: no pneumo, effusion, or infiltrate   Resolved Hospital Problem list     Assessment & Plan:  Right frontal subarachnoid hemorrhage, likely traumatic L posterior scalp hematoma without skull fracture Probable seizures Likely in the setting of fall though no history is provided CT head showed small right frontal subarachnoid hemorrhage, repeat head CT in 6 hours looks stable Patient pleasantly confused, probably this is her baseline Maintain SBP 130-150 Continue neuro check every hour Appreciate neurology recommendations Patient was given 500 mg Keppra, now on 250 twice daily EEG is ongoing which showed no active seizures Bedside swallow PT/OT when able Neurology recommended MRI with and without contrast to rule out vascular abnormalities  Alzheimer's dementia Hold home meds for now Continue supportive care  HTN CAD Continue coreg and Lipitor Hold Plavix  Anemia of chronic disease Thombocytopenia Appears chronic, no signs of active bleeding. Cachectic.  Transfuse PRBC if HBG less than 7 Monitor for signs of bleeding  Acute hepatitis of unclear etiology LFTs are elevated but trending down INR is normal Closely monitor, if does not improve will get ultrasound right upper quadrant  AKI on CKD stage IIIb likely due to poor oral intake Hypokalemia Hypomagnesemia Closely monitor serum  creatinine Monitor intake and output Avoid nephrotoxic agent Continue supplement electrolytes Continue gentle IV fluid hydration  Unstageable pressure ulcer of left hip, POA Continue wound care  Best Practice (right click and  "Reselect all SmartList Selections" daily)   Diet/type: Regular consistency (see orders) DVT prophylaxis: SCD GI prophylaxis: PPI Lines: N/A Foley:  N/A Code Status:  full code Last date of multidisciplinary goals of care discussion [pending]  Labs   CBC: Recent Labs  Lab 05/03/2022 2207 05/07/22 0314  WBC 4.9 4.4  NEUTROABS 3.8  --   HGB 9.7* 8.4*  HCT 29.8* 25.7*  MCV 99.0 97.7  PLT 109* 89*    Basic Metabolic Panel: Recent Labs  Lab 04/27/2022 2207 05/07/22 0314  NA 138 139  K 4.2 4.0  CL 102 106  CO2 26 24  GLUCOSE 96 86  BUN 66* 63*  CREATININE 2.16* 1.86*  CALCIUM 9.1 8.4*  MG  --  1.5*   GFR: CrCl cannot be calculated (Unknown ideal weight.). Recent Labs  Lab 05/08/2022 2207 05/07/22 0314  WBC 4.9 4.4    Liver Function Tests: Recent Labs  Lab 04/15/2022 2207 05/07/22 0314  AST 122* 104*  ALT 144* 122*  ALKPHOS 106 104  BILITOT 0.7 0.5  PROT 6.2* 5.7*  ALBUMIN 3.4* 2.9*   No results for input(s): "LIPASE", "AMYLASE" in the last 168 hours. No results for input(s): "AMMONIA" in the last 168 hours.  ABG    Component Value Date/Time   TCO2 20 (L) 11/28/2017 1201     Coagulation Profile: Recent Labs  Lab 05/07/22 0314  INR 1.2    Cardiac Enzymes: No results for input(s): "CKTOTAL", "CKMB", "CKMBINDEX", "TROPONINI" in the last 168 hours.  HbA1C: Hgb A1c MFr Bld  Date/Time Value Ref Range Status  11/28/2017 12:05 PM 5.7 (H) 4.8 - 5.6 % Final    Comment:    (NOTE) Pre diabetes:          5.7%-6.4% Diabetes:              >6.4% Glycemic control for   <7.0% adults with diabetes   02/03/2014 05:00 AM 5.6 <5.7 % Final    Comment:    (NOTE)                                                                       According to the ADA Clinical Practice Recommendations for 2011, when HbA1c is used as a screening test:  >=6.5%   Diagnostic of Diabetes Mellitus           (if abnormal result is confirmed) 5.7-6.4%   Increased risk of developing  Diabetes Mellitus References:Diagnosis and Classification of Diabetes Mellitus,Diabetes HUTM,5465,03(TWSFK 1):S62-S69 and Standards of Medical Care in         Diabetes - 2011,Diabetes CLEX,5170,01 (Suppl 1):S11-S61.    CBG: No results for input(s): "GLUCAP" in the last 168 hours.  Critical care time:     Total critical care time: 32 minutes  Performed by: [place] care time was exclusive of separately billable procedures and treating other patients.   Critical care was necessary to treat or prevent imminent or life-threatening deterioration.   Critical care was time spent personally by me on the following  activities: development of treatment plan with patient and/or surrogate as well as nursing, discussions with consultants, evaluation of patient's response to treatment, examination of patient, obtaining history from patient or surrogate, ordering and performing treatments and interventions, ordering and review of laboratory studies, ordering and review of radiographic studies, pulse oximetry and re-evaluation of patient's condition.   Jacky Kindle, MD  Pulmonary Critical Care See Amion for pager If no response to pager, please call [phone] until 7pm After 7pm, Please call E-link [phone]

## 2022-05-07 NOTE — Consult Note (Signed)
WOC Nurse Consult Note: Reason for Consult: Healing stage 3 pressure injury to left hip Wound type:Pressure Pressure Injury POA: Yes Measurement:0.6cm x 0.5cm x 0.4cm Wound DGL:OVFI, moist Drainage (amount, consistency, odor) small serous to light yellow Periwound: with evidence of previous wound healing/scarring Dressing procedure/placement/frequency: Wound is contracting and scarring, only a small area remains, but is is draining serous to light yellow. I have provided Nursing with guidance for placement of a silver hydrofiber after cleansing with soap and water and drying, this is to be covered with a dry gauze 2x2 and topped with a silicone foam.   Pressure redistribution heel boots are also provided today.  Turning and repositioning is in place and a sacral prophylactic foam dressing is placed over the intact skin of the sacrum.  [place] nursing team will not follow, but will remain available to this patient, the nursing and medical teams.  Please re-consult if needed.  POC discussed with Bedside RN.  Thank you for inviting [place] to participate in this patient's Plan of Care.  Maudie Flakes, MSN, RN, CNS, [place], Serita Grammes, [employer], [employer] phone:  [phone]

## 2022-05-07 NOTE — Progress Notes (Signed)
eLink Physician-Brief Progress Note Patient Name: [name] DOB: 05-Aug-1935 MRN: [id number]   Date of Service  05/07/2022  HPI/Events of Note  86/F with Alzheimer's disease, Crohn's disease, HTN, CAD, hx of TIA, brought in via EMS after she was noted by the son te be starting blankly, leaning to the right and drooling.  CT  showed 1. Small focus of subarachnoid hemorrhage underlying the right frontal operculum, likely traumatic; and 2. Left posterior scalp hematoma without skull fracture.  BP 119/90, HR 58, RR 10, O2 sats 100%  eICU Interventions  SAH, on plvix Left posterior scalp hematoma Dementia Hypertension CKD Thrombocytopenia  Neurology has evaluated the patient.  Per recommendations, keep SBP <150.  Keppra also started.  Continue coreg.   SCDs for DVT prophylaxis.  Repeat CT head ordered.      Intervention Category Evaluation Type: New Patient Evaluation  Elsie Lincoln 05/07/2022, 2:09 AM

## 2022-05-08 DIAGNOSIS — L89222 Pressure ulcer of left hip, stage 2: Secondary | ICD-10-CM | POA: Diagnosis not present

## 2022-05-08 DIAGNOSIS — N179 Acute kidney failure, unspecified: Secondary | ICD-10-CM | POA: Diagnosis not present

## 2022-05-08 DIAGNOSIS — I609 Nontraumatic subarachnoid hemorrhage, unspecified: Secondary | ICD-10-CM | POA: Diagnosis not present

## 2022-05-08 LAB — BASIC METABOLIC PANEL
Anion gap: 8 (ref 5–15)
BUN: 56 mg/dL — ABNORMAL HIGH (ref 8–23)
CO2: 23 mmol/L (ref 22–32)
Calcium: 8.7 mg/dL — ABNORMAL LOW (ref 8.9–10.3)
Chloride: 105 mmol/L (ref 98–111)
Creatinine, Ser: 1.67 mg/dL — ABNORMAL HIGH (ref 0.44–1.00)
GFR, Estimated: 30 mL/min — ABNORMAL LOW (ref >60)
Glucose, Bld: 85 mg/dL (ref 70–99)
Potassium: 5 mmol/L (ref 3.5–5.1)
Sodium: 136 mmol/L (ref 135–145)

## 2022-05-08 LAB — CBC WITH DIFFERENTIAL/PLATELET
Abs Immature Granulocytes: 0 10*3/uL (ref 0.00–0.07)
Basophils Absolute: 0 10*3/uL (ref 0.0–0.1)
Basophils Relative: 0 %
Eosinophils Absolute: 0 10*3/uL (ref 0.0–0.5)
Eosinophils Relative: 1 %
HCT: 25.2 % — ABNORMAL LOW (ref 36.0–46.0)
Hemoglobin: 8.4 g/dL — ABNORMAL LOW (ref 12.0–15.0)
Immature Granulocytes: 0 %
Lymphocytes Relative: 26 %
Lymphs Abs: 0.8 10*3/uL (ref 0.7–4.0)
MCH: 32.3 pg (ref 26.0–34.0)
MCHC: 33.3 g/dL (ref 30.0–36.0)
MCV: 96.9 fL (ref 80.0–100.0)
Monocytes Absolute: 0.2 10*3/uL (ref 0.1–1.0)
Monocytes Relative: 6 %
Neutro Abs: 2.1 10*3/uL (ref 1.7–7.7)
Neutrophils Relative %: 67 %
Platelets: 88 10*3/uL — ABNORMAL LOW (ref 150–400)
RBC: 2.6 MIL/uL — ABNORMAL LOW (ref 3.87–5.11)
RDW: 14 % (ref 11.5–15.5)
WBC: 3.2 10*3/uL — ABNORMAL LOW (ref 4.0–10.5)
nRBC: 0 % (ref 0.0–0.2)

## 2022-05-08 LAB — MAGNESIUM: Magnesium: 2.1 mg/dL (ref 1.7–2.4)

## 2022-05-08 LAB — PHOSPHORUS: Phosphorus: 3.2 mg/dL (ref 2.5–4.6)

## 2022-05-08 MED ORDER — MAGNESIUM SULFATE 4 GM/100ML IV SOLN
4.0000 g | Freq: Once | INTRAVENOUS | Status: DC
  Filled 2022-05-08: qty 100

## 2022-05-08 MED ORDER — CARVEDILOL 3.125 MG PO TABS
3.1250 mg | ORAL_TABLET | Freq: Two times a day (BID) | ORAL | Status: DC
  Filled 2022-05-08: qty 1

## 2022-05-08 NOTE — Progress Notes (Signed)
NAME:  [name], MRN:  [id number], DOB:  November 11, 1934, LOS: 1 ADMISSION DATE:  05/12/2022, CONSULTATION DATE:  7/23 REFERRING MD:  Darl Householder, CHIEF COMPLAINT:  AMS   History of Present Illness:   [name], is a 87 y.o. female, who presented to the [facility] ED with a chief complaint of altered mental status  They have a pertinent past medical history of alzheimers disease, crohn disease, HTN, CAD s/p MI, TIA, pressure ulcer of left hip  Prior to arrival [facility] at [place], patient found by son unresponsive starring, leaning to the rightm drooling. EMS was called  ED course was notable for CT head concerning for small SAH of right frontal operculum, left posterior scalp hematoma without skull fracture. Neurology was consulted.   PCCM was consulted for admission.  Pertinent  Medical History  alzheimers disease, crohn disease, HTN, CAD s/p MI, TIA, pressure ulcer of left hip  Significant Hospital Events: Including procedures, antibiotic start and stop dates in addition to other pertinent events   7/23 presented to [facility].  CT head concerning for small SAH of right frontal operculum, left posterior scalp hematoma without skull fracture. Neuro consult. PCCM consult.  Interim History / Subjective:  Remain confused She was bradycardic throughout the night with heart rate into 30s but blood pressure remained stable  Objective   Blood pressure 133/70, pulse (!) 50, temperature (!) 97.4 F (36.3 C), temperature source Axillary, resp. rate 14, weight 30.4 kg, SpO2 100 %.        Intake/Output Summary (Last 24 hours) at 05/08/2022 0804 Last data filed at 05/08/2022 0600 Gross per 24 hour  Intake 1112.16 ml  Output --  Net 1112.16 ml   Filed Weights   05/07/22 0155 05/08/22 0500  Weight: 30.8 kg 30.4 kg    Examination: Physical exam: General: Chronically ill-appearing female, lying on the bed HEENT: [place]/AT, eyes anicteric.  moist mucus membranes Neuro: Opens eyes with vocal stimuli, following  simple commands intermittently, moving all 4 extremities  Chest: Coarse breath sounds, no wheezes or rhonchi Heart: Regular rate and rhythm, no murmurs or gallops Abdomen: Soft, nontender, nondistended, bowel sounds present Skin: Pressure injury to left hip, L posterior scalp hematoma,  no rashes or lesions noted        Resolved Hospital Problem list     Assessment & Plan:  Right frontal subarachnoid hemorrhage, likely traumatic L posterior scalp hematoma without skull fracture Probable seizures Likely in the setting of fall though no history is provided CT head showed small right frontal subarachnoid hemorrhage, repeat head CT in 6 hours looks stable Patient pleasantly confused, ( baseline) Per patient's son Maintain SBP 130-150 Continue neuro check every hour Patient was given 500 mg Keppra at admission, now on 250 twice daily EEG is negative for active seizures PT/OT  Alzheimer's dementia Hold home meds for now Continue supportive care  HTN CAD Patient became bradycardic overnight We will stop Coreg Continue Lipitor Hold Plavix  Anemia of chronic disease Thombocytopenia Appears chronic, no signs of active bleeding. Cachectic.  Transfuse PRBC if HBG less than 7 Monitor for signs of bleeding  Acute hepatitis of unclear etiology LFTs are elevated but trending down INR is normal Closely monitor, if does not improve will get ultrasound right upper quadrant  AKI on CKD stage IIIb likely due to poor oral intake Hypokalemia Hypomagnesemia Closely monitor serum creatinine Monitor intake and output Avoid nephrotoxic agent Continue supplement electrolytes Continue gentle IV fluid hydration Repeat labs are pending  Unstageable  pressure ulcer of left hip, POA Continue wound care  Best Practice (right click and "Reselect all SmartList Selections" daily)   Diet/type: Regular consistency (see orders) DVT prophylaxis: SCD GI prophylaxis: PPI Lines: N/A Foley:   N/A Code Status:  full code Last date of multidisciplinary goals of care discussion [unable to reach patient's son over the phone]  Labs   CBC: Recent Labs  Lab 04/23/2022 2207 05/07/22 0314  WBC 4.9 4.4  NEUTROABS 3.8  --   HGB 9.7* 8.4*  HCT 29.8* 25.7*  MCV 99.0 97.7  PLT 109* 89*    Basic Metabolic Panel: Recent Labs  Lab 04/17/2022 2207 05/07/22 0314  NA 138 139  K 4.2 4.0  CL 102 106  CO2 26 24  GLUCOSE 96 86  BUN 66* 63*  CREATININE 2.16* 1.86*  CALCIUM 9.1 8.4*  MG  --  1.5*   GFR: CrCl cannot be calculated (Unknown ideal weight.). Recent Labs  Lab 04/30/2022 2207 05/07/22 0314  WBC 4.9 4.4    Liver Function Tests: Recent Labs  Lab 05/14/2022 2207 05/07/22 0314  AST 122* 104*  ALT 144* 122*  ALKPHOS 106 104  BILITOT 0.7 0.5  PROT 6.2* 5.7*  ALBUMIN 3.4* 2.9*   No results for input(s): "LIPASE", "AMYLASE" in the last 168 hours. No results for input(s): "AMMONIA" in the last 168 hours.  ABG    Component Value Date/Time   TCO2 20 (L) 11/28/2017 1201     Coagulation Profile: Recent Labs  Lab 05/07/22 0314  INR 1.2    Cardiac Enzymes: No results for input(s): "CKTOTAL", "CKMB", "CKMBINDEX", "TROPONINI" in the last 168 hours.  HbA1C: Hgb A1c MFr Bld  Date/Time Value Ref Range Status  11/28/2017 12:05 PM 5.7 (H) 4.8 - 5.6 % Final    Comment:    (NOTE) Pre diabetes:          5.7%-6.4% Diabetes:              >6.4% Glycemic control for   <7.0% adults with diabetes   02/03/2014 05:00 AM 5.6 <5.7 % Final    Comment:    (NOTE)                                                                       According to the ADA Clinical Practice Recommendations for 2011, when HbA1c is used as a screening test:  >=6.5%   Diagnostic of Diabetes Mellitus           (if abnormal result is confirmed) 5.7-6.4%   Increased risk of developing Diabetes Mellitus References:Diagnosis and Classification of Diabetes Mellitus,Diabetes PZWC,5852,77(OEUMP  1):S62-S69 and Standards of Medical Care in         Diabetes - 2011,Diabetes NTIR,4431,54 (Suppl 1):S11-S61.    CBG: No results for input(s): "GLUCAP" in the last 168 hours.    Jacky Kindle, MD [facility] See Amion for pager If no response to pager, please call [phone] until 7pm After 7pm, Please call E-link [phone]

## 2022-05-08 NOTE — Progress Notes (Signed)
Patient HR 26-33 this morning.  Patient asleep and awoken by RN.  Patient nonsymptomatic and HR 46-57 when awake.  MD notified and observed patient at bedside.

## 2022-05-09 ENCOUNTER — Ambulatory Visit: Payer: Medicare Other | Admitting: Physician Assistant

## 2022-05-09 DIAGNOSIS — L89222 Pressure ulcer of left hip, stage 2: Secondary | ICD-10-CM | POA: Diagnosis not present

## 2022-05-09 DIAGNOSIS — I609 Nontraumatic subarachnoid hemorrhage, unspecified: Secondary | ICD-10-CM | POA: Diagnosis not present

## 2022-05-09 DIAGNOSIS — I629 Nontraumatic intracranial hemorrhage, unspecified: Secondary | ICD-10-CM

## 2022-05-09 DIAGNOSIS — N179 Acute kidney failure, unspecified: Secondary | ICD-10-CM | POA: Diagnosis not present

## 2022-05-09 LAB — MAGNESIUM: Magnesium: 2.1 mg/dL (ref 1.7–2.4)

## 2022-05-09 MED ORDER — ARIPIPRAZOLE 10 MG PO TABS
5.0000 mg | ORAL_TABLET | Freq: Every day | ORAL | Status: DC
  Administered 2022-05-09: 5 mg via ORAL
  Filled 2022-05-09: qty 1

## 2022-05-09 MED ORDER — QUETIAPINE FUMARATE 25 MG PO TABS
12.5000 mg | ORAL_TABLET | Freq: Every day | ORAL | Status: DC
  Administered 2022-05-09: 12.5 mg via ORAL
  Filled 2022-05-09: qty 1

## 2022-05-09 MED ORDER — CARVEDILOL 6.25 MG PO TABS: 6.2500 mg | ORAL_TABLET | Freq: Two times a day (BID) | ORAL | Status: DC

## 2022-05-09 MED ORDER — PANTOPRAZOLE SODIUM 40 MG PO TBEC
40.0000 mg | DELAYED_RELEASE_TABLET | Freq: Every day | ORAL | Status: DC
  Administered 2022-05-09: 40 mg via ORAL
  Filled 2022-05-09: qty 1

## 2022-05-09 MED ORDER — HYDRALAZINE HCL 10 MG PO TABS
10.0000 mg | ORAL_TABLET | Freq: Three times a day (TID) | ORAL | Status: DC
  Administered 2022-05-09 (×2): 10 mg via ORAL
  Filled 2022-05-09 (×2): qty 1

## 2022-05-09 NOTE — Progress Notes (Signed)
Patient noted more teary and rattling with breathing. On call MD made aware. Report given to on coming RN.

## 2022-05-09 NOTE — Evaluation (Signed)
Physical Therapy Evaluation Patient Details Name: [name] MRN: [id number] DOB: [birth year] Today's Date: 05/09/2022  History of Present Illness  [name], is a 87 y.o. female, who presented to the [facility] ED with a chief complaint of AMS. Pt found to have small SAH or R frontal operculum, L posterior scap hematoma without skull fracture. PMH: alzheimers disease, crohn disease, HTN, CAD s/p MI, TIA, pressure ulcer of left hip   Clinical Impression  Spoke extensively with son [name] regarding PLOF. Pt with alzheimers and poor cognition at baseline however pleasant, talkative, and cooperative. Pt requires maxA for all mobility and ADLs. Per son pt is typically able to feed herself with her L hand using a spoon otherwise dependent for all other ADLs. Son reports having [place] home health aides M-F 3-5hrs a day in addition to himself and his wife to provide 24/7 assist/care. Son feels comfortable taking patient home once medically stable. Pt is near baseline level of funtion. Acute PT to cont to follow pt to prevent further decline and to promote OOB mobility.        Recommendations for follow up therapy are one component of a multi-disciplinary discharge planning process, led by the attending physician.  Recommendations may be updated based on patient status, additional functional criteria and insurance authorization.  Follow Up Recommendations No PT follow up      Assistance Recommended at Discharge Frequent or constant Supervision/Assistance (son and his wife in addition to [facility] caregivers provide 24/7 care)  Patient can return home with the following  A lot of help with walking and/or transfers;A lot of help with bathing/dressing/bathroom;Assistance with feeding;Assistance with cooking/housework;Direct supervision/assist for medications management;Direct supervision/assist for financial management;Assist for transportation;Help with stairs or ramp for entrance    Equipment  Recommendations None recommended by PT (has needed DME)  Recommendations for Other Services       Functional Status Assessment Patient has had a recent decline in their functional status and demonstrates the ability to make significant improvements in function in a reasonable and predictable amount of time.     Precautions / Restrictions Precautions Precautions: Fall Precaution Comments: pt with alzheimers Restrictions Weight Bearing Restrictions: No      Mobility  Bed Mobility Overal bed mobility: Needs Assistance Bed Mobility: Rolling, Sidelying to Sit Rolling: Mod assist, Max assist Sidelying to sit: Max assist       General bed mobility comments: pt with some initiation with tactile cues, limited ability to physically help but not resistant, pt only about 75-80Lbs    Transfers Overall transfer level: Needs assistance Equipment used: 2 person hand held assist (face to face transfer) Transfers: Sit to/from Stand, Bed to chair/wheelchair/BSC Sit to Stand: Total assist, +2 physical assistance Stand pivot transfers: Total assist, +2 physical assistance         General transfer comment: RN and PT transfered pt to recliner, pt with no active WBing t/o legs or active assist, pt petite and light weight but is totalAx2 due to inability to assist    Ambulation/Gait               General Gait Details: non-ambulatory  Stairs            Wheelchair Mobility    Modified Rankin (Stroke Patients Only)       Balance Overall balance assessment: Needs assistance Sitting-balance support: Feet supported, No upper extremity supported Sitting balance-Leahy Scale: Fair Sitting balance - Comments: >30 sec pt with slow R, posteriorlateral lean requiring  assist to bring back to midline Postural control: Posterior lean, Right lateral lean Standing balance support: During functional activity, Bilateral upper extremity supported, Reliant on assistive device for  balance Standing balance-Leahy Scale: Zero Standing balance comment: dependent on external assist                             Pertinent Vitals/Pain Pain Assessment Pain Assessment: No/denies pain    Home Living Family/patient expects to be discharged to:: Private residence Living Arrangements: Children Available Help at Discharge: Family;Available 24 hours/day;Home health Alvis Lemmings, M-F 3-5 hrs, occasional weekends) Type of Home: House Home Access: Stairs to enter   [employer] of Steps: 1   Home Layout: One level Home Equipment: Shower seat;Wheelchair - manual (cushion in w/c, air mattress on regular bed) Additional Comments: history taken from son [name]    Prior Function Prior Level of Function : Needs assist             Mobility Comments: uses w/c primarily, son transfers her from w/c and recliner t/o day, only in bed to sleep ADLs Comments: requires assist from family and [place], mix of bed bath by [employer] and bath on shower seat by son, pt dependent for bathing, was feeding self with L hand majority of time     Hand Dominance   Dominant Hand: Left    Extremity/Trunk Assessment   Upper Extremity Assessment Upper Extremity Assessment: Defer to OT evaluation    Lower Extremity Assessment Lower Extremity Assessment:  (pt with bilat knee contractures, minimal active hip flexion and knee extension, per son this has been over the last 9 months)    Cervical / Trunk Assessment Cervical / Trunk Assessment: Kyphotic  Communication   Communication: Expressive difficulties (muffled speech)  Cognition Arousal/Alertness: Awake/alert Behavior During Therapy: WFL for tasks assessed/performed Overall Cognitive Status: History of cognitive impairments - at baseline                                 General Comments: pt pleasant, h/o alzheimers,        General Comments General comments (skin integrity, edema, etc.): chronic L hip ulcer,  covered by dressing, VSS on RA    Exercises     Assessment/Plan    PT Assessment Patient needs continued PT services  PT Problem List Decreased strength;Decreased range of motion;Decreased activity tolerance;Decreased balance;Decreased mobility;Decreased coordination;Decreased cognition;Decreased knowledge of use of DME;Decreased safety awareness       PT Treatment Interventions DME instruction;Gait training;Functional mobility training;Therapeutic activities;Therapeutic exercise;Balance training    PT Goals (Current goals can be found in the Care Plan section)  Acute Rehab PT Goals Patient Stated Goal: unable to state PT Goal Formulation: With family Time For Goal Achievement: 05/22/22 Potential to Achieve Goals: Good    Frequency Min 2X/week     Co-evaluation               AM-PAC PT "6 Clicks" Mobility  Outcome Measure Help needed turning from your back to your side while in a flat bed without using bedrails?: Total Help needed moving from lying on your back to sitting on the side of a flat bed without using bedrails?: Total Help needed moving to and from a bed to a chair (including a wheelchair)?: Total Help needed standing up from a chair using your arms (e.g., wheelchair or bedside chair)?: Total Help needed to walk in  hospital room?: Total Help needed climbing 3-5 steps with a railing? : Total 6 Click Score: 6    End of Session Equipment Utilized During Treatment: Gait belt Activity Tolerance: Patient tolerated treatment well Patient left: in chair;with call bell/phone within reach;with chair alarm set;with nursing/sitter in room Nurse Communication: Mobility status PT Visit Diagnosis: Unsteadiness on feet (R26.81);Muscle weakness (generalized) (M62.81);Difficulty in walking, not elsewhere classified (R26.2)    Time: 0240-9735 PT Time Calculation (min) (ACUTE ONLY): 24 min   Charges:   PT Evaluation $PT Eval Moderate Complexity: 1 Mod PT  Treatments $Therapeutic Activity: 8-22 mins        Kittie Plater, PT, DPT Acute Rehabilitation Services Secure chat preferred Office #: [phone]   Berline Lopes 05/09/2022, 8:34 AM

## 2022-05-09 NOTE — Progress Notes (Addendum)
[facility] PROGRESS NOTE    Progress Note  [name]  YSA:[id number] DOB: 1935/02/04 DOA: 04/27/2022 PCP: Charolette Forward, MD     Brief Narrative:   [name] is an 87 y.o. female past medical history of Alzheimer's, Crohn's disease, CAD status post MI, TIA left hip pressure ulcer found to be unresponsive drooling brought in by EMS CT was concerning for small subarachnoid hemorrhage and right frontal operculum, left posterior scalp hematoma without skull fracture admitted to PCCM.   Assessment/Plan:   Right frontal   SAH (subarachnoid hemorrhage) likely traumatic/with left posterior scalp hematoma no skull fracture/possible seizures: In the setting of a fall, CT showed results as below. We will start her on Keppra twice a day. EEG negative for seizures. Physical therapy evaluated the patient recommended home health PT. Awaiting Occupational Therapy evaluation. Not a candidate for Plavix.  Alzheimer's dementia: We will resume her home meds.  Central hypertension: Restart Coreg at a lower dose. Hold Lasix.  CAD: Continue Coreg not a candidate for Plavix.  Anemia of chronic disease/number cytopenia: No signs of bleeding cachectic. Her hemoglobin is relatively stable.  Elevated LFTs: INR normal are trending down.  Acute kidney injury on chronic kidney disease stage IIIb/Hypokalemia/hypomagnesemia: Likely due to poor oral intake in the setting of diuretic use. Resolved with fluid resuscitation and holding diuretic therapy creatinine trending down nicely. Unknown baseline but her  last creatinine in 2020 was 1.3.  Severe protein caloric malnutrition:  Decubitus ulcer of left hip stage II POA: RN Pressure Injury Documentation: Pressure Injury 05/07/22 Hip Left;Posterior;Mid stage 2 wound (Active)  05/07/22 0200  Location: Hip  Location Orientation: Left;Posterior;Mid  Staging:   Wound Description (Comments): stage 2 wound  Present on Admission: Yes   Dressing Type Other (Comment) 05/08/22 2215    DVT prophylaxis: scd Family Communication:none Status is: Inpatient Remains inpatient appropriate because: Intracranial hemorrhage awaiting OT evaluation    Code Status:     Code Status Orders  (From admission, onward)           Start     Ordered   05/07/22 0015  Full code  Continuous        05/07/22 0019           Code Status History     Date Active Date Inactive Code Status Order ID Comments User Context   12/04/2017 2331 12/05/2017 1546 Full Code [id number]  Dorie Rank, MD ED   11/28/2017 1310 11/30/2017 1614 Full Code [id number]  Charolette Forward, MD Inpatient   11/28/2017 1310 11/28/2017 1310 Full Code [id number]  Charolette Forward, MD Inpatient   09/26/2017 0459 09/26/2017 2212 Full Code [id number]  Palumbo, April, MD ED   08/30/2017 1311 08/31/2017 1327 Full Code [id number]  Lorelle Gibbs ED   02/02/2014 2031 02/04/2014 1534 Full Code [id number]  Charolette Forward, MD Inpatient         IV Access:   Peripheral IV   Procedures and diagnostic studies:   No results found.   Medical Consultants:   None.   Subjective:    TYLYN STANKOVICH no complaints  Objective:    Vitals:   05/09/22 0058 05/09/22 0445 05/09/22 0543 05/09/22 0803  BP: (!) 141/79 (!) 159/90  (!) 152/84  Pulse: 67 62  60  Resp:  18  19  Temp:  (!) 97.5 F (36.4 C)    TempSrc:  Axillary    SpO2:  95%  100%  Weight:   35.7 kg  SpO2: 100 %   Intake/Output Summary (Last 24 hours) at 05/09/2022 1033 Last data filed at 05/09/2022 0800 Gross per 24 hour  Intake 795 ml  Output 575 ml  Net 220 ml   Filed Weights   05/07/22 0155 05/08/22 0500 05/09/22 0543  Weight: 30.8 kg 30.4 kg 35.7 kg    Exam: General exam: In no acute distress. Respiratory system: Good air movement and clear to auscultation. Cardiovascular system: S1 & S2 heard, RRR. No JVD. Gastrointestinal system: Abdomen is nondistended, soft and nontender.  Extremities:  No pedal edema. Skin: No rashes, lesions or ulcers  Data Reviewed:    Labs: Basic Metabolic Panel: Recent Labs  Lab 04/15/2022 2207 05/07/22 0314 05/08/22 0855 05/09/22 0335  NA 138 139 136  --   K 4.2 4.0 5.0  --   CL 102 106 105  --   CO2 26 24 23   --   GLUCOSE 96 86 85  --   BUN 66* 63* 56*  --   CREATININE 2.16* 1.86* 1.67*  --   CALCIUM 9.1 8.4* 8.7*  --   MG  --  1.5* 2.1 2.1  PHOS  --   --  3.2  --    GFR CrCl cannot be calculated (Unknown ideal weight.). Liver Function Tests: Recent Labs  Lab 05/12/2022 2207 05/07/22 0314  AST 122* 104*  ALT 144* 122*  ALKPHOS 106 104  BILITOT 0.7 0.5  PROT 6.2* 5.7*  ALBUMIN 3.4* 2.9*   No results for input(s): "LIPASE", "AMYLASE" in the last 168 hours. No results for input(s): "AMMONIA" in the last 168 hours. Coagulation profile Recent Labs  Lab 05/07/22 0314  INR 1.2   COVID-19 Labs  No results for input(s): "DDIMER", "FERRITIN", "LDH", "CRP" in the last 72 hours.  Lab Results  Component Value Date   Flasher NEGATIVE 07/19/2019    CBC: Recent Labs  Lab 04/29/2022 2207 05/07/22 0314 05/08/22 0855  WBC 4.9 4.4 3.2*  NEUTROABS 3.8  --  2.1  HGB 9.7* 8.4* 8.4*  HCT 29.8* 25.7* 25.2*  MCV 99.0 97.7 96.9  PLT 109* 89* 88*   Cardiac Enzymes: No results for input(s): "CKTOTAL", "CKMB", "CKMBINDEX", "TROPONINI" in the last 168 hours. BNP (last 3 results) No results for input(s): "PROBNP" in the last 8760 hours. CBG: No results for input(s): "GLUCAP" in the last 168 hours. D-Dimer: No results for input(s): "DDIMER" in the last 72 hours. Hgb A1c: No results for input(s): "HGBA1C" in the last 72 hours. Lipid Profile: No results for input(s): "CHOL", "HDL", "LDLCALC", "TRIG", "CHOLHDL", "LDLDIRECT" in the last 72 hours. Thyroid function studies: No results for input(s): "TSH", "T4TOTAL", "T3FREE", "THYROIDAB" in the last 72 hours.  Invalid input(s): "FREET3" Anemia work up: No results for input(s):  "VITAMINB12", "FOLATE", "FERRITIN", "TIBC", "IRON", "RETICCTPCT" in the last 72 hours. Sepsis Labs: Recent Labs  Lab 05/12/2022 2207 05/07/22 0314 05/08/22 0855  WBC 4.9 4.4 3.2*   Microbiology Recent Results (from the past 240 hour(s))  Urine Culture     Status: None   Collection Time: 04/26/2022 10:27 PM   Specimen: In/Out Cath Urine  Result Value Ref Range Status   Specimen Description IN/OUT CATH URINE  Final   Special Requests NONE  Final   Culture   Final    NO GROWTH Performed at [facility], [address], [place], [place] [address]    Report Status 05/07/2022 FINAL  Final  MRSA Next Gen by PCR, Nasal  Status: None   Collection Time: 05/07/22  2:09 AM   Specimen: Nasal Mucosa; Nasal Swab  Result Value Ref Range Status   MRSA by PCR Next Gen NOT DETECTED NOT DETECTED Final    Comment: (NOTE) The GeneXpert MRSA Assay (FDA approved for NASAL specimens only), is one component of a comprehensive MRSA colonization surveillance program. It is not intended to diagnose MRSA infection nor to guide or monitor treatment for MRSA infections. Test performance is not FDA approved in patients less than 66 years old. Performed at [facility], [place] [address], [place], [place] [address]      Medications:    atorvastatin  20 mg Oral q1800   Chlorhexidine Gluconate Cloth  6 each Topical Daily   levETIRAcetam  250 mg Oral BID   multivitamin with minerals  0.5 tablet Oral Daily   mouth rinse  15 mL Mouth Rinse 4 times per day   Continuous Infusions:  lactated ringers 50 mL/hr at 05/09/22 1006      LOS: 2 days   Charlynne Cousins  [facility]  05/09/2022, 10:33 AM

## 2022-05-09 NOTE — TOC Initial Note (Signed)
Transition of Care [facility]) - Initial/Assessment Note    Patient Details  Name: [name] MRN: [id number] Date of Birth: [birth year]  Transition of Care [facility]) CM/SW Contact:    Pollie Friar, RN Phone Number: 05/09/2022, 1:57 PM  Clinical Narrative:                 CM met with the patient and her son (not the one she lives with) at the bedside. Per her son, patient has 24 hour care at home between his brother and caregivers. They have all needed DME.  No f/u per PT/OT.  Pt has transportation home when medically ready.  Expected Discharge Plan: Home/Self Care Barriers to Discharge: Continued Medical Work up   Patient Goals and CMS Choice        Expected Discharge Plan and Services Expected Discharge Plan: Home/Self Care   Discharge Planning Services: CM Consult   Living arrangements for the past 2 months: Single Family Home                                      Prior Living Arrangements/Services Living arrangements for the past 2 months: Single Family Home Lives with:: Adult Children Patient language and need for interpreter reviewed:: Yes        Need for Family Participation in Patient Care: Yes (Comment) Care giver support system in place?: Yes (comment) Current home services: DME (All needed) Criminal Activity/Legal Involvement Pertinent to Current Situation/Hospitalization: No - Comment as needed  Activities of Daily Living      Permission Sought/Granted                  Emotional Assessment Appearance:: Appears stated age     Orientation: : Oriented to Self   Psych Involvement: No (comment)  Admission diagnosis:  Intracranial hemorrhage ([place]) [I62.9] SAH (subarachnoid hemorrhage) ([place]) [I60.9] Patient Active Problem List   Diagnosis Date Noted   SAH (subarachnoid hemorrhage) ([place]) 05/07/2022   Acute renal failure (ARF) ([place]) 05/07/2022   Decubitus ulcer of left hip 05/07/2022   Late onset Alzheimer's disease with behavioral  disturbance ([place]) 01/27/2018   Acute MI, lateral wall ([place]) 11/28/2017   Brief psychotic disorder ([place]) 09/06/2017   Cognitive impairment 09/02/2017   Psychosis ([place]) 08/31/2017   Paranoid delusion ([place]) 06/26/2017   CKD (chronic kidney disease) stage 4, GFR 15-29 ml/min ([place]) 01/31/2017   Palpitations 08/29/2016   TIA (transient ischemic attack) 02/02/2014   PCP:  Charolette Forward, MD Pharmacy:   [place], [place], [address], [place] [address] Phone: [phone] Fax: [phone]  [id number] [id number]: [phone]: [phone]    Social Determinants of Health (SDOH) Interventions    Readmission Risk Interventions     No data to display

## 2022-05-09 NOTE — Progress Notes (Signed)
Patient require total assist with breakfast this AM with continuous cues and reminder. She ate about 80% of her meals consisted of all her oatmeal, 1/2 small sausage and 1/2 of a slice of toast. She drank approx 275m of liquid consisted of H2O and OJ.

## 2022-05-09 NOTE — Progress Notes (Addendum)
Patient noted with coughing and frequent throat clearing along with teary eyes with lunch. MD notified and SLP ordered.  Rn updated with family regarding issue. NPO at this time until further evaluate by SLP.

## 2022-05-09 NOTE — Evaluation (Signed)
Occupational Therapy Evaluation Patient Details Name: [name] MRN: [id number] DOB: 1934-10-23 Today's Date: 05/09/2022   History of Present Illness [name], is a 87 y.o. female, who presented to the [facility] ED with a chief complaint of AMS. Pt found to have small SAH or R frontal operculum, L posterior scap hematoma without skull fracture. PMH: alzheimers disease, crohn disease, HTN, CAD s/p MI, TIA, pressure ulcer of left hip   Clinical Impression   Patient admitted for above and presents with problem list below, including impaired cognition, weakness and decreased activity tolerance. At baseline, per son, pt needs assist with all ADLs except for feeding with a spoon and able to wash face at times with some assist using L hand.  Patient currently requires mod assist for self feeding and is baseline total assist for all other self care.  Questionable L sided visual deficits, but further assessment required (limited due to cognition- alzheimers at baseline). Will follow acutely to optimize return to PLOF in self feeding.  Anticipate no further needs after dc home.      Recommendations for follow up therapy are one component of a multi-disciplinary discharge planning process, led by the attending physician.  Recommendations may be updated based on patient status, additional functional criteria and insurance authorization.   Follow Up Recommendations  No OT follow up    Assistance Recommended at Discharge Frequent or constant Supervision/Assistance  Patient can return home with the following Two people to help with walking and/or transfers;A lot of help with bathing/dressing/bathroom;Assistance with feeding;Assistance with cooking/housework;Direct supervision/assist for medications management;Direct supervision/assist for financial management;Assist for transportation;Help with stairs or ramp for entrance    Functional Status Assessment  Patient has had a recent decline in their  functional status and demonstrates the ability to make significant improvements in function in a reasonable and predictable amount of time.  Equipment Recommendations  None recommended by OT    Recommendations for Other Services       Precautions / Restrictions Precautions Precautions: Fall Precaution Comments: pt with alzheimers Restrictions Weight Bearing Restrictions: No      Mobility Bed Mobility                    Transfers                          Balance                                           ADL either performed or assessed with clinical judgement   ADL Overall ADL's : Needs assistance/impaired Eating/Feeding: Moderate assistance;Sitting Eating/Feeding Details (indicate cue type and reason): assist required to scoop applesauce and hold spoon, once holding able to bring to mouth. Grooming: Maximal assistance;Sitting Grooming Details (indicate cue type and reason): to wash face, pt able to hold wash cloth but unsure what to do with it- attmepting to eat                               General ADL Comments: overall total assist for ADLs- baseline     Vision   Additional Comments: able to reach and give high five in all planes, increased cueing on L side comapred to R. continue assessment.     Perception     Praxis  Pertinent Vitals/Pain Pain Assessment Pain Assessment: No/denies pain     Hand Dominance Left   Extremity/Trunk Assessment Upper Extremity Assessment Upper Extremity Assessment: RUE deficits/detail;LUE deficits/detail RUE Deficits / Details: grossly weak, able to open hand with assist but preference into flexion- son reports baseline RUE Coordination: decreased gross motor;decreased fine motor LUE Deficits / Details: grossly 3-/5 MMT, able to use functionally during ADLs LUE Coordination: decreased gross motor;decreased fine motor   Lower Extremity Assessment Lower Extremity Assessment:  Defer to PT evaluation   Cervical / Trunk Assessment Cervical / Trunk Assessment: Kyphotic   Communication Communication Communication: Expressive difficulties (muffled speech)   Cognition Arousal/Alertness: Awake/alert Behavior During Therapy: WFL for tasks assessed/performed Overall Cognitive Status: History of cognitive impairments - at baseline                                 General Comments: pt pleasant, h/o alzheimers- able to follow 1 step commands but poor attention and distrated by manipulating all items around her (bed pad, lines, etc)     General Comments  son at side, supportive    Exercises     Shoulder Instructions      Home Living Family/patient expects to be discharged to:: Private residence Living Arrangements: Children Available Help at Discharge: Family;Available 24 hours/day;Home health [facility], M-F 3-5 hrs, occasional weekends) Type of Home: House Home Access: Stairs to enter [employer] of Steps: 1   Home Layout: One level     Bathroom Shower/Tub: Teacher, early years/pre: Standard     Home Equipment: Civil engineer, contracting;Wheelchair - manual (cushion in w/c, air mattress on regular bed)   Additional Comments: history taken from son [name]      Prior Functioning/Environment Prior Level of Function : Needs assist             Mobility Comments: uses w/c primarily, son transfers her from w/c and recliner t/o day, only in bed to sleep ADLs Comments: requires assist from family and [place], mix of bed bath by [employer] and bath on shower seat by son, pt dependent for bathing, was feeding self with L hand majority of time        OT Problem List: Decreased strength;Decreased activity tolerance;Impaired balance (sitting and/or standing);Decreased cognition;Decreased coordination;Impaired vision/perception;Impaired UE functional use      OT Treatment/Interventions: Self-care/ADL training;Therapeutic exercise;DME and/or  AE instruction;Therapeutic activities;Patient/family education;Balance training;Cognitive remediation/compensation    OT Goals(Current goals can be found in the care plan section) Acute Rehab OT Goals Patient Stated Goal: keep her as  independent as possible OT Goal Formulation: With patient Time For Goal Achievement: 05/23/22 Potential to Achieve Goals: Good  OT Frequency: Min 2X/week    Co-evaluation              AM-PAC OT "6 Clicks" Daily Activity     Outcome Measure Help from another person eating meals?: A Lot Help from another person taking care of personal grooming?: Total Help from another person toileting, which includes using toliet, bedpan, or urinal?: Total Help from another person bathing (including washing, rinsing, drying)?: Total Help from another person to put on and taking off regular upper body clothing?: Total Help from another person to put on and taking off regular lower body clothing?: Total 6 Click Score: 7   End of Session Nurse Communication: Mobility status  Activity Tolerance: Patient tolerated treatment well Patient left: in chair;with call bell/phone within reach;with family/visitor present;with  chair alarm set;with nursing/sitter in room  OT Visit Diagnosis: Other abnormalities of gait and mobility (R26.89);Muscle weakness (generalized) (M62.81);Other symptoms and signs involving cognitive function                Time: 6484-7207 OT Time Calculation (min): 16 min Charges:  OT General Charges $OT Visit: 1 Visit OT Evaluation $OT Eval Moderate Complexity: 1 Mod  Jolaine Artist, OT Acute Rehabilitation Services Office [phone]   Delight Stare 05/09/2022, 12:59 PM

## 2022-05-10 DIAGNOSIS — I609 Nontraumatic subarachnoid hemorrhage, unspecified: Secondary | ICD-10-CM | POA: Diagnosis not present

## 2022-05-10 DIAGNOSIS — L89222 Pressure ulcer of left hip, stage 2: Secondary | ICD-10-CM | POA: Diagnosis not present

## 2022-05-10 DIAGNOSIS — I629 Nontraumatic intracranial hemorrhage, unspecified: Secondary | ICD-10-CM | POA: Diagnosis not present

## 2022-05-10 DIAGNOSIS — N179 Acute kidney failure, unspecified: Secondary | ICD-10-CM | POA: Diagnosis not present

## 2022-05-10 MED ORDER — SODIUM CHLORIDE 0.9 % IV SOLN: INTRAVENOUS | Status: AC

## 2022-05-10 NOTE — Evaluation (Signed)
Clinical/Bedside Swallow Evaluation Patient Details  Name: [name] MRN: [id number] Date of Birth: [birth year]  Today's Date: 05/10/2022 Time: SLP Start Time (ACUTE ONLY): 39 SLP Stop Time (ACUTE ONLY): 1035 SLP Time Calculation (min) (ACUTE ONLY): 25 min  Past Medical History:  Past Medical History:  Diagnosis Date   Acute lateral wall myocardial infarction ([place]) 11/28/2017   Alzheimer disease ([place])    Crohn disease ([place])    History of kidney stones    Hypertension    TIA (transient ischemic attack) 01/2014   hx/notes 02/06/2014   Past Surgical History:  Past Surgical History:  Procedure Laterality Date   CARDIAC CATHETERIZATION  11/28/2017   COLECTOMY     DILATION AND CURETTAGE OF UTERUS     ESOPHAGOGASTRODUODENOSCOPY  10/2008   Archie Endo 02/14/2011   LAPAROSCOPIC RIGHT HEMI COLECTOMY  1970s   /notes 5/2/012   LEFT HEART CATH AND CORONARY ANGIOGRAPHY N/A 11/28/2017   Procedure: LEFT HEART CATH AND CORONARY ANGIOGRAPHY;  Surgeon: Charolette Forward, MD;  Location: [facility];  Service: Cardiovascular;  Laterality: N/A;   TONSILLECTOMY     HPI:  [name], is a 87 y.o. female, who presented to the [facility] ED with a chief complaint of AMS. Pt found to have small SAH or R frontal operculum, L posterior scap hematoma without skull fracture. PMH: alzheimers disease, crohn disease, HTN, CAD s/p MI, TIA, pressure ulcer of left hip. In 2008 pt dx with esophageal dysmotility on esophagram. Swallow otherwise normal.    Assessment / Plan / Recommendation  Clinical Impression  Pt demonstrates inability to manage secretions at baseline. She is alert, but with wet respirations and congested weak cough. Suction to base of tongue retrieves tan secretions with almost immediate return to wet respirations. Pt is alert and able to follow one step commands. She accepts one ice chip and three teaspoons of puree. Pt with worsening wet vocal quality after these trials. There are multiple  swallows with wet swishing sound. SLP again suctioned pt and removes puree. Suspect a multifactoral dysphagia, likely present at baseline but now worsened due to acute weakness. Pts head and neck position would suggest a potential cervical esophageal dysphagia. Pt also with a history of esophageal dysmotility from 15 years ago, which has likely progressed. Instrumental testing not needed to determine that pt has a high risk of aspiration with any PO given. Discussed this at length with MD and pts son. Would advise feeding pt for comfort with known risk, puree and nectar thick liquids might be easiest. Will f/u for further decisions on care and to assist in feeding as appropriate. SLP Visit Diagnosis: Dysphagia, oropharyngeal phase (R13.12)    Aspiration Risk  Risk for inadequate nutrition/hydration;Severe aspiration risk    Diet Recommendation Dysphagia 1 (Puree);Nectar-thick liquid   Liquid Administration via: Cup;Straw Medication Administration: Crushed with puree Supervision: Full supervision/cueing for compensatory strategies Compensations: Slow rate;Small sips/bites Postural Changes: Seated upright at 90 degrees    Other  Recommendations Oral Care Recommendations: Oral care BID    Recommendations for follow up therapy are one component of a multi-disciplinary discharge planning process, led by the attending physician.  Recommendations may be updated based on patient status, additional functional criteria and insurance authorization.  Follow up Recommendations No SLP follow up      Assistance Recommended at Discharge    Functional Status Assessment    Frequency and Duration min 2x/week  2 weeks       Prognosis  Swallow Study   General HPI: [name], is a 87 y.o. female, who presented to the [facility] ED with a chief complaint of AMS. Pt found to have small SAH or R frontal operculum, L posterior scap hematoma without skull fracture. PMH: alzheimers disease, crohn disease,  HTN, CAD s/p MI, TIA, pressure ulcer of left hip. In 2008 pt dx with esophageal dysmotility on esophagram. Swallow otherwise normal. Type of Study: Bedside Swallow Evaluation Previous Swallow Assessment: none Diet Prior to this Study: NPO Temperature Spikes Noted: No Respiratory Status: Room air History of Recent Intubation: No Behavior/Cognition: Alert;Requires cueing Oral Cavity Assessment: Within Functional Limits Oral Care Completed by SLP: No Oral Cavity - Dentition: Adequate natural dentition Self-Feeding Abilities: Total assist Patient Positioning: Upright in bed Baseline Vocal Quality: Wet Volitional Cough: Weak;Congested Volitional Swallow: Unable to elicit    Oral/Motor/Sensory Function Overall Oral Motor/Sensory Function: Generalized oral weakness   Ice Chips Ice chips: Impaired Presentation: Spoon Pharyngeal Phase Impairments: Multiple swallows;Wet Vocal Quality   Thin Liquid Thin Liquid: Not tested    Nectar Thick Nectar Thick Liquid: Not tested   Honey Thick Honey Thick Liquid: Not tested   Puree Puree: Impaired Presentation: Spoon Pharyngeal Phase Impairments: Multiple swallows;Wet Vocal Quality;Cough - Immediate;Cough - Delayed   Solid     Solid: Not tested      Dakota Vanwart, Katherene Ponto 05/10/2022,1:11 PM

## 2022-05-10 NOTE — Progress Notes (Signed)
PT Cancellation Note  Patient Details Name: [name] MRN: [id number] DOB: [birth year]   Cancelled Treatment:    Reason Eval/Treat Not Completed: (P) Fatigue/lethargy limiting ability to participate (Pt wishing to rest, speech is wet and garbled.  Will f/u per POC.)   Mike Hamre Eli Hose 05/10/2022, 2:13 PM  Erasmo Leventhal , PTA Acute Rehabilitation Services Office [phone]

## 2022-05-10 NOTE — Progress Notes (Signed)
Patient ME:BRAXE AISSATA WILMORE      DOB: May 24, 1935      NMM:[id number]      Palliative Medicine Team    Subjective: Bedside visit, two sons Sonia Side and [name] bedside for conversation with palliative RN.    Physical exam: Patient resting in bed with eyes closed at time of visit. Breathing even, slightly labored, audible secretions noted during visit. Patient without physical or non-verbal signs of pain or discomfort at this time. Patient able to open eyes and briefly hold eye contact, not participatory in conversations at this time.    Discussion: This RN introduced palliative care to sons as specialty care for patients with serious illness or diagnosis. The difference between palliative and hospice care was explained.  [name] explained that prior to this admission, the patient lived in his home with him and his wife, and was provided 3ish hours of home health nursing five days a week. Patient was able to feed self and engage meaningfully at baseline. Both sons endorse rapidly progressing deterioration in their mother, specifically pertaining to her swallow function and independence with some ADLs. This RN provided active listening to concerns and changes noted in the patient. Both sons agree that prior conversations with their mother lead them to believe she would never want long term artifical support or feeding/hydration. That she would want a comfort focused approach when nearing end of life, also focusing on quality of remaining time rather than quantity. This RN discussed with them what the NPO order meant for their mother and that for her safety she was not receiving previous mediations and nutrition/hydration by mouth. They were understanding of this. This RN discussed the consequence of not being able to achieve nutrition/hydration by mouth and the potential need for artifical delivery options or hospice/comfort instead. Discussed the risk of aspiration despite feeding tube placement and that  due to the patient's obvious difficulty managing oral secretions, the likelihood of aspiration, pneumonia, and recurrent hospitalizations for antibiotics was high. The philosophy of hospice was briefly introduced, family to discuss tonight.  Provided Hard Choices book and MOST form to guide conversation between sons prior to follow up meeting planned for 7/28. This RN also discussed code status with family, both sons in immediate agreement with full DNR in affect immediately. This information was shared with bedside RN, attending MD, and PMT NP, code status changed to DNR to reflect wishes.    Assessment and plan: This RN will plan to revisit tomorrow and provide support however best. Will continue to follow for any changes or advances. PMT phone number provided to help coordinate meeting time in late morning 7/28.    Thank you for allowing the Palliative Medicine Team to assist in the care of this patient.     Damian Leavell, MSN, RN Palliative Medicine Team Team Phone: [phone]  This phone is monitored 7a-7p, please reach out to attending physician outside of these hours for urgent needs.

## 2022-05-10 NOTE — Progress Notes (Signed)
[facility] PROGRESS NOTE    Progress Note  [name]  ACZ:[id number] DOB: August 10, 1935 DOA: 04/25/2022 PCP: Charolette Forward, MD     Brief Narrative:   [name] is an 87 y.o. female past medical history of Alzheimer's, Crohn's disease, CAD status post MI, TIA left hip pressure ulcer found to be unresponsive drooling brought in by EMS CT was concerning for small subarachnoid hemorrhage and right frontal operculum, left posterior scalp hematoma without skull fracture admitted to PCCM.   Assessment/Plan:   Right frontal   SAH (subarachnoid hemorrhage) likely traumatic/with left posterior scalp hematoma no skull fracture/possible seizures: In the setting of a fall, CT showed results as below. We will start her on Keppra twice a day. EEG negative for seizures. Physical therapy evaluated the patient recommended home health PT. Awaiting Occupational Therapy evaluation. Not a candidate for Plavix.  Dysphagia: As she was having trouble with swallowing speech was evaluated and deemed her very high risk of aspirating. I have explained to her son the poor prognosis. I have tried to call the other son to explain the complications and deteriorating condition of her mother.  But have been unsuccessful. We will get palliative care involved to discuss end-of-life.  Alzheimer's dementia: We will resume her home meds.  Essential hypertension: Hold antihypertensive medication her blood pressure seems to be well controlled. Continue IV as needed as needed.  CAD: Hold Coreg.  Anemia of chronic disease/thrombocytopenia/leukopenia No signs of bleeding cachectic. Her hemoglobin is relatively stable.  Elevated LFTs: INR normal are trending down.  Acute kidney injury on chronic kidney disease stage IIIb/Hypokalemia/hypomagnesemia: Likely due to poor oral intake in the setting of diuretic use. Resolved with fluid resuscitation and holding diuretic therapy creatinine trending down  nicely. Unknown baseline but her  last creatinine in 2020 was 1.3.  Severe protein caloric malnutrition:  Goals of care: I had a brief conversation with the the son will try to reach out to her other son which is a caregiver and were unsuccessful. We have agreed to consult palliative care as the patient is at very high risk of aspirating as a poor prognosis overall.  Decubitus ulcer of left hip stage II POA: RN Pressure Injury Documentation: Pressure Injury 05/07/22 Hip Left;Posterior;Mid stage 2 wound (Active)  05/07/22 0200  Location: Hip  Location Orientation: Left;Posterior;Mid  Staging:   Wound Description (Comments): stage 2 wound  Present on Admission: Yes  Dressing Type Other (Comment) 05/08/22 2215    DVT prophylaxis: scd Family Communication:none Status is: Inpatient Remains inpatient appropriate because: Intracranial hemorrhage awaiting OT evaluation    Code Status:     Code Status Orders  (From admission, onward)           Start     Ordered   05/07/22 0015  Full code  Continuous        05/07/22 0019           Code Status History     Date Active Date Inactive Code Status Order ID Comments User Context   12/04/2017 2331 12/05/2017 1546 Full Code [id number]  Dorie Rank, MD ED   11/28/2017 1310 11/30/2017 1614 Full Code [id number]  Charolette Forward, MD Inpatient   11/28/2017 1310 11/28/2017 1310 Full Code [id number]  Charolette Forward, MD Inpatient   09/26/2017 0459 09/26/2017 2212 Full Code [id number]  [place], April, MD ED   08/30/2017 1311 08/31/2017 1327 Full Code [id number]  Lorelle Gibbs ED   02/02/2014 2031 02/04/2014 1534 Full Code [id number]  Charolette Forward, MD Inpatient         IV Access:   Peripheral IV   Procedures and diagnostic studies:   No results found.   Medical Consultants:   None.   Subjective:    [name] nonverbal this morning  Objective:    Vitals:   05/09/22 2014 05/10/22 0014 05/10/22 0333 05/10/22  0833  BP: (!) 150/81 (!) 161/81 129/76 139/63  Pulse: (!) 58 (!) 56 72 66  Resp: 17 20 18 16   Temp: 97.7 F (36.5 C) 98 F (36.7 C) 97.7 F (36.5 C) 98.2 F (36.8 C)  TempSrc:    Oral  SpO2: 99% 97% 100% 92%  Weight:       SpO2: 92 %   Intake/Output Summary (Last 24 hours) at 05/10/2022 1031 Last data filed at 05/10/2022 0437 Gross per 24 hour  Intake 1475.88 ml  Output 350 ml  Net 1125.88 ml    Filed Weights   05/07/22 0155 05/08/22 0500 05/09/22 0543  Weight: 30.8 kg 30.4 kg 35.7 kg    Exam: General exam: In no acute distress, cachectic Respiratory system: Good air movement and clear to auscultation. Cardiovascular system: S1 & S2 heard, RRR. No JVD. Gastrointestinal system: Abdomen is nondistended, soft and nontender.  Extremities: No pedal edema. Skin: No rashes, lesions or ulcers  Data Reviewed:    Labs: Basic Metabolic Panel: Recent Labs  Lab 04/19/2022 2207 05/07/22 0314 05/08/22 0855 05/09/22 0335  NA 138 139 136  --   K 4.2 4.0 5.0  --   CL 102 106 105  --   CO2 26 24 23   --   GLUCOSE 96 86 85  --   BUN 66* 63* 56*  --   CREATININE 2.16* 1.86* 1.67*  --   CALCIUM 9.1 8.4* 8.7*  --   MG  --  1.5* 2.1 2.1  PHOS  --   --  3.2  --     GFR CrCl cannot be calculated (Unknown ideal weight.). Liver Function Tests: Recent Labs  Lab 05/14/2022 2207 05/07/22 0314  AST 122* 104*  ALT 144* 122*  ALKPHOS 106 104  BILITOT 0.7 0.5  PROT 6.2* 5.7*  ALBUMIN 3.4* 2.9*    No results for input(s): "LIPASE", "AMYLASE" in the last 168 hours. No results for input(s): "AMMONIA" in the last 168 hours. Coagulation profile Recent Labs  Lab 05/07/22 0314  INR 1.2    COVID-19 Labs  No results for input(s): "DDIMER", "FERRITIN", "LDH", "CRP" in the last 72 hours.  Lab Results  Component Value Date   [place] NEGATIVE 07/19/2019    CBC: Recent Labs  Lab 04/27/2022 2207 05/07/22 0314 05/08/22 0855  WBC 4.9 4.4 3.2*  NEUTROABS 3.8  --  2.1  HGB  9.7* 8.4* 8.4*  HCT 29.8* 25.7* 25.2*  MCV 99.0 97.7 96.9  PLT 109* 89* 88*    Cardiac Enzymes: No results for input(s): "CKTOTAL", "CKMB", "CKMBINDEX", "TROPONINI" in the last 168 hours. BNP (last 3 results) No results for input(s): "PROBNP" in the last 8760 hours. CBG: No results for input(s): "GLUCAP" in the last 168 hours. D-Dimer: No results for input(s): "DDIMER" in the last 72 hours. Hgb A1c: No results for input(s): "HGBA1C" in the last 72 hours. Lipid Profile: No results for input(s): "CHOL", "HDL", "LDLCALC", "TRIG", "CHOLHDL", "LDLDIRECT" in the last 72 hours. Thyroid function studies: No results for input(s): "TSH", "T4TOTAL", "T3FREE", "THYROIDAB" in the last 72 hours.  Invalid input(s): "FREET3" Anemia work up:  No results for input(s): "VITAMINB12", "FOLATE", "FERRITIN", "TIBC", "IRON", "RETICCTPCT" in the last 72 hours. Sepsis Labs: Recent Labs  Lab 05/01/2022 2207 05/07/22 0314 05/08/22 0855  WBC 4.9 4.4 3.2*    Microbiology Recent Results (from the past 240 hour(s))  Urine Culture     Status: None   Collection Time: 04/16/2022 10:27 PM   Specimen: In/Out Cath Urine  Result Value Ref Range Status   Specimen Description IN/OUT CATH URINE  Final   Special Requests NONE  Final   Culture   Final    NO GROWTH Performed at [facility], [address], [place], [place] [address]    Report Status 05/07/2022 FINAL  Final  MRSA Next Gen by PCR, Nasal     Status: None   Collection Time: 05/07/22  2:09 AM   Specimen: Nasal Mucosa; Nasal Swab  Result Value Ref Range Status   MRSA by PCR Next Gen NOT DETECTED NOT DETECTED Final    Comment: (NOTE) The GeneXpert MRSA Assay (FDA approved for NASAL specimens only), is one component of a comprehensive MRSA colonization surveillance program. It is not intended to diagnose MRSA infection nor to guide or monitor treatment for MRSA infections. Test performance is not FDA approved in patients less than 55  years old. Performed at [facility], [place] [address] [place], [place] [address]      Medications:    ARIPiprazole  5 mg Oral Daily   atorvastatin  20 mg Oral q1800   Chlorhexidine Gluconate Cloth  6 each Topical Daily   hydrALAZINE  10 mg Oral Q8H   levETIRAcetam  250 mg Oral BID   multivitamin with minerals  0.5 tablet Oral Daily   mouth rinse  15 mL Mouth Rinse 4 times per day   pantoprazole  40 mg Oral Daily   QUEtiapine  12.5 mg Oral QHS   Continuous Infusions:  lactated ringers 50 mL/hr at 05/10/22 0437      LOS: 3 days   Charlynne Cousins  [facility]  05/10/2022, 10:31 AM

## 2022-05-11 DIAGNOSIS — N184 Chronic kidney disease, stage 4 (severe): Secondary | ICD-10-CM

## 2022-05-11 DIAGNOSIS — R1310 Dysphagia, unspecified: Secondary | ICD-10-CM

## 2022-05-11 DIAGNOSIS — N179 Acute kidney failure, unspecified: Secondary | ICD-10-CM | POA: Diagnosis not present

## 2022-05-11 DIAGNOSIS — Z515 Encounter for palliative care: Secondary | ICD-10-CM | POA: Diagnosis not present

## 2022-05-11 DIAGNOSIS — I609 Nontraumatic subarachnoid hemorrhage, unspecified: Secondary | ICD-10-CM | POA: Diagnosis not present

## 2022-05-11 DIAGNOSIS — D638 Anemia in other chronic diseases classified elsewhere: Secondary | ICD-10-CM

## 2022-05-11 DIAGNOSIS — Z66 Do not resuscitate: Secondary | ICD-10-CM

## 2022-05-11 DIAGNOSIS — I629 Nontraumatic intracranial hemorrhage, unspecified: Secondary | ICD-10-CM | POA: Diagnosis not present

## 2022-05-11 DIAGNOSIS — D696 Thrombocytopenia, unspecified: Secondary | ICD-10-CM

## 2022-05-11 DIAGNOSIS — E46 Unspecified protein-calorie malnutrition: Secondary | ICD-10-CM

## 2022-05-11 DIAGNOSIS — Z7189 Other specified counseling: Secondary | ICD-10-CM

## 2022-05-11 DIAGNOSIS — R7989 Other specified abnormal findings of blood chemistry: Secondary | ICD-10-CM

## 2022-05-11 DIAGNOSIS — E43 Unspecified severe protein-calorie malnutrition: Secondary | ICD-10-CM

## 2022-05-15 NOTE — Care Management Important Message (Signed)
Important Message  Patient Details  Name: [name] MRN: [id number] Date of Birth: 07-29-1935   Medicare Important Message Given:    CORRECTION PATIENT DECEASED.   [name] 06/01/2022, 3:53 PM

## 2022-05-15 NOTE — Progress Notes (Signed)
Speech Language Pathology Treatment: Dysphagia  Patient Details Name: [name] MRN: [id number] DOB: 18-Jun-1935 Today's Date: 2022/05/23 Time: 8916-9450 SLP Time Calculation (min) (ACUTE ONLY): 20 min  Assessment / Plan / Recommendation Clinical Impression  [name] was seen for dysphagia treatment. She was alert; amenable to having her face washed and mouth cleaned.  She was repositioned in the bed with help from [facility], Palliative Care. [name] accepted ice chips, sips of water from a spoon, all of which led to multiple sub-swallows per bolus, audible congestion in throat, and intermittent wet, weak, nonproductive coughing.  She did not allow suctioning and tended to clamp down on [place] with her teeth. Likelihood of aspiration remains great. SLP note yesterday indicates that after discussion with pt's son and MD, a pureed diet with nectar-thick liquids was recommended.  Palliative Care met with pt's son today - plan is to continue IV fluids for now and reassess after 24-48 hours.  Recommend offering careful hand-feeding when pt is alert and participatory; if pt shows discomfort, hold POs. SLP will follow along.    HPI HPI: [name], is a 87 y.o. female, who presented to the [facility] ED with a chief complaint of AMS. Pt found to have small SAH or R frontal operculum, L posterior scap hematoma without skull fracture. PMH: alzheimers disease, crohn disease, HTN, CAD s/p MI, TIA, pressure ulcer of left hip. In 2008 pt dx with esophageal dysmotility on esophagram. Swallow otherwise normal.      SLP Plan  Continue with current plan of care      Recommendations for follow up therapy are one component of a multi-disciplinary discharge planning process, led by the attending physician.  Recommendations may be updated based on patient status, additional functional criteria and insurance authorization.    Recommendations  Diet recommendations: dysphagia 1, nectar thick  liquidsd Liquids provided via: Teaspoon Medication Administration: Crushed with puree Supervision: Trained caregiver to feed patient Compensations: Slow rate;Small sips/bites Postural Changes and/or Swallow Maneuvers: Seated upright 90 degrees                Oral Care Recommendations: Oral care BID Follow Up Recommendations: No SLP follow up Assistance recommended at discharge: Frequent or constant Supervision/Assistance SLP Visit Diagnosis: Dysphagia, oropharyngeal phase (R13.12) Plan: Continue with current plan of care         [name] L. Tivis Ringer, MA CCC/SLP Clinical Specialist - Acute Care SLP Acute Rehabilitation Services Office number [phone]   [name] [name]  2022/05/23, 1:34 PM

## 2022-05-15 NOTE — Consult Note (Signed)
Consultation Note Date: 05-21-2022   Patient Name: [name]  DOB: 31-Jul-1935  MRN: [id number]  Age / Sex: 87 y.o., female  PCP: Charolette Forward, MD Referring Physician: Aileen Fass, Tammi Klippel, MD  Reason for Consultation: Establishing goals of care  HPI/Patient Profile: 87 y.o. female  with past medical history of Alzheimer's, Crohn's disease, CAD status post MI, TIA, and left hip pressure ulcer admitted on 04/30/2022 with AMS.  Head CT concerning for small subarachnoid hemorrhage.  Also concern for seizures and patient was started on Keppra.  Patient is seen by speech therapy and deemed very high risk of aspiration.  PMT consulted to discuss goals of care.  Clinical Assessment and Goals of Care: I have reviewed medical records including EPIC notes, labs and imaging, received report from RN, assessed the patient and then spoke with patient's son [name] to discuss diagnosis prognosis, [place], EOL wishes, disposition and options.  I introduced Palliative Medicine as specialized medical care for people living with serious illness. It focuses on providing relief from the symptoms and stress of a serious illness. The goal is to improve quality of life for both the patient and the family.  Patient lives with her son [name] and his wife.  They also have caregivers in the home.  Prior to admission patient was able to feed herself.  She was alert and interactive.   We discussed patient's current illness and what it means in the larger context of patient's on-going co-morbidities.  Natural disease trajectory and expectations at EOL were discussed.  We discussed patient's decline over the past several days.  We discussed concern about her p.o. intake and dysphagia.  Son seems to understand poor prognosis.  I attempted to elicit values and goals of care important to the patient.  Both sons agree patient would not be interested in aggressive medical  interventions.  The difference between aggressive medical intervention and comfort care was considered in light of the patient's goals of care.  Son shares that they are leaning towards transitioning her care to more of a "palliative" approach however they are not quite ready for full comfort measures at this point -they would like to continue IV fluids for now (24-48 hrs) and then have further discussions about how to continue with care.  DNR established 7/27.  Discussed with son the importance of continued conversation with family and the medical providers regarding overall plan of care and treatment options, ensuring decisions are within the context of the patients values and GOCs.    Questions and concerns were addressed. The family was encouraged to call with questions or concerns.  Primary Decision Maker NEXT OF KIN - sons together    SUMMARY OF RECOMMENDATIONS   - family leaning towards more of a "palliative" approach but they request at least 24-48 hrs more of IV fluids before making a decision about transitioning to comfort measures - code status DNR - PMT will follow  Code Status/Advance Care Planning: DNR      Primary Diagnoses: Present on Admission:  Acute renal failure (ARF) ([place])  Decubitus ulcer of left hip  CKD (chronic kidney disease) stage 4, GFR 15-29 ml/min ([place])   I have reviewed the medical record, interviewed the patient and family, and examined the patient. The following aspects are pertinent.  Past Medical History:  Diagnosis Date   Acute lateral wall myocardial infarction ([place]) 11/28/2017   Alzheimer disease ([place])    Crohn disease ([place])    History of kidney stones  Hypertension    TIA (transient ischemic attack) 01/2014   hx/notes 02/06/2014   Social History   Socioeconomic History   Marital status: Married    Spouse name: Not on file   Number of children: 2   Years of education: Not on file   Highest education level: Master's degree  (e.g., MA, MS, MEng, MEd, MSW, MBA)  Occupational History   Not on file  Tobacco Use   Smoking status: Never   Smokeless tobacco: Never  Vaping Use   Vaping Use: Never used  Substance and Sexual Activity   Alcohol use: No   Drug use: No   Sexual activity: Never  Other Topics Concern   Not on file  Social History Narrative   Pt lives in 1 story home with her husband   Has 2 adult sons   Masters degree in education   Retired Tourist information centre manager    Right handed    Social Determinants of Health   Financial Resource Strain: Not on file  Food Insecurity: Not on file  Transportation Needs: Not on file  Physical Activity: Not on file  Stress: Not on file  Social Connections: Not on file   History reviewed. No pertinent family history. Scheduled Meds:  ARIPiprazole  5 mg Oral Daily   Chlorhexidine Gluconate Cloth  6 each Topical Daily   levETIRAcetam  250 mg Oral BID   mouth rinse  15 mL Mouth Rinse 4 times per day   QUEtiapine  12.5 mg Oral QHS   Continuous Infusions: PRN Meds:.docusate sodium, hydrALAZINE, ondansetron (ZOFRAN) IV, mouth rinse, polyethylene glycol, polyvinyl alcohol Allergies  Allergen Reactions   Sulfa Antibiotics Anaphylaxis and Rash   Chocolate Diarrhea and Nausea Only   Codeine Nausea And Vomiting   Eggs Or Egg-Derived Products Diarrhea and Nausea Only   Lactose Intolerance (Gi) Diarrhea    GI upset   Other Diarrhea and Nausea Only    Pasta- GI upset   Peanut (Diagnostic)    Review of Systems  Unable to perform ROS: Mental status change    Physical Exam Constitutional:      General: She is not in acute distress.    Appearance: She is ill-appearing.     Comments: Lethargic - opens eyes to physical stimulation  Pulmonary:     Effort: Pulmonary effort is normal.  Skin:    General: Skin is warm and dry.  Neurological:     Mental Status: She is disoriented.     Vital Signs: BP (!) 159/82 (BP Location: Left Arm)   Pulse 85   Temp (!) 97.5 F (36.4  C) (Axillary)   Resp 17   Wt 35.7 kg   SpO2 100%   BMI 13.94 kg/m  Pain Scale: Faces   Pain Score: 0-No pain   SpO2: SpO2: 100 % O2 Device:SpO2: 100 % O2 Flow Rate: .   IO: Intake/output summary:  Intake/Output Summary (Last 24 hours) at 2022/06/06 1314 Last data filed at 06/06/2022 0600 Gross per 24 hour  Intake 1387.41 ml  Output --  Net 1387.41 ml    LBM: Last BM Date : 05/10/22 Baseline Weight: Weight: 30.8 kg Most recent weight: Weight: 35.7 kg     Palliative Assessment/Data: PPS 10%     *Please note that this is a verbal dictation therefore any spelling or grammatical errors are due to the "[place] [employer]" system interpretation.  Juel Burrow, DNP, AGNP-C Palliative Medicine Team [phone] Pager: [phone]

## 2022-05-15 NOTE — Progress Notes (Signed)
OT Cancellation Note  Patient Details Name: [name] MRN: [id number] DOB: 16-Apr-1935   Cancelled Treatment:    Reason Eval/Treat Not Completed: Other (comment).  Note pt.s family to meet with palliative today to determine plan of care for pt.  Will hold for today and await results/plan from this meeting to determine our role in her care moving forward.    Clearnce Sorrel Lor[place]-COT/AL May 20, 2022, 10:40 AM

## 2022-05-15 NOTE — Progress Notes (Addendum)
[facility] PROGRESS NOTE    Progress Note  SOLARIS KRAM  XBJ:[id number] DOB: 1935-04-26 DOA: 05/05/2022 PCP: Charolette Forward, MD     Brief Narrative:   [name] is an 87 y.o. female past medical history of Alzheimer's, Crohn's disease, CAD status post MI, TIA left hip pressure ulcer found to be unresponsive drooling brought in by EMS CT was concerning for small subarachnoid hemorrhage and right frontal operculum, left posterior scalp hematoma without skull fracture admitted to PCCM.   Assessment/Plan:   Right frontal   SAH (subarachnoid hemorrhage) likely traumatic/with left posterior scalp hematoma no skull fracture/possible seizures: In the setting of a fall, CT showed results as below. Cont. Keppra twice a day. EEG negative for seizures. Physical therapy evaluated the patient recommended home health PT. Awaiting Occupational Therapy evaluation. Not a candidate for Plavix. After explaining to the family of her severe dysphagia and the risk of aspiration the family decided to discuss with palliative care goals of care. We will have a meeting today.  Dysphagia: As she was having trouble with swallowing speech was evaluated and deemed her very high risk of aspirating. I have explained to her son the poor prognosis. I have tried to call the other son to explain the complications and deteriorating condition of her mother.   Palliative care to meet with family to discuss end-of-life.  Alzheimer's dementia: Continue current home regimen  Essential hypertension: Hold antihypertensive medication her blood pressure seems to be  controlled. Continue IV as needed as needed.  CAD: Hold Coreg.  Pancytopenia/Anemia of chronic disease/thrombocytopenia/leukopenia No signs of bleeding cachectic. Her hemoglobin is relatively stable.  Elevated LFTs: INR normal are trending down.  Acute kidney injury on chronic kidney disease stage IIIb/Hypokalemia/hypomagnesemia: Likely  due to poor oral intake in the setting of diuretic use. Resolved with fluid resuscitation and holding diuretic therapy creatinine trending down nicely. Unknown baseline but her  last creatinine in 2020 was 1.3.  Severe protein caloric malnutrition: Noted.  Goals of care: I had a brief conversation with the the son will try to reach out to her other son which is a caregiver and were unsuccessful. Day have agreed to talk to  palliative care as the patient is at very high risk of aspirating as a poor prognosis overall. The patient will likely benefit moving to comfort measures.  Decubitus ulcer of left hip stage II POA: RN Pressure Injury Documentation: Pressure Injury 05/07/22 Hip Left;Posterior;Mid stage 2 wound (Active)  05/07/22 0200  Location: Hip  Location Orientation: Left;Posterior;Mid  Staging:   Wound Description (Comments): stage 2 wound  Present on Admission: Yes  Dressing Type Foam - Lift dressing to assess site every shift 05/10/22 1945    DVT prophylaxis: scd Family Communication:none Status is: Inpatient Remains inpatient appropriate because: Intracranial hemorrhage awaiting OT evaluation    Code Status:     Code Status Orders  (From admission, onward)           Start     Ordered   05/07/22 0015  Full code  Continuous        05/07/22 0019           Code Status History     Date Active Date Inactive Code Status Order ID Comments User Context   12/04/2017 2331 12/05/2017 1546 Full Code [id number]  Dorie Rank, MD ED   11/28/2017 1310 11/30/2017 1614 Full Code [id number]  Charolette Forward, MD Inpatient   11/28/2017 1310 11/28/2017 1310 Full Code [id number]  Harwani,  Prudencio Burly, MD Inpatient   09/26/2017 0459 09/26/2017 2212 Full Code [id number]  Palumbo, April, MD ED   08/30/2017 1311 08/31/2017 1327 Full Code [id number]  Lorelle Gibbs ED   02/02/2014 2031 02/04/2014 1534 Full Code [id number]  Charolette Forward, MD Inpatient         IV Access:   Peripheral  IV   Procedures and diagnostic studies:   No results found.   Medical Consultants:   None.   Subjective:    KATHLEAN CINCO nonverbal this morning  Objective:    Vitals:   05/10/22 1945 06/09/22 0010 06-09-2022 0342 06-09-22 0852  BP: (!) 145/93 (!) 159/79 (!) 164/80 (!) 159/82  Pulse:  82 82 85  Resp: 20 20 20 17   Temp: 98 F (36.7 C) 97.8 F (36.6 C) 97.7 F (36.5 C) (!) 97.5 F (36.4 C)  TempSrc: Oral Axillary Axillary Axillary  SpO2: 100% 98% 97% 100%  Weight:       SpO2: 100 %   Intake/Output Summary (Last 24 hours) at Jun 09, 2022 0904 Last data filed at 06/09/2022 0600 Gross per 24 hour  Intake 1387.41 ml  Output --  Net 1387.41 ml    Filed Weights   05/07/22 0155 05/08/22 0500 05/09/22 0543  Weight: 30.8 kg 30.4 kg 35.7 kg    Exam: General exam: In no acute distress, cachectic Respiratory system: Good air movement and clear to auscultation. Cardiovascular system: S1 & S2 heard, RRR. No JVD. Gastrointestinal system: Abdomen is nondistended, soft and nontender.  Extremities: No pedal edema. Skin: No rashes, lesions or ulcers   Data Reviewed:    Labs: Basic Metabolic Panel: Recent Labs  Lab 05/10/2022 2207 05/07/22 0314 05/08/22 0855 05/09/22 0335  NA 138 139 136  --   K 4.2 4.0 5.0  --   CL 102 106 105  --   CO2 26 24 23   --   GLUCOSE 96 86 85  --   BUN 66* 63* 56*  --   CREATININE 2.16* 1.86* 1.67*  --   CALCIUM 9.1 8.4* 8.7*  --   MG  --  1.5* 2.1 2.1  PHOS  --   --  3.2  --     GFR CrCl cannot be calculated (Unknown ideal weight.). Liver Function Tests: Recent Labs  Lab 04/26/2022 2207 05/07/22 0314  AST 122* 104*  ALT 144* 122*  ALKPHOS 106 104  BILITOT 0.7 0.5  PROT 6.2* 5.7*  ALBUMIN 3.4* 2.9*    No results for input(s): "LIPASE", "AMYLASE" in the last 168 hours. No results for input(s): "AMMONIA" in the last 168 hours. Coagulation profile Recent Labs  Lab 05/07/22 0314  INR 1.2    COVID-19 Labs  No results  for input(s): "DDIMER", "FERRITIN", "LDH", "CRP" in the last 72 hours.  Lab Results  Component Value Date   [place] NEGATIVE 07/19/2019    CBC: Recent Labs  Lab 04/30/2022 2207 05/07/22 0314 05/08/22 0855  WBC 4.9 4.4 3.2*  NEUTROABS 3.8  --  2.1  HGB 9.7* 8.4* 8.4*  HCT 29.8* 25.7* 25.2*  MCV 99.0 97.7 96.9  PLT 109* 89* 88*    Cardiac Enzymes: No results for input(s): "CKTOTAL", "CKMB", "CKMBINDEX", "TROPONINI" in the last 168 hours. BNP (last 3 results) No results for input(s): "PROBNP" in the last 8760 hours. CBG: No results for input(s): "GLUCAP" in the last 168 hours. D-Dimer: No results for input(s): "DDIMER" in the last 72 hours. Hgb A1c: No results for input(s): "HGBA1C" in the  last 72 hours. Lipid Profile: No results for input(s): "CHOL", "HDL", "LDLCALC", "TRIG", "CHOLHDL", "LDLDIRECT" in the last 72 hours. Thyroid function studies: No results for input(s): "TSH", "T4TOTAL", "T3FREE", "THYROIDAB" in the last 72 hours.  Invalid input(s): "FREET3" Anemia work up: No results for input(s): "VITAMINB12", "FOLATE", "FERRITIN", "TIBC", "IRON", "RETICCTPCT" in the last 72 hours. Sepsis Labs: Recent Labs  Lab 05/04/2022 2207 05/07/22 0314 05/08/22 0855  WBC 4.9 4.4 3.2*    Microbiology Recent Results (from the past 240 hour(s))  Urine Culture     Status: None   Collection Time: 04/20/2022 10:27 PM   Specimen: In/Out Cath Urine  Result Value Ref Range Status   Specimen Description IN/OUT CATH URINE  Final   Special Requests NONE  Final   Culture   Final    NO GROWTH Performed at [facility], [address] [place], [place] [address]    Report Status 05/07/2022 FINAL  Final  MRSA Next Gen by PCR, Nasal     Status: None   Collection Time: 05/07/22  2:09 AM   Specimen: Nasal Mucosa; Nasal Swab  Result Value Ref Range Status   MRSA by PCR Next Gen NOT DETECTED NOT DETECTED Final    Comment: (NOTE) The GeneXpert MRSA Assay (FDA approved for NASAL  specimens only), is one component of a comprehensive MRSA colonization surveillance program. It is not intended to diagnose MRSA infection nor to guide or monitor treatment for MRSA infections. Test performance is not FDA approved in patients less than 74 years old. Performed at [facility], [place] [address] [place], [place] [address]      Medications:    ARIPiprazole  5 mg Oral Daily   Chlorhexidine Gluconate Cloth  6 each Topical Daily   levETIRAcetam  250 mg Oral BID   mouth rinse  15 mL Mouth Rinse 4 times per day   QUEtiapine  12.5 mg Oral QHS   Continuous Infusions:  sodium chloride 75 mL/hr at 2022-06-09 0344      LOS: 4 days   Charlynne Cousins  [facility]  2022/06/09, 9:04 AM

## 2022-05-15 NOTE — Death Summary Note (Signed)
Death Summary  [name] MWN:[id number] DOB: 22-Mar-1935 DOA: 2022-05-07  PCP: Charolette Forward, MD  Admit date: May 07, 2022 Date of Death: 05/12/22 Time of Death: 1500 Notification: Charolette Forward, MD notified of death of May 12, 2022   History of present illness:  [name] is a 87 y.o. female with a history of  female past medical history of Alzheimer's, Crohn's disease, CAD status post MI, TIA left hip pressure ulcer found to be unresponsive drooling brought in by EMS CT was concerning for small subarachnoid hemorrhage and right frontal operculum, left posterior scalp hematoma without skull fracture admitted to PCCM  Final Diagnoses:  1.  Right frontal subarachnoid hemorrhage likely due to traumatic/with left posterior scalp hematoma no skull fracture/possible seizure: In the setting of a mechanical fall, CT showed small subarachnoid hemorrhage with a right frontal operculum left posterior scalp hematoma or skull fracture. Was started on Keppra. EEG was negative for seizures. Physical therapy evaluated the patient recommended skilled nursing facility but the family wanted to take her home. Patient is not a candidate for Plavix. She had a swallowing evaluation which shows severe high risk of aspiration Explained to the family severe dysphagia and the risk of aspiration the family related that they would like to meet with palliative care they would want her to have some type of food. The patient passed away as she bradycardia down. Several attempts were made to try to inform the family but we were unsuccessful.  Dysphagia:  she was having trouble swallowing she was evaluated by speech, who deemed her high risk of aspiration. I did explain to her son that she had a very poor prognosis. I tried to ca his siblings several times in which I was unsuccessful. Patient did meet briefly with Perative care she was made a DNR.  Alzheimer's dementia: Noted.  Essential  hypertension: Noted.  Pancytopenia: No signs of overt bleeding counts remain relatively stable.  Acute kidney injury on chronic kidney disease stage IIIb/hypokalemia/hypomagnesemia: Likely due to poor oral intake in the setting of diuretic therapy. She was fluid resuscitated her creatinine returned to baseline. Electrolytes were repleted.  Severe protein caloric malnutrition: Noted.    The results of significant diagnostics from this hospitalization (including imaging, microbiology, ancillary and laboratory) are listed below for reference.    Significant Diagnostic Studies: EEG adult  Result Date: 05/08/22 Lora Havens, MD     08-May-2022 10:05 AM Patient Name: [name] MRN: [id number] Epilepsy Attending: Lora Havens Referring Physician/Provider: Donnetta Simpers, MD Date: 05/08/2022 Duration: 23.33 mins Patient history: 87 y.o. female with PMH significant for alzheimers, TIA, Crohns, nephrolithiasis, HTN who presents with episode of starring off, R cheek tense and then somnolence. EEG to evaluate for seizure. Level of alertness: Awake, asleep AEDs during EEG study: LEV Technical aspects: This EEG study was done with scalp electrodes positioned according to the 10-20 International system of electrode placement. Electrical activity was acquired at a sampling rate of 500Hz  and reviewed with a high frequency filter of 70Hz  and a low frequency filter of 1Hz . EEG data were recorded continuously and digitally stored. Description: No clear posterior dominant rhythm was seen.  Sleep was characterized by vertex waves, sleep spindles (12 to 14 Hz), maximal frontocentral region. EEG showed continuous generalized 3 to 6 Hz theta-delta slowing. Hyperventilation and photic stimulation were not performed.   ABNORMALITY - Continuous slow, generalized IMPRESSION: This study is suggestive of moderate diffuse encephalopathy, nonspecific etiology. No seizures or epileptiform discharges were seen  throughout the  recording. [place]   CT HEAD WO CONTRAST (5MM)  Result Date: 05/07/2022 CLINICAL DATA:  Follow-up subarachnoid hemorrhage EXAM: CT HEAD WITHOUT CONTRAST TECHNIQUE: Contiguous axial images were obtained from the base of the skull through the vertex without intravenous contrast. RADIATION DOSE REDUCTION: This exam was performed according to the departmental dose-optimization program which includes automated exposure control, adjustment of the mA and/or kV according to patient size and/or use of iterative reconstruction technique. COMPARISON:  Yesterday FINDINGS: Brain: Unchanged small focus of subarachnoid hemorrhage at the anterior right insula. Background of advanced chronic small vessel ischemic gliosis and brain atrophy with focal left occipital lobe encephalomalacia. No infarct or obstructive hydrocephalus. Vascular: No hyperdense vessel or unexpected calcification. Skull: Scalp swelling without fracture. Sinuses/Orbits: No acute finding. IMPRESSION: Unchanged small volume subarachnoid hemorrhage. Electronically Signed   By: Jorje Guild M.D.   On: 05/07/2022 05:19   CT HEAD WO CONTRAST (5MM)  Result Date: 04/28/2022 CLINICAL DATA:  Altered mental status EXAM: CT HEAD WITHOUT CONTRAST TECHNIQUE: Contiguous axial images were obtained from the base of the skull through the vertex without intravenous contrast. RADIATION DOSE REDUCTION: This exam was performed according to the departmental dose-optimization program which includes automated exposure control, adjustment of the mA and/or kV according to patient size and/or use of iterative reconstruction technique. COMPARISON:  06/26/2017 FINDINGS: Brain: There is a small focus of subarachnoid hemorrhage underlying the right frontal operculum. No midline shift or mass effect. There is periventricular hypoattenuation compatible with chronic microvascular disease. Diffuse cerebral atrophy. Vascular: No abnormal hyperdensity of the major  intracranial arteries or dural venous sinuses. No intracranial atherosclerosis. Skull: Left posterior scalp hematoma.  No skull fracture. Sinuses/Orbits: No fluid levels or advanced mucosal thickening of the visualized paranasal sinuses. No mastoid or middle ear effusion. The orbits are normal. IMPRESSION: 1. Small focus of subarachnoid hemorrhage underlying the right frontal operculum, likely traumatic. 2. Left posterior scalp hematoma without skull fracture. Critical Value/emergent results were called by telephone at the time of interpretation on 04/30/2022 at 11:13 pm to provider DAVID YAO , who verbally acknowledged these results. Electronically Signed   By: Ulyses Jarred M.D.   On: 05/04/2022 23:23   DG Chest Port 1 View  Result Date: 05/01/2022 CLINICAL DATA:  Altered mental status. EXAM: PORTABLE CHEST 1 VIEW COMPARISON:  July 19, 2019 FINDINGS: The heart size and mediastinal contours are within normal limits. Both lungs are clear. The visualized skeletal structures are unremarkable. IMPRESSION: No active disease. Electronically Signed   By: Virgina Norfolk M.D.   On: 05/08/2022 22:18    Microbiology: Recent Results (from the past 240 hour(s))  Urine Culture     Status: None   Collection Time: 05/12/2022 10:27 PM   Specimen: In/Out Cath Urine  Result Value Ref Range Status   Specimen Description IN/OUT CATH URINE  Final   Special Requests NONE  Final   Culture   Final    NO GROWTH Performed at [facility], [address], [place], [place] [address]    Report Status 05/07/2022 FINAL  Final  MRSA Next Gen by PCR, Nasal     Status: None   Collection Time: 05/07/22  2:09 AM   Specimen: Nasal Mucosa; Nasal Swab  Result Value Ref Range Status   MRSA by PCR Next Gen NOT DETECTED NOT DETECTED Final    Comment: (NOTE) The GeneXpert MRSA Assay (FDA approved for NASAL specimens only), is one component of a comprehensive MRSA colonization surveillance program. It  is not intended to  diagnose MRSA infection nor to guide or monitor treatment for MRSA infections. Test performance is not FDA approved in patients less than 45 years old. Performed at [facility], [place] [address], [place], [place] [address]      Labs: Basic Metabolic Panel: Recent Labs  Lab 04/29/2022 2207 05/07/22 0314 05/08/22 0855 05/09/22 0335  NA 138 139 136  --   K 4.2 4.0 5.0  --   CL 102 106 105  --   CO2 26 24 23   --   GLUCOSE 96 86 85  --   BUN 66* 63* 56*  --   CREATININE 2.16* 1.86* 1.67*  --   CALCIUM 9.1 8.4* 8.7*  --   MG  --  1.5* 2.1 2.1  PHOS  --   --  3.2  --    Liver Function Tests: Recent Labs  Lab 05/01/2022 2207 05/07/22 0314  AST 122* 104*  ALT 144* 122*  ALKPHOS 106 104  BILITOT 0.7 0.5  PROT 6.2* 5.7*  ALBUMIN 3.4* 2.9*   No results for input(s): "LIPASE", "AMYLASE" in the last 168 hours. No results for input(s): "AMMONIA" in the last 168 hours. CBC: Recent Labs  Lab 04/15/2022 2207 05/07/22 0314 05/08/22 0855  WBC 4.9 4.4 3.2*  NEUTROABS 3.8  --  2.1  HGB 9.7* 8.4* 8.4*  HCT 29.8* 25.7* 25.2*  MCV 99.0 97.7 96.9  PLT 109* 89* 88*   Cardiac Enzymes: No results for input(s): "CKTOTAL", "CKMB", "CKMBINDEX", "TROPONINI" in the last 168 hours. D-Dimer No results for input(s): "DDIMER" in the last 72 hours. BNP: Invalid input(s): "POCBNP" CBG: No results for input(s): "GLUCAP" in the last 168 hours. Anemia work up No results for input(s): "VITAMINB12", "FOLATE", "FERRITIN", "TIBC", "IRON", "RETICCTPCT" in the last 72 hours. Urinalysis    Component Value Date/Time   COLORURINE STRAW (A) 04/16/2022 2119   APPEARANCEUR CLEAR 04/16/2022 2119   LABSPEC 1.008 05/09/2022 2119   PHURINE 6.0 04/20/2022 2119   GLUCOSEU NEGATIVE 04/22/2022 2119   HGBUR NEGATIVE 05/09/2022 2119   BILIRUBINUR NEGATIVE 04/20/2022 2119   KETONESUR NEGATIVE 04/20/2022 2119   PROTEINUR NEGATIVE 04/27/2022 2119   UROBILINOGEN 0.2 04/23/2013 1957   NITRITE NEGATIVE  05/14/2022 2119   LEUKOCYTESUR NEGATIVE 05/10/2022 2119   Sepsis Labs Recent Labs  Lab 04/22/2022 2207 05/07/22 0314 05/08/22 0855  WBC 4.9 4.4 3.2*       SIGNED:  Charlynne Cousins, MD  [facility] 21-May-2022, 4:54 PM Pager   If 7PM-7AM, please contact night-coverage [email or web address] Password [email or web address]

## 2022-05-15 NOTE — Care Management Important Message (Signed)
Important Message  Patient Details  Name: [name] MRN: [id number] Date of Birth: Mar 02, 1935   Medicare Important Message Given:  Yes  Patient left prior to IM delivery will mail to the patient home address.     Earnest Thalman 05-17-22, 3:53 PM

## 2022-05-15 NOTE — Progress Notes (Signed)
This chaplain responded to PMT RN-Haley referral for family spiritual care after the Pt. death. The Pt. son-[name] and daughter in law are at the bedside.   The chaplain opened the door for story telling with the family as a place of grief care in the Pt. unexpected death. 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   [name] is contacting family and will update the RN with their plans. The chaplain invited [name] to request a chaplain presence through the RN as needed. The chaplain prayed with family and blessed the Pt. story.  Chaplain Sallyanne Kuster [phone]

## 2022-05-15 DEATH — deceased

## 2022-07-05 ENCOUNTER — Encounter (HOSPITAL_BASED_OUTPATIENT_CLINIC_OR_DEPARTMENT_OTHER): Payer: Medicare Other | Admitting: Internal Medicine
